# Patient Record
Sex: Female | Born: 1960 | Race: Black or African American | Hispanic: No | Marital: Single | Smoking: Current some day smoker
Health system: Southern US, Community
[De-identification: ages and names within clinical notes are randomized; demographics above are authoritative.]

## PROBLEM LIST (undated history)

## (undated) DIAGNOSIS — K5909 Other constipation: Secondary | ICD-10-CM

## (undated) DIAGNOSIS — Z9889 Other specified postprocedural states: Secondary | ICD-10-CM

## (undated) DIAGNOSIS — J449 Chronic obstructive pulmonary disease, unspecified: Secondary | ICD-10-CM

## (undated) DIAGNOSIS — E7800 Pure hypercholesterolemia, unspecified: Secondary | ICD-10-CM

## (undated) DIAGNOSIS — I10 Essential (primary) hypertension: Secondary | ICD-10-CM

## (undated) DIAGNOSIS — E781 Pure hyperglyceridemia: Secondary | ICD-10-CM

## (undated) DIAGNOSIS — K219 Gastro-esophageal reflux disease without esophagitis: Secondary | ICD-10-CM

## (undated) DIAGNOSIS — E559 Vitamin D deficiency, unspecified: Secondary | ICD-10-CM

## (undated) DIAGNOSIS — Z973 Presence of spectacles and contact lenses: Secondary | ICD-10-CM

## (undated) DIAGNOSIS — J45909 Unspecified asthma, uncomplicated: Secondary | ICD-10-CM

## (undated) HISTORY — PX: TONSILLECTOMY: SUR1361

## (undated) HISTORY — DX: Pure hyperglyceridemia: E78.1

## (undated) HISTORY — DX: Gastro-esophageal reflux disease without esophagitis: K21.9

## (undated) HISTORY — DX: Vitamin D deficiency, unspecified: E55.9

## (undated) HISTORY — PX: COLONOSCOPY: SHX174

## (undated) HISTORY — DX: Unspecified asthma, uncomplicated: J45.909

## (undated) HISTORY — DX: Chronic obstructive pulmonary disease, unspecified: J44.9

---

## 1997-09-27 ENCOUNTER — Emergency Department (HOSPITAL_COMMUNITY): Admission: EM | Admit: 1997-09-27 | Discharge: 1997-09-27 | Payer: Self-pay | Admitting: Emergency Medicine

## 1997-10-27 ENCOUNTER — Emergency Department (HOSPITAL_COMMUNITY): Admission: EM | Admit: 1997-10-27 | Discharge: 1997-10-27 | Payer: Self-pay | Admitting: Emergency Medicine

## 1999-02-15 HISTORY — PX: ORBITAL FRACTURE SURGERY: SHX725

## 1999-06-16 ENCOUNTER — Encounter: Payer: Self-pay | Admitting: Emergency Medicine

## 1999-06-16 ENCOUNTER — Emergency Department (HOSPITAL_COMMUNITY): Admission: EM | Admit: 1999-06-16 | Discharge: 1999-06-16 | Payer: Self-pay | Admitting: Emergency Medicine

## 2000-01-19 ENCOUNTER — Encounter: Payer: Self-pay | Admitting: Emergency Medicine

## 2000-01-19 ENCOUNTER — Emergency Department (HOSPITAL_COMMUNITY): Admission: EM | Admit: 2000-01-19 | Discharge: 2000-01-19 | Payer: Self-pay | Admitting: Emergency Medicine

## 2000-07-19 ENCOUNTER — Emergency Department (HOSPITAL_COMMUNITY): Admission: EM | Admit: 2000-07-19 | Discharge: 2000-07-19 | Payer: Self-pay | Admitting: *Deleted

## 2000-08-26 ENCOUNTER — Emergency Department (HOSPITAL_COMMUNITY): Admission: EM | Admit: 2000-08-26 | Discharge: 2000-08-26 | Payer: Self-pay

## 2001-05-26 ENCOUNTER — Encounter: Payer: Self-pay | Admitting: Emergency Medicine

## 2001-05-26 ENCOUNTER — Emergency Department (HOSPITAL_COMMUNITY): Admission: EM | Admit: 2001-05-26 | Discharge: 2001-05-27 | Payer: Self-pay | Admitting: Emergency Medicine

## 2001-05-27 ENCOUNTER — Encounter: Payer: Self-pay | Admitting: Emergency Medicine

## 2001-10-06 ENCOUNTER — Emergency Department (HOSPITAL_COMMUNITY): Admission: EM | Admit: 2001-10-06 | Discharge: 2001-10-06 | Payer: Self-pay | Admitting: Emergency Medicine

## 2001-10-06 ENCOUNTER — Encounter: Payer: Self-pay | Admitting: Emergency Medicine

## 2002-03-06 ENCOUNTER — Encounter: Payer: Self-pay | Admitting: Emergency Medicine

## 2002-03-06 ENCOUNTER — Emergency Department (HOSPITAL_COMMUNITY): Admission: EM | Admit: 2002-03-06 | Discharge: 2002-03-06 | Payer: Self-pay | Admitting: Emergency Medicine

## 2003-03-12 ENCOUNTER — Emergency Department (HOSPITAL_COMMUNITY): Admission: AD | Admit: 2003-03-12 | Discharge: 2003-03-12 | Payer: Self-pay | Admitting: Emergency Medicine

## 2005-01-29 ENCOUNTER — Emergency Department (HOSPITAL_COMMUNITY): Admission: EM | Admit: 2005-01-29 | Discharge: 2005-01-29 | Payer: Self-pay | Admitting: Emergency Medicine

## 2005-02-04 ENCOUNTER — Emergency Department (HOSPITAL_COMMUNITY): Admission: EM | Admit: 2005-02-04 | Discharge: 2005-02-04 | Payer: Self-pay | Admitting: *Deleted

## 2005-02-05 ENCOUNTER — Emergency Department (HOSPITAL_COMMUNITY): Admission: EM | Admit: 2005-02-05 | Discharge: 2005-02-05 | Payer: Self-pay | Admitting: *Deleted

## 2005-02-09 ENCOUNTER — Emergency Department (HOSPITAL_COMMUNITY): Admission: EM | Admit: 2005-02-09 | Discharge: 2005-02-10 | Payer: Self-pay | Admitting: Emergency Medicine

## 2005-02-18 ENCOUNTER — Emergency Department (HOSPITAL_COMMUNITY): Admission: EM | Admit: 2005-02-18 | Discharge: 2005-02-18 | Payer: Self-pay | Admitting: Emergency Medicine

## 2005-03-30 ENCOUNTER — Encounter: Admission: RE | Admit: 2005-03-30 | Discharge: 2005-03-30 | Payer: Self-pay | Admitting: Otolaryngology

## 2005-04-07 ENCOUNTER — Emergency Department (HOSPITAL_COMMUNITY): Admission: EM | Admit: 2005-04-07 | Discharge: 2005-04-07 | Payer: Self-pay | Admitting: Emergency Medicine

## 2005-12-28 ENCOUNTER — Emergency Department (HOSPITAL_COMMUNITY): Admission: EM | Admit: 2005-12-28 | Discharge: 2005-12-28 | Payer: Self-pay | Admitting: Emergency Medicine

## 2006-06-22 ENCOUNTER — Emergency Department (HOSPITAL_COMMUNITY): Admission: EM | Admit: 2006-06-22 | Discharge: 2006-06-22 | Payer: Self-pay | Admitting: Emergency Medicine

## 2006-09-18 ENCOUNTER — Emergency Department (HOSPITAL_COMMUNITY): Admission: EM | Admit: 2006-09-18 | Discharge: 2006-09-18 | Payer: Self-pay | Admitting: Emergency Medicine

## 2007-04-25 ENCOUNTER — Emergency Department (HOSPITAL_COMMUNITY): Admission: EM | Admit: 2007-04-25 | Discharge: 2007-04-25 | Payer: Self-pay | Admitting: Emergency Medicine

## 2008-04-28 ENCOUNTER — Encounter: Admission: RE | Admit: 2008-04-28 | Discharge: 2008-04-28 | Payer: Self-pay | Admitting: Internal Medicine

## 2008-05-05 ENCOUNTER — Encounter: Admission: RE | Admit: 2008-05-05 | Discharge: 2008-05-05 | Payer: Self-pay | Admitting: Internal Medicine

## 2008-10-22 ENCOUNTER — Encounter (INDEPENDENT_AMBULATORY_CARE_PROVIDER_SITE_OTHER): Payer: Self-pay | Admitting: Obstetrics

## 2008-10-22 ENCOUNTER — Ambulatory Visit (HOSPITAL_COMMUNITY): Admission: RE | Admit: 2008-10-22 | Discharge: 2008-10-22 | Payer: Self-pay | Admitting: Obstetrics

## 2009-11-20 ENCOUNTER — Encounter: Admission: RE | Admit: 2009-11-20 | Discharge: 2009-11-20 | Payer: Self-pay | Admitting: Family Medicine

## 2009-12-04 ENCOUNTER — Emergency Department (HOSPITAL_COMMUNITY): Admission: EM | Admit: 2009-12-04 | Discharge: 2009-12-04 | Payer: Self-pay | Admitting: Emergency Medicine

## 2010-01-27 ENCOUNTER — Emergency Department (HOSPITAL_COMMUNITY)
Admission: EM | Admit: 2010-01-27 | Discharge: 2010-01-27 | Payer: Self-pay | Source: Home / Self Care | Admitting: Emergency Medicine

## 2010-02-27 ENCOUNTER — Emergency Department (HOSPITAL_COMMUNITY)
Admission: EM | Admit: 2010-02-27 | Discharge: 2010-02-27 | Payer: Self-pay | Source: Home / Self Care | Admitting: Emergency Medicine

## 2010-03-15 ENCOUNTER — Emergency Department (HOSPITAL_COMMUNITY)
Admission: EM | Admit: 2010-03-15 | Discharge: 2010-03-16 | Payer: Self-pay | Source: Home / Self Care | Admitting: Emergency Medicine

## 2010-05-21 LAB — CBC
HCT: 31 % — ABNORMAL LOW (ref 36.0–46.0)
Hemoglobin: 10.6 g/dL — ABNORMAL LOW (ref 12.0–15.0)
RBC: 3.61 MIL/uL — ABNORMAL LOW (ref 3.87–5.11)

## 2010-06-23 ENCOUNTER — Other Ambulatory Visit: Payer: Self-pay | Admitting: Family Medicine

## 2010-06-23 ENCOUNTER — Ambulatory Visit
Admission: RE | Admit: 2010-06-23 | Discharge: 2010-06-23 | Disposition: A | Discharge status: Home / Self Care | Payer: Medicaid Other | Source: Ambulatory Visit | Attending: Family Medicine | Admitting: Family Medicine

## 2010-06-23 DIAGNOSIS — R1084 Generalized abdominal pain: Secondary | ICD-10-CM

## 2010-06-23 DIAGNOSIS — K5900 Constipation, unspecified: Secondary | ICD-10-CM

## 2010-06-23 DIAGNOSIS — R634 Abnormal weight loss: Secondary | ICD-10-CM

## 2010-06-23 DIAGNOSIS — K6289 Other specified diseases of anus and rectum: Secondary | ICD-10-CM

## 2010-10-23 ENCOUNTER — Emergency Department (HOSPITAL_COMMUNITY): Payer: Medicaid Other

## 2010-10-23 ENCOUNTER — Emergency Department (HOSPITAL_COMMUNITY)
Admission: EM | Admit: 2010-10-23 | Discharge: 2010-10-23 | Disposition: A | Discharge status: Home / Self Care | Payer: Medicaid Other | Attending: Emergency Medicine | Admitting: Emergency Medicine

## 2010-10-23 DIAGNOSIS — R071 Chest pain on breathing: Secondary | ICD-10-CM | POA: Insufficient documentation

## 2010-10-23 DIAGNOSIS — M549 Dorsalgia, unspecified: Secondary | ICD-10-CM | POA: Insufficient documentation

## 2010-10-23 DIAGNOSIS — F411 Generalized anxiety disorder: Secondary | ICD-10-CM | POA: Insufficient documentation

## 2010-10-23 DIAGNOSIS — G8929 Other chronic pain: Secondary | ICD-10-CM | POA: Insufficient documentation

## 2010-10-23 LAB — POCT I-STAT TROPONIN I: Troponin i, poc: 0 ng/mL (ref 0.00–0.08)

## 2010-10-23 LAB — DIFFERENTIAL
Basophils Absolute: 0 10*3/uL (ref 0.0–0.1)
Basophils Relative: 0 % (ref 0–1)
Eosinophils Absolute: 0.1 10*3/uL (ref 0.0–0.7)
Eosinophils Relative: 1 % (ref 0–5)
Neutrophils Relative %: 60 % (ref 43–77)

## 2010-10-23 LAB — D-DIMER, QUANTITATIVE: D-Dimer, Quant: 0.28 ug/mL-FEU (ref 0.00–0.48)

## 2010-10-23 LAB — CBC
Platelets: 260 10*3/uL (ref 150–400)
RDW: 15 % (ref 11.5–15.5)
WBC: 6 10*3/uL (ref 4.0–10.5)

## 2010-10-23 LAB — POCT I-STAT, CHEM 8
Calcium, Ion: 1.16 mmol/L (ref 1.12–1.32)
Glucose, Bld: 95 mg/dL (ref 70–99)
HCT: 36 % (ref 36.0–46.0)
Hemoglobin: 12.2 g/dL (ref 12.0–15.0)

## 2010-11-08 LAB — POCT CARDIAC MARKERS
CKMB, poc: <1 — ABNORMAL LOW
Myoglobin, poc: 26.8
Troponin i, poc: <0.05

## 2010-11-08 LAB — I-STAT 8, (EC8 V) (CONVERTED LAB)
Acid-base deficit: 4 — ABNORMAL HIGH
Bicarbonate: 20.4
HCT: 38
Operator id: 261381
pCO2, Ven: 33.6 — ABNORMAL LOW
pH, Ven: 7.393 — ABNORMAL HIGH

## 2010-11-08 LAB — D-DIMER, QUANTITATIVE: D-Dimer, Quant: <0.22

## 2010-11-08 LAB — POCT I-STAT CREATININE: Creatinine, Ser: 0.6

## 2010-11-08 LAB — ETHANOL: Alcohol, Ethyl (B): 256 — ABNORMAL HIGH

## 2010-11-15 ENCOUNTER — Other Ambulatory Visit: Payer: Self-pay | Admitting: Gastroenterology

## 2010-11-17 ENCOUNTER — Ambulatory Visit
Admission: RE | Admit: 2010-11-17 | Discharge: 2010-11-17 | Disposition: A | Discharge status: Home / Self Care | Payer: Medicaid Other | Source: Ambulatory Visit | Attending: Gastroenterology | Admitting: Gastroenterology

## 2010-11-17 MED ORDER — IOHEXOL 300 MG/ML  SOLN
75.0000 mL | Freq: Once | INTRAMUSCULAR | Status: AC | PRN
  Administered 2010-11-17: 75 mL via INTRAVENOUS

## 2010-11-20 ENCOUNTER — Emergency Department (HOSPITAL_COMMUNITY)
Admission: EM | Admit: 2010-11-20 | Discharge: 2010-11-20 | Discharge status: Against Medical Advice | Payer: Medicaid Other | Attending: Emergency Medicine | Admitting: Emergency Medicine

## 2010-11-20 DIAGNOSIS — R0789 Other chest pain: Secondary | ICD-10-CM | POA: Insufficient documentation

## 2010-11-22 ENCOUNTER — Other Ambulatory Visit: Payer: Self-pay | Admitting: Gastroenterology

## 2011-02-15 DIAGNOSIS — Z9889 Other specified postprocedural states: Secondary | ICD-10-CM

## 2011-02-15 HISTORY — DX: Other specified postprocedural states: Z98.890

## 2011-06-13 ENCOUNTER — Emergency Department (HOSPITAL_COMMUNITY)
Admission: EM | Admit: 2011-06-13 | Discharge: 2011-06-13 | Disposition: A | Discharge status: Home / Self Care | Payer: Medicaid Other | Attending: Emergency Medicine | Admitting: Emergency Medicine

## 2011-06-13 ENCOUNTER — Encounter (HOSPITAL_COMMUNITY): Payer: Self-pay | Admitting: Emergency Medicine

## 2011-06-13 ENCOUNTER — Emergency Department (HOSPITAL_COMMUNITY): Payer: Medicaid Other

## 2011-06-13 DIAGNOSIS — R0789 Other chest pain: Secondary | ICD-10-CM | POA: Insufficient documentation

## 2011-06-13 DIAGNOSIS — F3289 Other specified depressive episodes: Secondary | ICD-10-CM | POA: Insufficient documentation

## 2011-06-13 DIAGNOSIS — F329 Major depressive disorder, single episode, unspecified: Secondary | ICD-10-CM | POA: Insufficient documentation

## 2011-06-13 LAB — COMPREHENSIVE METABOLIC PANEL
ALT: 23 U/L (ref 0–35)
AST: 47 U/L — ABNORMAL HIGH (ref 0–37)
Albumin: 3.9 g/dL (ref 3.5–5.2)
Alkaline Phosphatase: 82 U/L (ref 39–117)
Calcium: 9.6 mg/dL (ref 8.4–10.5)
GFR calc Af Amer: >90 mL/min (ref >90)
Glucose, Bld: 82 mg/dL (ref 70–99)
Potassium: 3.5 mEq/L (ref 3.5–5.1)
Sodium: 135 mEq/L (ref 135–145)
Total Protein: 8.4 g/dL — ABNORMAL HIGH (ref 6.0–8.3)

## 2011-06-13 LAB — DIFFERENTIAL
Basophils Relative: 1 % (ref 0–1)
Eosinophils Absolute: 0.1 10*3/uL (ref 0.0–0.7)
Monocytes Relative: 11 % (ref 3–12)
Neutro Abs: 2.6 10*3/uL (ref 1.7–7.7)
Neutrophils Relative %: 47 % (ref 43–77)

## 2011-06-13 LAB — CBC
Hemoglobin: 11 g/dL — ABNORMAL LOW (ref 12.0–15.0)
MCH: 26.9 pg (ref 26.0–34.0)
MCHC: 32.1 g/dL (ref 30.0–36.0)
Platelets: 289 10*3/uL (ref 150–400)
RBC: 4.09 MIL/uL (ref 3.87–5.11)

## 2011-06-13 LAB — TROPONIN I: Troponin I: <0.3 ng/mL (ref <0.30)

## 2011-06-13 MED ORDER — METHOCARBAMOL 500 MG PO TABS
500.0000 mg | ORAL_TABLET | Freq: Once | ORAL | Status: AC
  Administered 2011-06-13: 500 mg via ORAL
  Filled 2011-06-13: qty 1

## 2011-06-13 MED ORDER — DICLOFENAC SODIUM 75 MG PO TBEC: 75.0000 mg | DELAYED_RELEASE_TABLET | Freq: Two times a day (BID) | ORAL | Status: DC

## 2011-06-13 MED ORDER — KETOROLAC TROMETHAMINE 60 MG/2ML IM SOLN
60.0000 mg | Freq: Once | INTRAMUSCULAR | Status: DC
  Filled 2011-06-13: qty 2

## 2011-06-13 MED ORDER — DEXAMETHASONE SODIUM PHOSPHATE 10 MG/ML IJ SOLN
10.0000 mg | Freq: Once | INTRAMUSCULAR | Status: DC
  Filled 2011-06-13: qty 1

## 2011-06-13 MED ORDER — CYCLOBENZAPRINE HCL 10 MG PO TABS: 10.0000 mg | ORAL_TABLET | Freq: Two times a day (BID) | ORAL | Status: AC | PRN

## 2011-06-13 NOTE — ED Provider Notes (Addendum)
History     CSN: [id number]  Arrival date & time 06/13/11  1403   First MD Initiated Contact with Patient 06/13/11 1553     4:15 PM HPI Patient reports chest pain that began 3 days ago while watching TV. States pain has been constant for 3 days. Describes pain as left-sided, sharp, stabbing pains. Reports symptoms associated with shortness of breath. States chest pain is worse with palpation. Reports pain radiates to her left arm and left lower extremity. Patient has a history smoking. Denies history of hypertension, hyperlipidemia, family history of early MI, diabetes, recent travel, recent surgery, history of PE or DVT.  Patient is a 51 y.o. female presenting with chest pain. The history is provided by the patient.  Chest Pain Episode onset: 3 days ago. Chest pain occurs constantly. The chest pain is worsening. The severity of the pain is severe. The quality of the pain is described as sharp and stabbing. Radiates to: Left arm and left leg. Exacerbated by: palpation. Primary symptoms include shortness of breath. Pertinent negatives for primary symptoms include no fever, no fatigue, no cough, no wheezing, no palpitations, no abdominal pain, no nausea, no vomiting, no dizziness and no altered mental status.  Pertinent negatives for associated symptoms include no claudication, no diaphoresis, no lower extremity edema, no near-syncope, no numbness, no orthopnea and no weakness. She tried nothing for the symptoms. Risk factors include smoking/tobacco exposure.  Pertinent negatives for past medical history include no CHF, no diabetes, no DVT, no hyperlipidemia, no hypertension, no MI, no PE, no strokes and no TIA.  Pertinent negatives for family medical history include: no early MI in family.     Past Medical History  Diagnosis Date  . Depression     History reviewed. No pertinent past surgical history.  History reviewed. No pertinent family history.  History  Substance Use Topics  .  Smoking status: Current Everyday Smoker  . Smokeless tobacco: Not on file  . Alcohol Use: Yes    OB History    Grav Para Term Preterm Abortions TAB SAB Ect Mult Living                  Review of Systems  Constitutional: Negative for fever, diaphoresis and fatigue.  HENT: Negative for neck pain.   Respiratory: Positive for shortness of breath. Negative for cough and wheezing.   Cardiovascular: Positive for chest pain. Negative for palpitations, orthopnea, claudication, leg swelling and near-syncope.  Gastrointestinal: Negative for nausea, vomiting and abdominal pain.  Neurological: Negative for dizziness, weakness, numbness and headaches.  Psychiatric/Behavioral: Negative for altered mental status.  All other systems reviewed and are negative.    Allergies  Review of patient's allergies indicates no known allergies.  Home Medications   Current Outpatient Rx  Name Route Sig Dispense Refill  . ASPIRIN-ACETAMINOPHEN-CAFFEINE 250-250-65 MG PO TABS Oral Take 1 tablet by mouth every 6 (six) hours as needed. For migraine      BP 131/72  Pulse 80  Temp 98.2 F (36.8 C)  Resp 22  SpO2 100%  Physical Exam  Vitals reviewed. Constitutional: She is oriented to person, place, and time. Vital signs are normal. She appears well-developed and well-nourished.  HENT:  Head: Normocephalic and atraumatic.  Eyes: Conjunctivae are normal. Pupils are equal, round, and reactive to light.  Neck: Normal range of motion. Neck supple.  Cardiovascular: Normal rate, regular rhythm and normal heart sounds.  Exam reveals no friction rub.   No murmur heard. Pulmonary/Chest: Effort normal  and breath sounds normal. She has no wheezes. She has no rhonchi. She has no rales. She exhibits tenderness.       Reproducible chest pain with palpation of entire left chest and shoulder. Otherwise normal exam.  Musculoskeletal: Normal range of motion.  Neurological: She is alert and oriented to person, place, and  time. Coordination normal.  Skin: Skin is warm and dry. No rash noted. No erythema. No pallor.    ED Course  Procedures   Results for orders placed during the hospital encounter of 06/13/11  CBC      Component Value Range   WBC 5.5  4.0 - 10.5 (K/uL)   RBC 4.09  3.87 - 5.11 (MIL/uL)   Hemoglobin 11.0 (*) 12.0 - 15.0 (g/dL)   HCT 40.9 (*) 81.1 - 46.0 (%)   MCV 83.9  78.0 - 100.0 (fL)   MCH 26.9  26.0 - 34.0 (pg)   MCHC 32.1  30.0 - 36.0 (g/dL)   RDW 91.4 (*) 78.2 - 15.5 (%)   Platelets 289  150 - 400 (K/uL)  DIFFERENTIAL      Component Value Range   Neutrophils Relative 47  43 - 77 (%)   Neutro Abs 2.6  1.7 - 7.7 (K/uL)   Lymphocytes Relative 41  12 - 46 (%)   Lymphs Abs 2.3  0.7 - 4.0 (K/uL)   Monocytes Relative 11  3 - 12 (%)   Monocytes Absolute 0.6  0.1 - 1.0 (K/uL)   Eosinophils Relative 1  0 - 5 (%)   Eosinophils Absolute 0.1  0.0 - 0.7 (K/uL)   Basophils Relative 1  0 - 1 (%)   Basophils Absolute 0.0  0.0 - 0.1 (K/uL)  TROPONIN I      Component Value Range   Troponin I <0.30  <0.30 (ng/mL)  COMPREHENSIVE METABOLIC PANEL      Component Value Range   Sodium 135  135 - 145 (mEq/L)   Potassium 3.5  3.5 - 5.1 (mEq/L)   Chloride 99  96 - 112 (mEq/L)   CO2 22  19 - 32 (mEq/L)   Glucose, Bld 82  70 - 99 (mg/dL)   BUN 5 (*) 6 - 23 (mg/dL)   Creatinine, Ser 9.56 (*) 0.50 - 1.10 (mg/dL)   Calcium 9.6  8.4 - 21.3 (mg/dL)   Total Protein 8.4 (*) 6.0 - 8.3 (g/dL)   Albumin 3.9  3.5 - 5.2 (g/dL)   AST 47 (*) 0 - 37 (U/L)   ALT 23  0 - 35 (U/L)   Alkaline Phosphatase 82  39 - 117 (U/L)   Total Bilirubin 0.3  0.3 - 1.2 (mg/dL)   GFR calc non Af Amer >90  >90 (mL/min)   GFR calc Af Amer >90  >90 (mL/min)   Dg Chest 2 View  06/13/2011  *RADIOLOGY REPORT*  Clinical Data: Left-sided chest pain and shortness of breath for the past 3 days.  Left arm numbness as well.  CHEST - 2 VIEW  Comparison: Chest x-ray 10/23/2010.  Findings: Lungs are mildly hyperexpanded with flattening of  the hemidiaphragms and increased retrosternal air space.  No focal airspace consolidation.  No definite pleural effusions.  Pulmonary vasculature and the cardiomediastinal silhouette are within normal limits.  IMPRESSION: 1.  Mild hyperexpansion without other acute findings.  This is nonspecific, but can be seen in the setting of reactive airway disease.  Original Report Authenticated By: Florencia Reasons, M.D.     Date: 06/13/2011  Rate: 78  Rhythm: normal sinus rhythm  QRS Axis: normal  Intervals: normal  ST/T Wave abnormalities: normal  Conduction Disutrbances: none   Old EKG Reviewed: No significant changes noted     MDM   Patient reports constant pain for the last 3 days. Vital signs are stable. The patient does not have risk factors for ACS patient is also Perc negative suspect pain is musculoskeletal since patient's pain is severe he easily reproducible with palpation. I attempted to discuss this with patient but patient does not want to accept that her labs, EKG, and x-ray were normal advised patient that we will treat her with Toradol, Decadron and Robaxin. Will give her similar medications to go home on, diclofenac, and Robaxin. Reviewed prior chart for patient, which states she is allergic to ibuprofen but states she has taken Aleve in the past. He has had prescription for naproxen. Patient is ready for discharge. Discussed plan with Dr. Lowella Bandy, PA-C 06/13/11 1714  Thomasene Lot, PA-C 06/13/11 [phone]

## 2011-06-13 NOTE — ED Provider Notes (Signed)
Medical screening examination/treatment/procedure(s) were performed by non-physician practitioner and as supervising physician I was immediately available for consultation/collaboration.  Ethelda Chick, MD 06/13/11 1725

## 2011-06-13 NOTE — ED Provider Notes (Signed)
Medical screening examination/treatment/procedure(s) were performed by non-physician practitioner and as supervising physician I was immediately available for consultation/collaboration.  Ethelda Chick, MD 06/13/11 1911

## 2011-06-13 NOTE — ED Notes (Signed)
Chest pain left sided that comes and goes hurts down to leg and ankle

## 2011-06-13 NOTE — ED Notes (Signed)
Pt reports pain on the (L) side x 3 days.  States that it is worsening today.  Pt reports swelling on the (L) side also, no swelling noted.  Pt reports tenderness on the (L) side-chest, neck arm and leg.  Denies pain in ankle and foot.  CVA screen negative, no drift, droop noted, PERRLA.

## 2011-06-13 NOTE — Discharge Instructions (Signed)
Chest Pain (Nonspecific) It is often hard to give a specific diagnosis for the cause of chest pain. There is always a chance that your pain could be related to something serious, such as a heart attack or a blood clot in the lungs. You need to follow up with your caregiver for further evaluation. CAUSES   Heartburn.   Pneumonia or bronchitis.   Anxiety or stress.   Inflammation around your heart (pericarditis) or lung (pleuritis or pleurisy).   A blood clot in the lung.   A collapsed lung (pneumothorax). It can develop suddenly on its own (spontaneous pneumothorax) or from injury (trauma) to the chest.   Shingles infection (herpes zoster virus).  The chest wall is composed of bones, muscles, and cartilage. Any of these can be the source of the pain.  The bones can be bruised by injury.   The muscles or cartilage can be strained by coughing or overwork.   The cartilage can be affected by inflammation and become sore (costochondritis).  DIAGNOSIS  Lab tests or other studies, such as X-rays, electrocardiography, stress testing, or cardiac imaging, may be needed to find the cause of your pain.  TREATMENT   Treatment depends on what may be causing your chest pain. Treatment may include:   Acid blockers for heartburn.   Anti-inflammatory medicine.   Pain medicine for inflammatory conditions.   Antibiotics if an infection is present.   You may be advised to change lifestyle habits. This includes stopping smoking and avoiding alcohol, caffeine, and chocolate.   You may be advised to keep your head raised (elevated) when sleeping. This reduces the chance of acid going backward from your stomach into your esophagus.   Most of the time, nonspecific chest pain will improve within 2 to 3 days with rest and mild pain medicine.  HOME CARE INSTRUCTIONS   If antibiotics were prescribed, take your antibiotics as directed. Finish them even if you start to feel better.   For the next few  days, avoid physical activities that bring on chest pain. Continue physical activities as directed.   Do not smoke.   Avoid drinking alcohol.   Only take over-the-counter or prescription medicine for pain, discomfort, or fever as directed by your caregiver.   Follow your caregiver's suggestions for further testing if your chest pain does not go away.   Keep any follow-up appointments you made. If you do not go to an appointment, you could develop lasting (chronic) problems with pain. If there is any problem keeping an appointment, you must call to reschedule.  SEEK MEDICAL CARE IF:   You think you are having problems from the medicine you are taking. Read your medicine instructions carefully.   Your chest pain does not go away, even after treatment.   You develop a rash with blisters on your chest.  SEEK IMMEDIATE MEDICAL CARE IF:   You have increased chest pain or pain that spreads to your arm, neck, jaw, back, or abdomen.   You develop shortness of breath, an increasing cough, or you are coughing up blood.   You have severe back or abdominal pain, feel nauseous, or vomit.   You develop severe weakness, fainting, or chills.   You have a fever.  THIS IS AN EMERGENCY. Do not wait to see if the pain will go away. Get medical help at once. Call your local emergency services (911 in [place] Do not drive yourself to the hospital. MAKE SURE YOU:   Understand these instructions.     Will watch your condition.   Will get help right away if you are not doing well or get worse.  Document Released: 11/10/2004 Document Revised: 01/20/2011 Document Reviewed: 09/06/2007 [employer] Patient Information 2012 [employer]. 

## 2011-11-02 ENCOUNTER — Ambulatory Visit (HOSPITAL_COMMUNITY)
Admission: RE | Admit: 2011-11-02 | Discharge: 2011-11-02 | Disposition: A | Discharge status: Home / Self Care | Payer: Medicaid Other | Source: Ambulatory Visit | Attending: Internal Medicine | Admitting: Internal Medicine

## 2011-11-02 ENCOUNTER — Other Ambulatory Visit (HOSPITAL_COMMUNITY): Payer: Self-pay | Admitting: Internal Medicine

## 2011-11-02 DIAGNOSIS — M545 Low back pain, unspecified: Secondary | ICD-10-CM | POA: Insufficient documentation

## 2011-11-02 DIAGNOSIS — M25559 Pain in unspecified hip: Secondary | ICD-10-CM | POA: Insufficient documentation

## 2011-11-02 DIAGNOSIS — R52 Pain, unspecified: Secondary | ICD-10-CM

## 2011-12-21 ENCOUNTER — Other Ambulatory Visit: Payer: Self-pay | Admitting: Internal Medicine

## 2011-12-21 DIAGNOSIS — N63 Unspecified lump in unspecified breast: Secondary | ICD-10-CM

## 2011-12-23 ENCOUNTER — Emergency Department (HOSPITAL_COMMUNITY)
Admission: EM | Admit: 2011-12-23 | Discharge: 2011-12-23 | Disposition: A | Discharge status: Home / Self Care | Payer: Medicaid Other | Attending: Emergency Medicine | Admitting: Emergency Medicine

## 2011-12-23 ENCOUNTER — Encounter (HOSPITAL_COMMUNITY): Payer: Self-pay | Admitting: Emergency Medicine

## 2011-12-23 ENCOUNTER — Emergency Department (HOSPITAL_COMMUNITY): Payer: Medicaid Other

## 2011-12-23 DIAGNOSIS — Z8659 Personal history of other mental and behavioral disorders: Secondary | ICD-10-CM | POA: Insufficient documentation

## 2011-12-23 DIAGNOSIS — R52 Pain, unspecified: Secondary | ICD-10-CM

## 2011-12-23 DIAGNOSIS — F17200 Nicotine dependence, unspecified, uncomplicated: Secondary | ICD-10-CM | POA: Insufficient documentation

## 2011-12-23 DIAGNOSIS — Z79899 Other long term (current) drug therapy: Secondary | ICD-10-CM | POA: Insufficient documentation

## 2011-12-23 DIAGNOSIS — R079 Chest pain, unspecified: Secondary | ICD-10-CM | POA: Insufficient documentation

## 2011-12-23 LAB — CBC
HCT: 34.6 % — ABNORMAL LOW (ref 36.0–46.0)
Hemoglobin: 11.3 g/dL — ABNORMAL LOW (ref 12.0–15.0)
MCHC: 32.7 g/dL (ref 30.0–36.0)
MCV: 85.2 fL (ref 78.0–100.0)
RDW: 14.3 % (ref 11.5–15.5)

## 2011-12-23 LAB — BASIC METABOLIC PANEL
BUN: 7 mg/dL (ref 6–23)
Chloride: 100 mEq/L (ref 96–112)
Creatinine, Ser: 0.85 mg/dL (ref 0.50–1.10)
GFR calc Af Amer: >90 mL/min (ref >90)
Glucose, Bld: 97 mg/dL (ref 70–99)

## 2011-12-23 MED ORDER — OXYCODONE-ACETAMINOPHEN 5-325 MG PO TABS: 1.0000 | ORAL_TABLET | Freq: Four times a day (QID) | ORAL | Status: DC | PRN

## 2011-12-23 MED ORDER — OXYCODONE-ACETAMINOPHEN 5-325 MG PO TABS
1.0000 | ORAL_TABLET | Freq: Once | ORAL | Status: AC
  Administered 2011-12-23: 1 via ORAL
  Filled 2011-12-23: qty 1

## 2011-12-23 NOTE — ED Notes (Signed)
Meal tray given to patient.  Awaiting discharge.

## 2011-12-23 NOTE — ED Notes (Signed)
Pt resting quietly with family in waiting room. No active distress at this time. Pt informed of wait times and verbalizes understanding.

## 2011-12-23 NOTE — ED Notes (Signed)
Pt c/o left sided CP into arm and shoulder that is worse with inspiration and palpation x 3 days; pt denies SOB

## 2011-12-23 NOTE — ED Notes (Signed)
Patient states "no chest pain at present".  Hurts more in epigastric area and after eating.

## 2011-12-23 NOTE — ED Provider Notes (Signed)
History     CSN: [id number]  Arrival date & time 12/23/11  1126   First MD Initiated Contact with Patient 12/23/11 1330      Chief Complaint  Patient presents with  . Chest Pain    (Consider location/radiation/quality/duration/timing/severity/associated sxs/prior treatment) Patient is a 51 y.o. female presenting with chest pain. The history is provided by the patient.  Chest Pain Pertinent negatives for primary symptoms include no shortness of breath, no abdominal pain, no nausea and no vomiting.  Pertinent negatives for associated symptoms include no numbness and no weakness.    patient presents with left-sided pain. She's had a for the last 3 days. Worse with inspiration and movement. It is in his left shoulder left abdomen left lower extremity left arm. She's been seen for the same previously.  Past Medical History  Diagnosis Date  . Depression     History reviewed. No pertinent past surgical history.  History reviewed. No pertinent family history.  History  Substance Use Topics  . Smoking status: Current Every Day Smoker  . Smokeless tobacco: Not on file  . Alcohol Use: Yes    OB History    Grav Para Term Preterm Abortions TAB SAB Ect Mult Living                  Review of Systems  Constitutional: Negative for activity change and appetite change.  HENT: Negative for neck stiffness.   Eyes: Negative for pain.  Respiratory: Negative for chest tightness and shortness of breath.   Cardiovascular: Positive for chest pain. Negative for leg swelling.  Gastrointestinal: Negative for nausea, vomiting, abdominal pain and diarrhea.  Genitourinary: Negative for flank pain.  Musculoskeletal: Negative for back pain.  Skin: Negative for rash.  Neurological: Negative for weakness, numbness and headaches.  Psychiatric/Behavioral: Negative for behavioral problems.    Allergies  Review of patient's allergies indicates no known allergies.  Home Medications   Current  Outpatient Rx  Name  Route  Sig  Dispense  Refill  . DICLOFENAC SODIUM 75 MG PO TBEC   Oral   Take 1 tablet (75 mg total) by mouth 2 (two) times daily.   30 tablet   0   . DIPHENOXYLATE-ATROPINE 2.5-0.025 MG PO TABS   Oral   Take 1 tablet by mouth at bedtime as needed. For stomach issues         . HYDROCODONE-ACETAMINOPHEN 10-325 MG PO TABS   Oral   Take 1 tablet by mouth every 8 (eight) hours as needed. For pain         . SIMVASTATIN 40 MG PO TABS   Oral   Take 40 mg by mouth every evening.         . OXYCODONE-ACETAMINOPHEN 5-325 MG PO TABS   Oral   Take 1-2 tablets by mouth every 6 (six) hours as needed for pain.   10 tablet   0     BP 127/90  Pulse 86  Temp 98.1 F (36.7 C) (Oral)  Resp 19  SpO2 100%  LMP 10/23/2011  Physical Exam  Nursing note and vitals reviewed. Constitutional: She is oriented to person, place, and time. She appears well-developed and well-nourished.  HENT:  Head: Normocephalic and atraumatic.  Eyes: EOM are normal. Pupils are equal, round, and reactive to light.  Neck: Normal range of motion. Neck supple.  Cardiovascular: Normal rate, regular rhythm and normal heart sounds.   No murmur heard. Pulmonary/Chest: Effort normal and breath sounds normal. No respiratory distress.  She has no wheezes. She has no rales. She exhibits tenderness.  Abdominal: Soft. Bowel sounds are normal. She exhibits no distension. There is tenderness. There is no rebound and no guarding.  Musculoskeletal: Normal range of motion.       Is tenderness to left upper chest left lower chest and left abdomen. Good cap refil.  Neurological: She is alert and oriented to person, place, and time. No cranial nerve deficit.  Skin: Skin is warm and dry.  Psychiatric: She has a normal mood and affect. Her speech is normal.    ED Course  Procedures (including critical care time)  Labs Reviewed  CBC - Abnormal; Notable for the following:    Hemoglobin 11.3 (*)     HCT  34.6 (*)     All other components within normal limits  BASIC METABOLIC PANEL - Abnormal; Notable for the following:    GFR calc non Af Amer 79 (*)     All other components within normal limits  POCT I-STAT TROPONIN I   Dg Chest 2 View  12/23/2011  *RADIOLOGY REPORT*  Clinical Data: Left-sided chest pain.  Short of breath.  CHEST - 2 VIEW  Comparison: 06/13/2011  Findings: Heart size is normal.  Mediastinal shadows are normal. There may be mild central bronchial thickening but there is no infiltrate, collapse or effusion.  There is mild chronic spinal curvature.  IMPRESSION: Question mild central bronchial thickening.  No edema, consolidation or collapse.   Original Report Authenticated By: Paulina Fusi, M.D.      1. Pain     Date: 12/23/2011  Rate: 103  Rhythm: normal sinus rhythm  QRS Axis: normal  Intervals: normal  ST/T Wave abnormalities: normal  Conduction Disutrbances:none  Narrative Interpretation:   Old EKG Reviewed: unchanged     MDM  Patient with left-sided chest pain. His been on for 3 days but has had episodes for a year. She states her primary care Dr. that it was from fat. EKG and lab works reassuring. She'll be discharged home to followup with her primary care doctor as planned. Doubt pulmonary embolisms or cardiac cause.        Juliet Rude. Rubin Payor, MD 12/23/11 1550

## 2012-01-02 ENCOUNTER — Ambulatory Visit
Admission: RE | Admit: 2012-01-02 | Discharge: 2012-01-02 | Disposition: A | Discharge status: Home / Self Care | Payer: Medicaid Other | Source: Ambulatory Visit | Attending: Internal Medicine | Admitting: Internal Medicine

## 2012-01-02 DIAGNOSIS — N63 Unspecified lump in unspecified breast: Secondary | ICD-10-CM

## 2012-02-18 ENCOUNTER — Emergency Department (HOSPITAL_COMMUNITY): Payer: Medicaid Other

## 2012-02-18 ENCOUNTER — Encounter (HOSPITAL_COMMUNITY): Payer: Self-pay | Admitting: Neurology

## 2012-02-18 ENCOUNTER — Emergency Department (HOSPITAL_COMMUNITY)
Admission: EM | Admit: 2012-02-18 | Discharge: 2012-02-18 | Disposition: A | Discharge status: Home / Self Care | Payer: Medicaid Other | Attending: Emergency Medicine | Admitting: Emergency Medicine

## 2012-02-18 DIAGNOSIS — F329 Major depressive disorder, single episode, unspecified: Secondary | ICD-10-CM | POA: Insufficient documentation

## 2012-02-18 DIAGNOSIS — F17200 Nicotine dependence, unspecified, uncomplicated: Secondary | ICD-10-CM | POA: Insufficient documentation

## 2012-02-18 DIAGNOSIS — F3289 Other specified depressive episodes: Secondary | ICD-10-CM | POA: Insufficient documentation

## 2012-02-18 DIAGNOSIS — Z7982 Long term (current) use of aspirin: Secondary | ICD-10-CM | POA: Insufficient documentation

## 2012-02-18 DIAGNOSIS — Z79899 Other long term (current) drug therapy: Secondary | ICD-10-CM | POA: Insufficient documentation

## 2012-02-18 DIAGNOSIS — R51 Headache: Secondary | ICD-10-CM | POA: Insufficient documentation

## 2012-02-18 DIAGNOSIS — J019 Acute sinusitis, unspecified: Secondary | ICD-10-CM

## 2012-02-18 DIAGNOSIS — J329 Chronic sinusitis, unspecified: Secondary | ICD-10-CM | POA: Insufficient documentation

## 2012-02-18 DIAGNOSIS — R0789 Other chest pain: Secondary | ICD-10-CM | POA: Insufficient documentation

## 2012-02-18 HISTORY — DX: Other specified postprocedural states: Z98.890

## 2012-02-18 LAB — POCT I-STAT, CHEM 8
Chloride: 105 mEq/L (ref 96–112)
Creatinine, Ser: 0.6 mg/dL (ref 0.50–1.10)
Glucose, Bld: 79 mg/dL (ref 70–99)
HCT: 31 % — ABNORMAL LOW (ref 36.0–46.0)
Potassium: 3.5 mEq/L (ref 3.5–5.1)
Sodium: 142 mEq/L (ref 135–145)

## 2012-02-18 LAB — CBC WITH DIFFERENTIAL/PLATELET
Eosinophils Absolute: 0 10*3/uL (ref 0.0–0.7)
Eosinophils Relative: 1 % (ref 0–5)
HCT: 28.4 % — ABNORMAL LOW (ref 36.0–46.0)
Lymphocytes Relative: 40 % (ref 12–46)
Lymphs Abs: 2.5 10*3/uL (ref 0.7–4.0)
MCH: 26.3 pg (ref 26.0–34.0)
MCV: 84 fL (ref 78.0–100.0)
Monocytes Absolute: 1 10*3/uL (ref 0.1–1.0)
Platelets: 428 10*3/uL — ABNORMAL HIGH (ref 150–400)
RBC: 3.38 MIL/uL — ABNORMAL LOW (ref 3.87–5.11)
RDW: 13.1 % (ref 11.5–15.5)
WBC: 6.4 10*3/uL (ref 4.0–10.5)

## 2012-02-18 LAB — URINE MICROSCOPIC-ADD ON

## 2012-02-18 LAB — RAPID URINE DRUG SCREEN, HOSP PERFORMED
Amphetamines: NOT DETECTED
Opiates: POSITIVE — AB

## 2012-02-18 LAB — URINALYSIS, ROUTINE W REFLEX MICROSCOPIC
Nitrite: NEGATIVE
Protein, ur: NEGATIVE mg/dL
Specific Gravity, Urine: 1.024 (ref 1.005–1.030)
Urobilinogen, UA: 0.2 mg/dL (ref 0.0–1.0)

## 2012-02-18 LAB — COMPREHENSIVE METABOLIC PANEL
CO2: 25 mEq/L (ref 19–32)
Calcium: 9.3 mg/dL (ref 8.4–10.5)
Creatinine, Ser: 0.56 mg/dL (ref 0.50–1.10)
GFR calc Af Amer: >90 mL/min (ref >90)
GFR calc non Af Amer: >90 mL/min (ref >90)
Glucose, Bld: 79 mg/dL (ref 70–99)
Sodium: 141 mEq/L (ref 135–145)
Total Protein: 8.4 g/dL — ABNORMAL HIGH (ref 6.0–8.3)

## 2012-02-18 LAB — POCT I-STAT TROPONIN I

## 2012-02-18 LAB — TROPONIN I: Troponin I: <0.3 ng/mL (ref <0.30)

## 2012-02-18 LAB — ETHANOL: Alcohol, Ethyl (B): <11 mg/dL (ref 0–11)

## 2012-02-18 LAB — PROTIME-INR
INR: 1.09 (ref 0.00–1.49)
Prothrombin Time: 14 seconds (ref 11.6–15.2)

## 2012-02-18 LAB — POCT PREGNANCY, URINE: Preg Test, Ur: NEGATIVE

## 2012-02-18 MED ORDER — HYDROMORPHONE HCL PF 2 MG/ML IJ SOLN
2.0000 mg | Freq: Once | INTRAMUSCULAR | Status: DC
  Filled 2012-02-18: qty 1

## 2012-02-18 MED ORDER — AZITHROMYCIN 250 MG PO TABS: ORAL_TABLET | ORAL | Status: DC

## 2012-02-18 MED ORDER — HYDROCODONE-ACETAMINOPHEN 5-325 MG PO TABS
2.0000 | ORAL_TABLET | ORAL | Status: DC | PRN
Start: 2012-02-18 — End: 2012-11-16

## 2012-02-18 MED ORDER — HYDROMORPHONE HCL PF 1 MG/ML IJ SOLN
1.0000 mg | Freq: Once | INTRAMUSCULAR | Status: AC
  Administered 2012-02-18: 1 mg via INTRAVENOUS
  Filled 2012-02-18: qty 1

## 2012-02-18 MED ORDER — HYDROMORPHONE HCL 4 MG PO TABS: 4.0000 mg | ORAL_TABLET | ORAL | Status: DC | PRN

## 2012-02-18 MED ORDER — DEXTROSE 5 % IV SOLN
1.0000 g | Freq: Once | INTRAVENOUS | Status: AC
  Administered 2012-02-18: 1 g via INTRAVENOUS
  Filled 2012-02-18: qty 10

## 2012-02-18 MED ORDER — ONDANSETRON HCL 4 MG/2ML IJ SOLN
4.0000 mg | Freq: Once | INTRAMUSCULAR | Status: AC
  Administered 2012-02-18: 4 mg via INTRAVENOUS
  Filled 2012-02-18: qty 2

## 2012-02-18 MED ORDER — MORPHINE SULFATE 4 MG/ML IJ SOLN
4.0000 mg | Freq: Once | INTRAMUSCULAR | Status: AC
  Administered 2012-02-18: 4 mg via INTRAVENOUS
  Filled 2012-02-18: qty 1

## 2012-02-18 MED ORDER — SODIUM CHLORIDE 0.9 % IV SOLN
Freq: Once | INTRAVENOUS | Status: AC
  Administered 2012-02-18: 15:00:00 via INTRAVENOUS

## 2012-02-18 MED ORDER — GADOBENATE DIMEGLUMINE 529 MG/ML IV SOLN
15.0000 mL | Freq: Once | INTRAVENOUS | Status: AC | PRN
  Administered 2012-02-18: 15 mL via INTRAVENOUS

## 2012-02-18 NOTE — ED Provider Notes (Signed)
History     CSN: [id number]  Arrival date & time 02/18/12  4098   First MD Initiated Contact with Patient 02/18/12 [phone]      Chief Complaint  Patient presents with  . Numbness  . Chest Pain    (Consider location/radiation/quality/duration/timing/severity/associated sxs/prior treatment) HPI Patient is a poor historian. History is limited by the mental capacity of the patient, patient's ability to communicate effectively, and overall poor insight.  [name] presents the emergency department with chief complaint of left-sided headache, facial numbness, paresthesias of the left hand, left sided chest pain, weakness of the left side.  Patient states that she has had chronic left-sided headache and left-sided chest pain for the past few months.  She was previously evaluated here and by her primary care physician Dr. Roseanne Reno.  Patient states that she has sharp shooting, left-sided chest pain along the lateral chest wall.  She has occasional shooting pain down the left arm and numbness of the fingertips.. she has also developed a left-sided headache and feels pressure behind the left eye.  She has intermittent scotomata in both eyes. Patient denies smoking cigarettes, however past medical history history indicates she is a current smoker.  Patient states that she drinks some beer every day.  When questioned further some beer weights 2-40 ounces of malt liquor daily.   Past Medical History  Diagnosis Date  . Depression     History reviewed. No pertinent past surgical history.  No family history on file.  History  Substance Use Topics  . Smoking status: Current Every Day Smoker  . Smokeless tobacco: Not on file  . Alcohol Use: Yes    OB History    Grav Para Term Preterm Abortions TAB SAB Ect Mult Living                  Review of Systems Ten systems reviewed and are negative for acute change, except as noted in the HPI.   Allergies  Review of patient's allergies indicates no  known allergies.  Home Medications   Current Outpatient Rx  Name  Route  Sig  Dispense  Refill  . ASPIRIN EFFERVESCENT 325 MG PO TBEF   Oral   Take 325 mg by mouth every 6 (six) hours as needed. For pain         . DIPHENOXYLATE-ATROPINE 2.5-0.025 MG PO TABS   Oral   Take 1 tablet by mouth at bedtime as needed. For stomach issues         . HYDROCODONE-ACETAMINOPHEN 10-325 MG PO TABS   Oral   Take 1 tablet by mouth every 8 (eight) hours as needed. For pain         . PRESCRIPTION MEDICATION   Oral   Take 1 tablet by mouth 2 (two) times daily. Antibiotic         . PSEUDOEPH-DOXYLAMINE-DM-APAP 60-7.07-13-998 MG/30ML PO LIQD   Oral   Take 30 mLs by mouth every 6 (six) hours as needed. For cold/cough symptoms           BP 124/78  Pulse 79  Temp 98.1 F (36.7 C) (Oral)  Resp 16  SpO2 100%  LMP 02/12/2012  Physical Exam  Constitutional: She is oriented to person, place, and time. She appears well-developed and well-nourished. No distress.  HENT:  Head: Normocephalic and atraumatic.  Eyes: Conjunctivae normal are normal. No scleral icterus.  Neck: Normal range of motion.  Cardiovascular: Normal rate, regular rhythm and normal heart sounds.  Exam reveals  no gallop and no friction rub.   No murmur heard. Pulmonary/Chest: Effort normal and breath sounds normal. No respiratory distress.  Abdominal: Soft. Bowel sounds are normal. She exhibits no distension and no mass. There is no tenderness. There is no guarding.  Neurological: She is alert and oriented to person, place, and time.       Patient with normal speech.  Facial muscles are tremulous during cranial nerve exam.  She also has bilateral tremors of the arms.  Cranial nerves II through X are grossly intact.  Patient has some deficit with left-sided finger to nose, there is weakness of the left upper and lower extremity when compared to the right side.  DTRs normal.  Normal heel to shin and rapid alternating movements.   Negative Romberg.  Normal gait. Sensation intact.  Skin: Skin is warm and dry. She is not diaphoretic.    ED Course  Procedures (including critical care time)  Labs Reviewed  CBC WITH DIFFERENTIAL - Abnormal; Notable for the following:    RBC 3.38 (*)     Hemoglobin 8.9 (*)     HCT 28.4 (*)     Platelets 428 (*)     Monocytes Relative 15 (*)     All other components within normal limits  POCT I-STAT, CHEM 8 - Abnormal; Notable for the following:    BUN 4 (*)     Hemoglobin 10.5 (*)     HCT 31.0 (*)     All other components within normal limits  PROTIME-INR  APTT  POCT I-STAT TROPONIN I  COMPREHENSIVE METABOLIC PANEL  TROPONIN I  URINALYSIS, ROUTINE W REFLEX MICROSCOPIC  ETHANOL  URINE RAPID DRUG SCREEN (HOSP PERFORMED)   Ct Head Wo Contrast  02/18/2012  *RADIOLOGY REPORT*  Clinical Data: Chronic left facial and left upper extremity numbness.  Dizziness.  CT HEAD WITHOUT CONTRAST  Technique:  Contiguous axial images were obtained from the base of the skull through the vertex without contrast.  Comparison: CT head 03/12/2003, 01/27/2010.  Findings: Ventricular system normal in size and appearance for age. No mass lesion.  No midline shift.  No acute hemorrhage or hematoma.  No extra-axial fluid collections.  No evidence of acute infarction.  No significant interval change.  Opacification of multiple bilateral ethmoid air cells. Opacification of the inferior left frontal sinus.  Mucosal thickening involving the sphenoid sinuses with an air-fluid level in the right sphenoid sinus.  Bilateral mastoid air cells and bilateral middle ear cavities well-aerated.  IMPRESSION:  1.  Normal and stable intracranially. 2.  Acute superimposed upon chronic right sphenoid sinusitis. Chronic bilateral ethmoid, left sphenoid, and left frontal sinusitis.   Original Report Authenticated By: Hulan Saas, M.D.      No diagnosis found.    MDM  11:35 AM BP 124/78  Pulse 79  Temp 98.1 F (36.7 C)  (Oral)  Resp 16  SpO2 100%  LMP 02/12/2012 CT negative for intracranial abnormality. Does show acute on chronic sinusitis  Facial pain may be due to a sinus infection.  However in going to proceed with MRI to rule out intracranial pathology as patient does have deficits on her neurologic exam.    1:42 PM Patient requests more pain medicine. She aslo appears to have a UTI. Will treat with IV rocephin  3:47 PM Patient did not inform providers of frontal metal plate.  There was significant artifact on the MRI, however no acute abnormality was seen ont he MRI or CT. Patient has acute sinusitis.  Patient complaining of symptoms of sinusitis.    Severe symptoms have been present for greater than 10 days with purulent nasal discharge and maxillary sinus pain.  Concern for acute bacterial rhinosinusitis.  Patient discharged with  zpack.  Instructions given for warm saline nasal wash and recommendations for follow-up with primary care physician.  Pain control given and f/u with pcp.  Arthor Captain, PA-C 02/19/12 1744

## 2012-02-18 NOTE — ED Notes (Signed)
Pt reporting CP. EKG obtained.

## 2012-02-18 NOTE — ED Provider Notes (Signed)
10:20 AM  Date: 02/18/2012  Rate:69  Rhythm: normal sinus rhythm  QRS Axis: normal  Intervals: normal  ST/T Wave abnormalities: normal  Conduction Disutrbances:none  Narrative Interpretation: Normal EKG  Old EKG Reviewed: unchanged    Carleene Cooper III, MD 02/18/12 1021

## 2012-02-18 NOTE — ED Notes (Signed)
Patient transported to CT 

## 2012-02-18 NOTE — ED Notes (Addendum)
Pt reporting aching/numbness on left side of face, arm, abdomen. No acute neuro deficits. This feeling has been occurring for several months. Pt is ambulatory. Alert and oriented. Also, c/o dizziness.

## 2012-02-19 NOTE — ED Provider Notes (Signed)
Medical screening examination/treatment/procedure(s) were performed by non-physician practitioner and as supervising physician I was immediately available for consultation/collaboration.   Carleene Cooper III, MD 02/19/12 2133

## 2012-03-06 ENCOUNTER — Emergency Department (HOSPITAL_COMMUNITY)
Admission: EM | Admit: 2012-03-06 | Discharge: 2012-03-06 | Disposition: A | Discharge status: Home / Self Care | Payer: Medicaid Other | Attending: Emergency Medicine | Admitting: Emergency Medicine

## 2012-03-06 ENCOUNTER — Encounter (HOSPITAL_COMMUNITY): Payer: Self-pay | Admitting: Adult Health

## 2012-03-06 ENCOUNTER — Emergency Department (HOSPITAL_COMMUNITY): Payer: Medicaid Other

## 2012-03-06 DIAGNOSIS — R5381 Other malaise: Secondary | ICD-10-CM | POA: Insufficient documentation

## 2012-03-06 DIAGNOSIS — Z8659 Personal history of other mental and behavioral disorders: Secondary | ICD-10-CM | POA: Insufficient documentation

## 2012-03-06 DIAGNOSIS — Z79899 Other long term (current) drug therapy: Secondary | ICD-10-CM | POA: Insufficient documentation

## 2012-03-06 DIAGNOSIS — F17200 Nicotine dependence, unspecified, uncomplicated: Secondary | ICD-10-CM | POA: Insufficient documentation

## 2012-03-06 DIAGNOSIS — R209 Unspecified disturbances of skin sensation: Secondary | ICD-10-CM | POA: Insufficient documentation

## 2012-03-06 DIAGNOSIS — R0789 Other chest pain: Secondary | ICD-10-CM | POA: Insufficient documentation

## 2012-03-06 DIAGNOSIS — Z7982 Long term (current) use of aspirin: Secondary | ICD-10-CM | POA: Insufficient documentation

## 2012-03-06 DIAGNOSIS — R200 Anesthesia of skin: Secondary | ICD-10-CM

## 2012-03-06 LAB — BASIC METABOLIC PANEL
BUN: 8 mg/dL (ref 6–23)
Calcium: 9 mg/dL (ref 8.4–10.5)
Creatinine, Ser: 0.61 mg/dL (ref 0.50–1.10)
GFR calc Af Amer: >90 mL/min (ref >90)
GFR calc non Af Amer: >90 mL/min (ref >90)
Glucose, Bld: 111 mg/dL — ABNORMAL HIGH (ref 70–99)
Potassium: 3.5 mEq/L (ref 3.5–5.1)

## 2012-03-06 LAB — CBC
HCT: 31.6 % — ABNORMAL LOW (ref 36.0–46.0)
MCH: 27.2 pg (ref 26.0–34.0)
MCHC: 33.9 g/dL (ref 30.0–36.0)
RDW: 13.4 % (ref 11.5–15.5)

## 2012-03-06 MED ORDER — ASPIRIN EC 325 MG PO TBEC
325.0000 mg | DELAYED_RELEASE_TABLET | Freq: Once | ORAL | Status: AC
  Administered 2012-03-06: 325 mg via ORAL
  Filled 2012-03-06: qty 1

## 2012-03-06 NOTE — ED Notes (Signed)
Pt asking for an aspirin.  Given  She is unhappy mumbling under her breath about not getting anything else for pain.  She snatched the aspirin from my hand then swallowed it

## 2012-03-06 NOTE — ED Provider Notes (Signed)
History     CSN: [id number]  Arrival date & time 03/06/12  4098   First MD Initiated Contact with Patient 03/06/12 2048      Chief Complaint  Patient presents with  . Chest Pain    (Consider location/radiation/quality/duration/timing/severity/associated sxs/prior treatment) HPI Comments: 52 y/o F p/w chest pain, diffuse left sided pain/numbness/tingling. Onset 2 days ago. Has been here for same previously. This episode no different than prior.   Patient is a 52 y.o. female presenting with general illness. The history is provided by the patient.  Illness  The current episode started 2 days ago. The onset was gradual. The problem occurs continuously. The problem has been unchanged. The problem is moderate. Nothing relieves the symptoms. Nothing aggravates the symptoms. Pertinent negatives include no fever, no abdominal pain, no diarrhea, no nausea, no vomiting, no congestion, no headaches, no rhinorrhea, no sore throat, no cough, no rash and no eye pain. She has been behaving normally. She has been eating and drinking normally. Urine output has been normal. There were no sick contacts. Recently, medical care has been given at this facility and by the PCP.    Past Medical History  Diagnosis Date  . Depression   . S/P colonoscopic polypectomy 2013    History reviewed. No pertinent past surgical history.  History reviewed. No pertinent family history.  History  Substance Use Topics  . Smoking status: Current Every Day Smoker  . Smokeless tobacco: Not on file  . Alcohol Use: Yes    OB History    Grav Para Term Preterm Abortions TAB SAB Ect Mult Living                  Review of Systems  Constitutional: Negative for fever and chills.  HENT: Negative for congestion, sore throat and rhinorrhea.   Eyes: Negative for pain and visual disturbance.  Respiratory: Negative for cough and shortness of breath.   Cardiovascular: Negative for chest pain and leg swelling.    Gastrointestinal: Negative for nausea, vomiting, abdominal pain and diarrhea.  Genitourinary: Negative for dysuria, hematuria, flank pain and difficulty urinating.  Musculoskeletal: Negative for back pain.  Skin: Negative for color change and rash.  Neurological: Positive for weakness (diffuse left sided) and numbness (diffuse left sided). Negative for dizziness, tremors, syncope, speech difficulty and headaches.  All other systems reviewed and are negative.    Allergies  Review of patient's allergies indicates no known allergies.  Home Medications   Current Outpatient Rx  Name  Route  Sig  Dispense  Refill  . ACETAMINOPHEN 500 MG PO TABS   Oral   Take 500 mg by mouth every 6 (six) hours as needed. For pain         . ASPIRIN EFFERVESCENT 325 MG PO TBEF   Oral   Take 325 mg by mouth every 6 (six) hours as needed. For pain         . AZITHROMYCIN 250 MG PO TABS      2 po day one, then 1 daily x 4 days   5 tablet   0   . DIPHENOXYLATE-ATROPINE 2.5-0.025 MG PO TABS   Oral   Take 1 tablet by mouth at bedtime as needed. For stomach issues         . HYDROCODONE-ACETAMINOPHEN 5-325 MG PO TABS   Oral   Take 2 tablets by mouth every 4 (four) hours as needed for pain.   10 tablet   0   . PSEUDOEPH-DOXYLAMINE-DM-APAP 60-7.07-13-998  MG/30ML PO LIQD   Oral   Take 30 mLs by mouth every 6 (six) hours as needed. For cold/cough symptoms         . PSYLLIUM 58.6 % PO PACK   Oral   Take 1 packet by mouth daily.           BP 119/77  Pulse 90  Temp 98.1 F (36.7 C) (Oral)  Resp 16  Ht 5' (1.524 m)  Wt 110 lb (49.896 kg)  BMI 21.48 kg/m2  SpO2 100%  LMP 02/12/2012  Physical Exam  Nursing note and vitals reviewed. Constitutional: She is oriented to person, place, and time. She appears well-developed and well-nourished. No distress.  HENT:  Head: Normocephalic and atraumatic.  Eyes: Conjunctivae normal are normal. Right eye exhibits no discharge. Left eye exhibits  no discharge.  Neck: No tracheal deviation present.  Cardiovascular: Normal rate, regular rhythm, normal heart sounds and intact distal pulses.   Pulmonary/Chest: Effort normal and breath sounds normal. No stridor. No respiratory distress. She has no wheezes. She has no rales.  Abdominal: Soft. She exhibits no distension. There is no tenderness. There is no guarding.  Musculoskeletal: She exhibits no edema and no tenderness.  Neurological: She is alert and oriented to person, place, and time. She has normal strength and normal reflexes. She displays no tremor. No cranial nerve deficit. Sensory deficit: reports decreased sensation entire left face, LUE, left torso, LLE. She exhibits normal muscle tone. She displays a negative Romberg sign. Coordination and gait normal. GCS eye subscore is 4. GCS verbal subscore is 5. GCS motor subscore is 6.  Skin: Skin is warm and dry.  Psychiatric: She has a normal mood and affect. Her behavior is normal.    ED Course  Procedures (including critical care time)  Labs Reviewed  CBC - Abnormal; Notable for the following:    Hemoglobin 10.7 (*)     HCT 31.6 (*)     All other components within normal limits  BASIC METABOLIC PANEL - Abnormal; Notable for the following:    Glucose, Bld 111 (*)     All other components within normal limits  POCT I-STAT TROPONIN I   Dg Chest 2 View  03/06/2012  *RADIOLOGY REPORT*  Clinical Data: Left chest and arm pain.  CHEST - 2 VIEW  Comparison: 12/23/2011  Findings: Two views of the chest were obtained.  The lungs are clear without airspace disease or edema.  Stable appearance of the heart and mediastinum.  The trachea is midline.  The bony thorax is intact.  IMPRESSION: No acute chest findings.   Original Report Authenticated By: Richarda Overlie, M.D.      1. Atypical chest pain   2. Numbness      Date: 03/06/2012  Rate: 85  Rhythm: normal sinus rhythm  QRS Axis: normal  Intervals: normal  ST/T Wave abnormalities:  normal  Conduction Disutrbances:none  Narrative Interpretation:   Old EKG Reviewed: unchanged    MDM    52 y/o F p/w left chest pain, left body weakness and numbness. Onset 2 days ago. Has had the same numerous times in the past. Recent negative CT head and MRI. These symptoms no different than prior. HDS, af. NAD. Neuro exam largely unremarkable as above. Do not believe emergent imaging necessary at this time. History and physical not c/w stroke or brain mass. No recent injury/trauma. No back pain.  Labs and imaging as above. Unremarkable. Patient discharged home. Return precautions given. To follow up with  pcp. patient in agreement with plan.  Unlikely PNA as CXR negative, no leukocytosis, no cough, no fever Unlikely ACS as troponin wnl, EKG wnl, low risk Unlikely PE as atypical presentation, low risk per [facility], Doubt Aortic Dissection, Pancreatitis, Arrhythmia, Pneumothorax, Endo/Myo/Pericarditis, Shingles, Emergent complications of an Ulcer, Esophageal pathology, or other emergent pathology.    Labs and imaging reviewed by myself and considered in medical decision making if ordered. Imaging interpreted by radiology.   Discussed case with Dr. Bernette Mayers who is in agreement with assessment and plan.          Stevie Kern, MD 03/06/12 2330

## 2012-03-06 NOTE — ED Notes (Signed)
Presents with chronic left sided headache, left sided weakness, left sided chest pain, left sided numbness that goes from the left side of her chest to her left arm up to the back of her neck and down to her toes. Pt is wanting an MRI of her entire left side of her body. She was seen here last for this pain on the 4th of this month.

## 2012-03-06 NOTE — ED Notes (Signed)
MD at bedside. 

## 2012-03-06 NOTE — ED Notes (Signed)
The patient is AOx4 and comfortable with her discharge instructions. 

## 2012-03-06 NOTE — ED Notes (Signed)
The pt has pain in her entire lt side of her body for months and she has been here  Several times for the same.  She has had tests but she does not have a definite answer for the pain.  Alert no distress skin warm and dry

## 2012-03-06 NOTE — ED Provider Notes (Signed)
I saw and evaluated the patient, reviewed the resident's note and I agree with the findings and plan.  Agree with EKG interpretation if present.   Pt with multiple chronic complaints. Previous workup for same including MRI. Advised PCP followup.  Charles B. Bernette Mayers, MD 03/06/12 [phone]

## 2012-03-06 NOTE — ED Notes (Signed)
Pt remains alert no distress 

## 2012-05-02 ENCOUNTER — Other Ambulatory Visit (HOSPITAL_COMMUNITY): Payer: Self-pay | Admitting: Internal Medicine

## 2012-05-02 ENCOUNTER — Ambulatory Visit (HOSPITAL_COMMUNITY)
Admission: RE | Admit: 2012-05-02 | Discharge: 2012-05-02 | Disposition: A | Discharge status: Home / Self Care | Payer: Medicaid Other | Source: Ambulatory Visit | Attending: Internal Medicine | Admitting: Internal Medicine

## 2012-05-02 DIAGNOSIS — R52 Pain, unspecified: Secondary | ICD-10-CM

## 2012-05-02 DIAGNOSIS — M25419 Effusion, unspecified shoulder: Secondary | ICD-10-CM | POA: Insufficient documentation

## 2012-05-02 DIAGNOSIS — M25519 Pain in unspecified shoulder: Secondary | ICD-10-CM | POA: Insufficient documentation

## 2012-06-26 ENCOUNTER — Other Ambulatory Visit (HOSPITAL_COMMUNITY): Payer: Self-pay | Admitting: Internal Medicine

## 2012-06-26 ENCOUNTER — Ambulatory Visit (HOSPITAL_COMMUNITY)
Admission: RE | Admit: 2012-06-26 | Discharge: 2012-06-26 | Disposition: A | Discharge status: Home / Self Care | Payer: Medicaid Other | Source: Ambulatory Visit | Attending: Internal Medicine | Admitting: Internal Medicine

## 2012-06-26 DIAGNOSIS — R52 Pain, unspecified: Secondary | ICD-10-CM

## 2012-06-26 DIAGNOSIS — M25559 Pain in unspecified hip: Secondary | ICD-10-CM | POA: Insufficient documentation

## 2012-07-22 IMAGING — CT CT HEAD W/O CM
1 of 2 series · 13 of 30 positions shown, 17 images · non-contrast
Comparison: 03/12/2003

CLINICAL DATA: Right sided headache.  History of colon cancer.

CT HEAD WITHOUT CONTRAST
TECHNIQUE: Contiguous axial images were obtained from the base of
the skull through the vertex without contrast.

[Series 2: brain · axial · 0.47mm/px · z∈[+135,+266]mm · 13 of 32 slices shown, 17 images]
[im 3/32  brain]
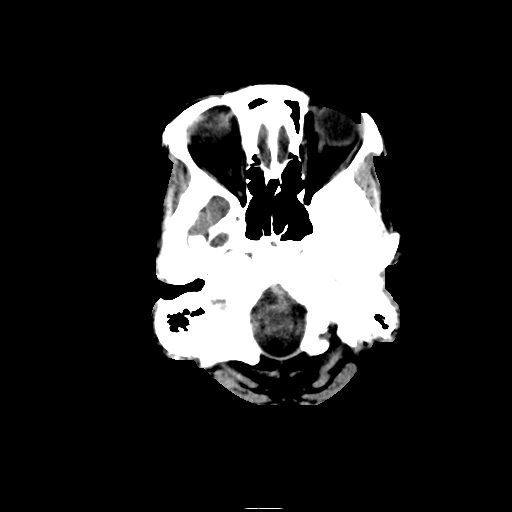
[im 3/32  bone]
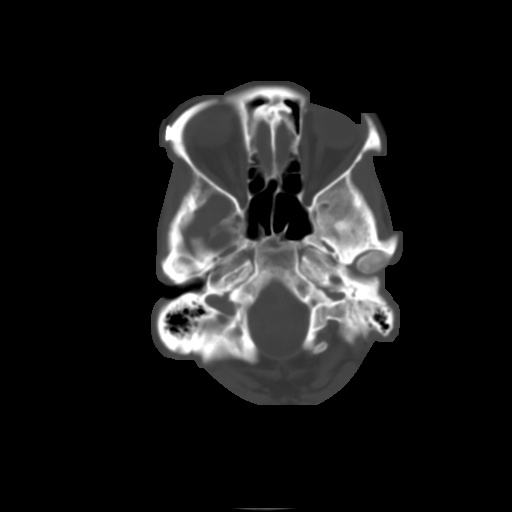
[im 5/32  brain]
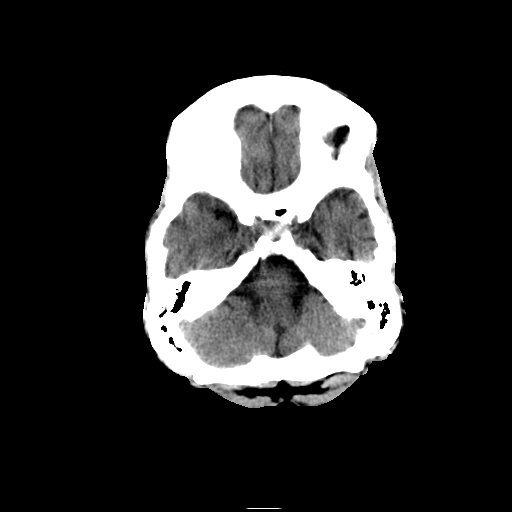
[im 7/32  brain]
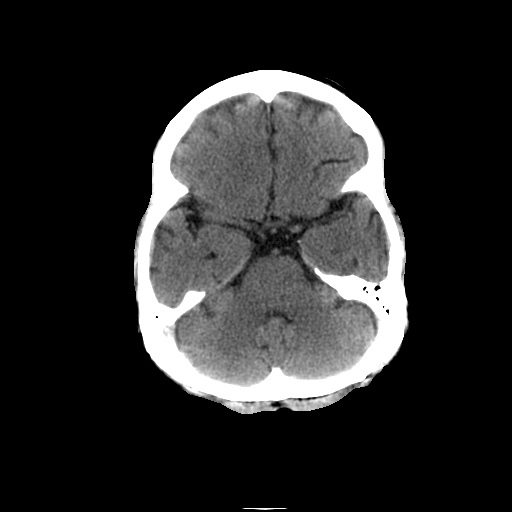
[im 9/32  brain]
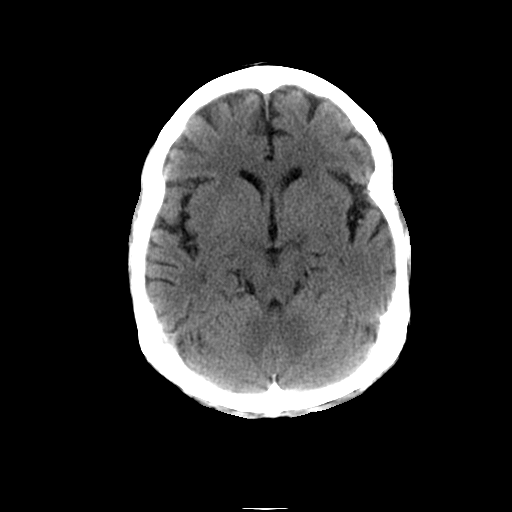
[im 12/32  brain]
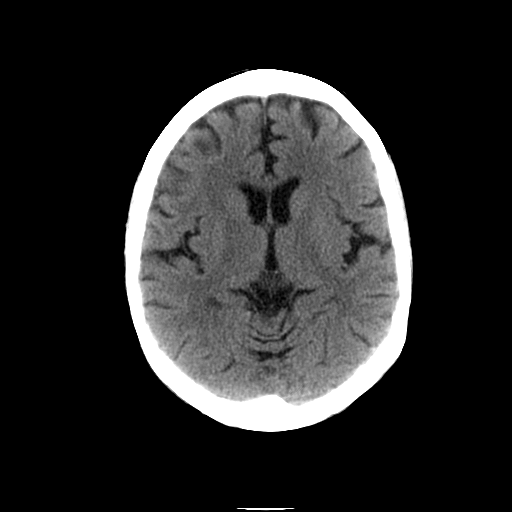
[im 12/32  bone]
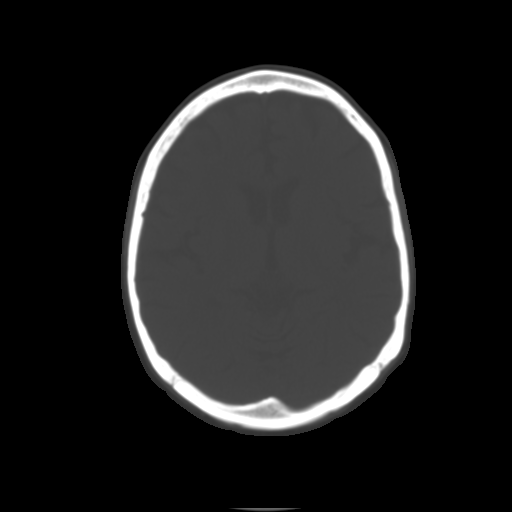
[im 14/32  brain]
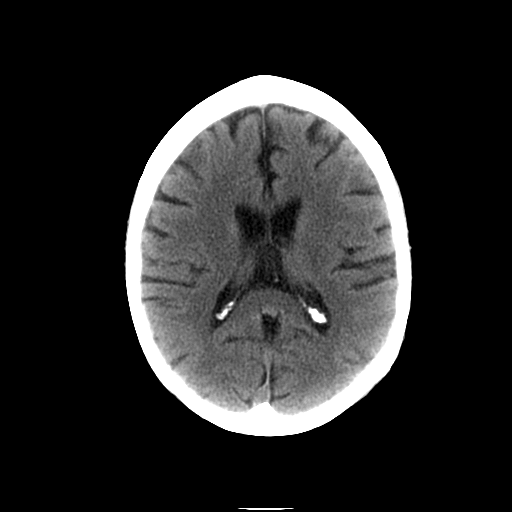
[im 16/32  brain]
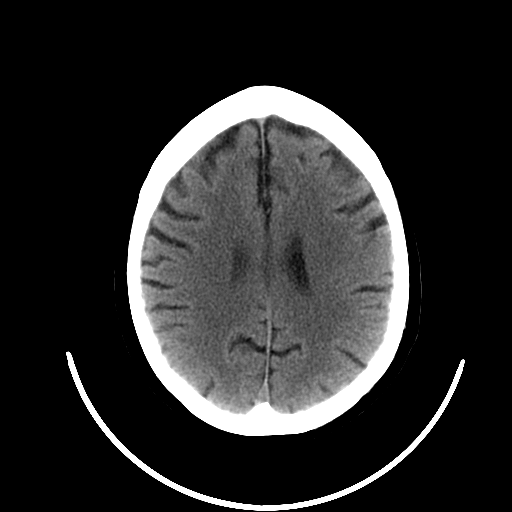
[im 18/32  brain]
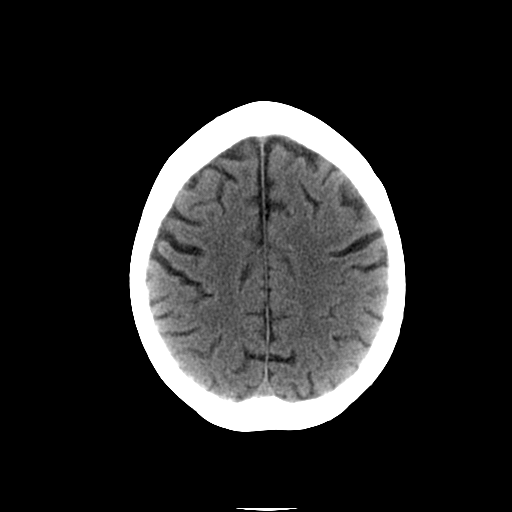
[im 20/32  brain]
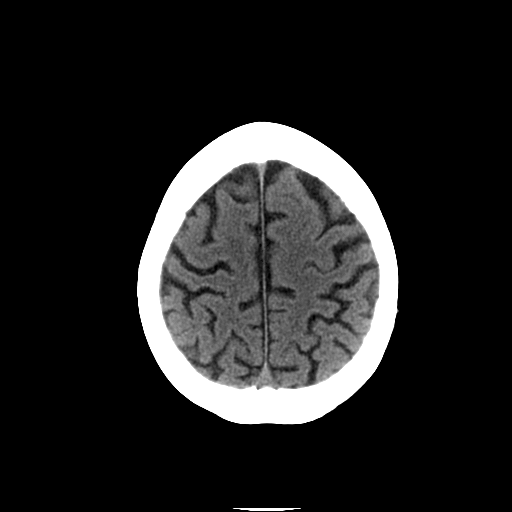
[im 20/32  bone]
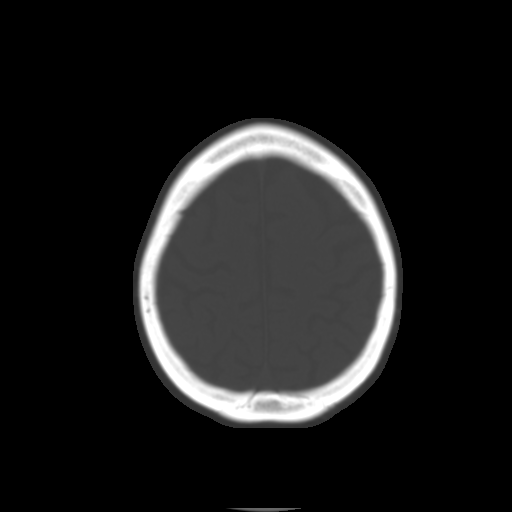
[im 23/32  brain]
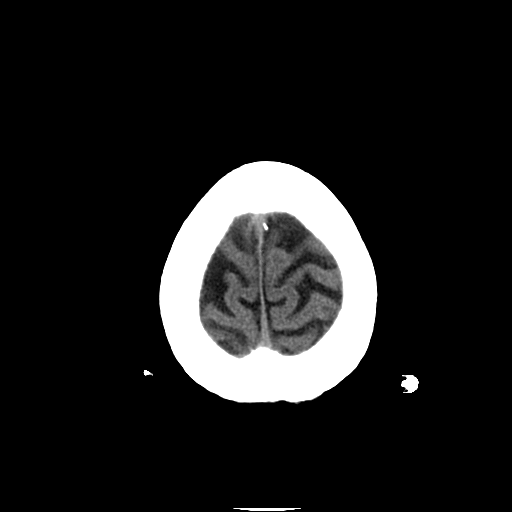
[im 25/32  brain]
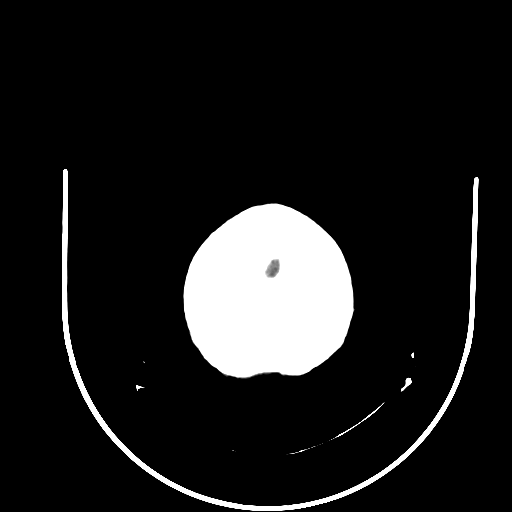
[im 27/32  brain]
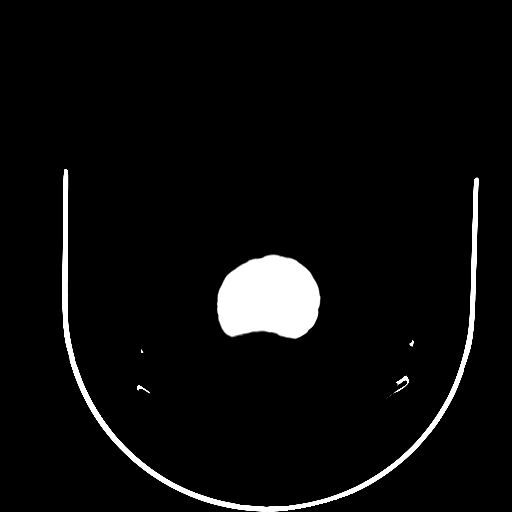
[im 29/32  brain]
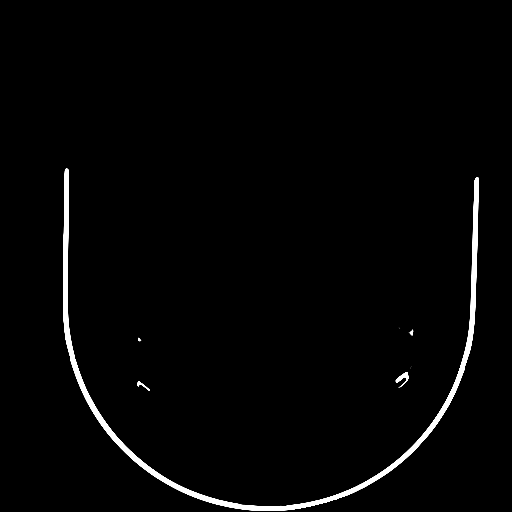
[im 29/32  bone]
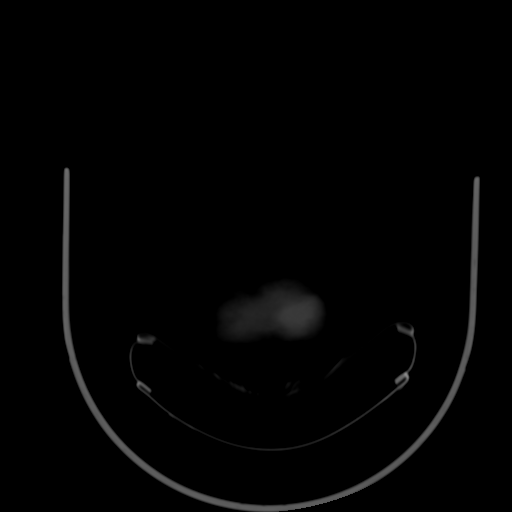

[13 of 30 positions shown; findings below may reference images not displayed]

FINDINGS: No acute intracranial abnormality.  Specifically, no
hemorrhage, hydrocephalus, mass lesion, acute infarction, or
significant intracranial injury.  No acute calvarial abnormality.
Visualized paranasal sinuses and mastoids clear.  Orbital soft
tissues unremarkable.  Old left medial orbital wall blowout
fracture noted.  Evidence of prior internal fixation along the
right lateral orbital rim.
IMPRESSION: No intracranial abnormality.

## 2012-11-16 ENCOUNTER — Encounter (HOSPITAL_COMMUNITY): Payer: Self-pay | Admitting: Emergency Medicine

## 2012-11-16 ENCOUNTER — Emergency Department (HOSPITAL_COMMUNITY)
Admission: EM | Admit: 2012-11-16 | Discharge: 2012-11-16 | Disposition: A | Discharge status: Home / Self Care | Payer: Medicaid Other | Attending: Emergency Medicine | Admitting: Emergency Medicine

## 2012-11-16 ENCOUNTER — Emergency Department (HOSPITAL_COMMUNITY): Payer: Medicaid Other

## 2012-11-16 DIAGNOSIS — Y929 Unspecified place or not applicable: Secondary | ICD-10-CM | POA: Insufficient documentation

## 2012-11-16 DIAGNOSIS — IMO0002 Reserved for concepts with insufficient information to code with codable children: Secondary | ICD-10-CM | POA: Insufficient documentation

## 2012-11-16 DIAGNOSIS — Z791 Long term (current) use of non-steroidal anti-inflammatories (NSAID): Secondary | ICD-10-CM | POA: Insufficient documentation

## 2012-11-16 DIAGNOSIS — F17200 Nicotine dependence, unspecified, uncomplicated: Secondary | ICD-10-CM | POA: Insufficient documentation

## 2012-11-16 DIAGNOSIS — S9030XA Contusion of unspecified foot, initial encounter: Secondary | ICD-10-CM | POA: Insufficient documentation

## 2012-11-16 DIAGNOSIS — S9031XA Contusion of right foot, initial encounter: Secondary | ICD-10-CM

## 2012-11-16 DIAGNOSIS — Z79899 Other long term (current) drug therapy: Secondary | ICD-10-CM | POA: Insufficient documentation

## 2012-11-16 DIAGNOSIS — Y939 Activity, unspecified: Secondary | ICD-10-CM | POA: Insufficient documentation

## 2012-11-16 DIAGNOSIS — Z8659 Personal history of other mental and behavioral disorders: Secondary | ICD-10-CM | POA: Insufficient documentation

## 2012-11-16 MED ORDER — HYDROCODONE-ACETAMINOPHEN 5-325 MG PO TABS
1.0000 | ORAL_TABLET | Freq: Once | ORAL | Status: AC
  Administered 2012-11-16: 1 via ORAL
  Filled 2012-11-16: qty 1

## 2012-11-16 MED ORDER — IBUPROFEN 800 MG PO TABS: 800.0000 mg | ORAL_TABLET | Freq: Three times a day (TID) | ORAL | Status: DC

## 2012-11-16 MED ORDER — HYDROCODONE-ACETAMINOPHEN 5-325 MG PO TABS: 1.0000 | ORAL_TABLET | Freq: Four times a day (QID) | ORAL | Status: DC | PRN
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
## 2012-11-19 NOTE — ED Provider Notes (Signed)
Medical screening examination/treatment/procedure(s) were performed by non-physician practitioner and as supervising physician I was immediately available for consultation/collaboration.    Raymund Manrique D Monserat Prestigiacomo, MD 11/19/12 1457 

## 2012-11-27 ENCOUNTER — Emergency Department (HOSPITAL_COMMUNITY): Payer: Medicaid Other

## 2012-11-27 ENCOUNTER — Emergency Department (HOSPITAL_COMMUNITY)
Admission: EM | Admit: 2012-11-27 | Discharge: 2012-11-27 | Disposition: A | Discharge status: Home / Self Care | Payer: Medicaid Other | Attending: Emergency Medicine | Admitting: Emergency Medicine

## 2012-11-27 ENCOUNTER — Encounter (HOSPITAL_COMMUNITY): Payer: Self-pay | Admitting: Emergency Medicine

## 2012-11-27 DIAGNOSIS — Y939 Activity, unspecified: Secondary | ICD-10-CM | POA: Insufficient documentation

## 2012-11-27 DIAGNOSIS — F17200 Nicotine dependence, unspecified, uncomplicated: Secondary | ICD-10-CM | POA: Insufficient documentation

## 2012-11-27 DIAGNOSIS — Y9289 Other specified places as the place of occurrence of the external cause: Secondary | ICD-10-CM | POA: Insufficient documentation

## 2012-11-27 DIAGNOSIS — W108XXA Fall (on) (from) other stairs and steps, initial encounter: Secondary | ICD-10-CM | POA: Insufficient documentation

## 2012-11-27 DIAGNOSIS — IMO0002 Reserved for concepts with insufficient information to code with codable children: Secondary | ICD-10-CM | POA: Insufficient documentation

## 2012-11-27 DIAGNOSIS — S82899A Other fracture of unspecified lower leg, initial encounter for closed fracture: Secondary | ICD-10-CM | POA: Insufficient documentation

## 2012-11-27 DIAGNOSIS — Z79899 Other long term (current) drug therapy: Secondary | ICD-10-CM | POA: Insufficient documentation

## 2012-11-27 DIAGNOSIS — S82891A Other fracture of right lower leg, initial encounter for closed fracture: Secondary | ICD-10-CM

## 2012-11-27 DIAGNOSIS — Z791 Long term (current) use of non-steroidal anti-inflammatories (NSAID): Secondary | ICD-10-CM | POA: Insufficient documentation

## 2012-11-27 DIAGNOSIS — Z8659 Personal history of other mental and behavioral disorders: Secondary | ICD-10-CM | POA: Insufficient documentation

## 2012-11-27 DIAGNOSIS — Z9889 Other specified postprocedural states: Secondary | ICD-10-CM | POA: Insufficient documentation

## 2012-11-27 MED ORDER — OXYCODONE-ACETAMINOPHEN 5-325 MG PO TABS: 1.0000 | ORAL_TABLET | Freq: Four times a day (QID) | ORAL | Status: DC | PRN

## 2012-11-27 MED ORDER — HYDROMORPHONE HCL PF 1 MG/ML IJ SOLN
1.0000 mg | Freq: Once | INTRAMUSCULAR | Status: AC
  Administered 2012-11-27: 1 mg via INTRAMUSCULAR
  Filled 2012-11-27: qty 1

## 2012-11-27 NOTE — ED Notes (Signed)
Pt states that she fell down stairs today. Pt states that her back hurts. Pt states that she is also having right ankle pain. Pt has swollen right ankle.

## 2012-11-27 NOTE — ED Notes (Signed)
Pt discharged.Vital signs stable and GCS 15.Discharge instruction given. 

## 2012-11-27 NOTE — Progress Notes (Signed)
Orthopedic Tech Progress Note Patient Details:  Betty Avery [birth year] [id number]  Ortho Devices Type of Ortho Device: Ace wrap;Post (short leg) splint Ortho Device/Splint Location: RLE Ortho Device/Splint Interventions: Ordered;Application   Jennye Moccasin 11/27/2012, 9:34 PM

## 2012-11-27 NOTE — ED Provider Notes (Signed)
CSN: [id number]     Arrival date & time 11/27/12  1931 History  This chart was scribed for Betty Morn, NP working with Enid Skeens, MD by Carl Best, ED Scribe. This patient was seen in room TR05C/TR05C and the patient's care was started at 8:46 PM.      Chief Complaint  Patient presents with  . Back Pain  . Ankle Pain    Patient is a 52 y.o. female presenting with back pain. The history is provided by the patient. No language interpreter was used.  Back Pain  HPI Comments: Betty Avery is a 52 y.o. female who presents to the Emergency Department complaining of constant hip, back, and ankle pain that started today after she fell down 12 stairs.  She denies neck pain as an associated symptom.  The patient denies taking any medication to alleviate her symptoms.  The patient denies having any pertinent medical problems.  She states that she is an occasional drinker and a tobacco user.     Past Medical History  Diagnosis Date  . Depression   . S/P colonoscopic polypectomy 2013   History reviewed. No pertinent past surgical history. History reviewed. No pertinent family history. History  Substance Use Topics  . Smoking status: Current Some Day Smoker    Types: Cigarettes  . Smokeless tobacco: Not on file  . Alcohol Use: Yes   OB History   Grav Para Term Preterm Abortions TAB SAB Ect Mult Living                 Review of Systems  Musculoskeletal: Positive for back pain.  All other systems reviewed and are negative.    Allergies  Review of patient's allergies indicates no known allergies.  Home Medications   Current Outpatient Rx  Name  Route  Sig  Dispense  Refill  . acetaminophen (TYLENOL) 500 MG tablet   Oral   Take 1,000 mg by mouth every 6 (six) hours as needed for pain.         Marland Kitchen diclofenac (VOLTAREN) 25 MG EC tablet   Oral   Take 25 mg by mouth 2 (two) times daily.         . diphenoxylate-atropine (LOMOTIL) 2.5-0.025 MG per tablet   Oral   Take  1 tablet by mouth at bedtime as needed. For stomach issues         . HYDROcodone-acetaminophen (NORCO/VICODIN) 5-325 MG per tablet   Oral   Take 1 tablet by mouth every 6 (six) hours as needed for pain.   7 tablet   0   . ibuprofen (ADVIL,MOTRIN) 800 MG tablet   Oral   Take 1 tablet (800 mg total) by mouth 3 (three) times daily.   21 tablet   0   . psyllium (METAMUCIL) 58.6 % packet   Oral   Take 1 packet by mouth daily.          Triage Vitals: BP 174/86  Pulse 92  Temp(Src) 97.5 F (36.4 C) (Oral)  Resp 16  SpO2 100%  LMP 09/21/2012  Physical Exam  Nursing note and vitals reviewed. Constitutional: She is oriented to person, place, and time. She appears well-developed and well-nourished. No distress.  HENT:  Head: Normocephalic and atraumatic.  Eyes: EOM are normal.  Neck: Neck supple. No tracheal deviation present.  Cardiovascular: Normal rate.    distal pulse and sensation are intact  Pulmonary/Chest: Effort normal. No respiratory distress.  Musculoskeletal: Normal range of motion.  Bilateral pelvic wing tenderness, swelling and pain in the right ankle  Neurological: She is alert and oriented to person, place, and time.  Skin: Skin is warm and dry.  bilateral extremities are cool to the touch.  Psychiatric: She has a normal mood and affect. Her behavior is normal.    ED Course  Procedures (including critical care time)  DIAGNOSTIC STUDIES: Oxygen Saturation is 100% on room air, normal by my interpretation.    COORDINATION OF CARE: 8:48 PM- Discussed obtaining an x-ray of the pelvis and discussed the x-ray results of the left ankle that revealed a fracture.  Discussed placing a splint on the left ankle and administering pain medication in the ED.  The patient agreed to the treatment plan.     Labs Review Labs Reviewed - No data to display Imaging Review Dg Pelvis 1-2 Views  11/27/2012   CLINICAL DATA:  Fall with hip pain  EXAM: PELVIS - 1-2 VIEW   COMPARISON:  06/26/2012  FINDINGS: There is no evidence of pelvic fracture or diastasis. No other pelvic bone lesions are seen.  IMPRESSION: Negative.   Electronically Signed   By: Tiburcio Pea M.D.   On: 11/27/2012 22:18   Dg Ankle Complete Right  11/27/2012   CLINICAL DATA:  Fall and ankle pain.  EXAM: RIGHT ANKLE - COMPLETE 3+ VIEW  COMPARISON:  Right foot 11/16/2012  FINDINGS: There is diffuse soft tissue swelling. Mildly displaced fracture of the distal fibula. Displaced fracture of the medial malleolus. The ankle remains located.  IMPRESSION: Displaced fractures of the medial and lateral malleolus. Diffuse soft tissue swelling.   Electronically Signed   By: Richarda Overlie M.D.   On: 11/27/2012 20:42    EKG Interpretation   None     Radiology results reviewed and shared with patient.  Ankle splint applied by ortho tech.  Crutches provided.  Patient to follow-up with orthopedics.  MDM   1. Closed right ankle fracture, initial encounter    I personally performed the services described in this documentation, which was scribed in my presence. The recorded information has been reviewed and is accurate.    Jimmye Norman, NP 11/27/12 [phone]

## 2012-11-28 NOTE — ED Provider Notes (Signed)
Medical screening examination/treatment/procedure(s) were performed by non-physician practitioner and as supervising physician I was immediately available for consultation/collaboration.   Aleida Crandell M Laporcha Marchesi, MD 11/28/12 0110 

## 2012-11-29 ENCOUNTER — Encounter (HOSPITAL_BASED_OUTPATIENT_CLINIC_OR_DEPARTMENT_OTHER): Payer: Self-pay | Admitting: *Deleted
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
## 2012-11-30 ENCOUNTER — Encounter (HOSPITAL_BASED_OUTPATIENT_CLINIC_OR_DEPARTMENT_OTHER): Payer: Self-pay | Admitting: Anesthesiology

## 2012-11-30 ENCOUNTER — Ambulatory Visit (HOSPITAL_BASED_OUTPATIENT_CLINIC_OR_DEPARTMENT_OTHER): Admission: RE | Admit: 2012-11-30 | Payer: Medicaid Other | Source: Ambulatory Visit | Admitting: Orthopedic Surgery

## 2012-11-30 HISTORY — DX: Other constipation: K59.09

## 2012-11-30 HISTORY — DX: Presence of spectacles and contact lenses: Z97.3

## 2012-11-30 SURGERY — OPEN REDUCTION INTERNAL FIXATION (ORIF) ANKLE FRACTURE
Anesthesia: General | Site: Ankle | Laterality: Right

## 2012-11-30 MED ORDER — BUPIVACAINE HCL (PF) 0.25 % IJ SOLN
INTRAMUSCULAR | Status: AC
  Filled 2012-11-30: qty 30

## 2013-01-17 ENCOUNTER — Ambulatory Visit: Payer: Medicaid Other | Attending: Orthopedic Surgery | Admitting: Physical Therapy

## 2013-01-17 DIAGNOSIS — R609 Edema, unspecified: Secondary | ICD-10-CM | POA: Insufficient documentation

## 2013-01-17 DIAGNOSIS — IMO0001 Reserved for inherently not codable concepts without codable children: Secondary | ICD-10-CM | POA: Insufficient documentation

## 2013-01-17 DIAGNOSIS — M25673 Stiffness of unspecified ankle, not elsewhere classified: Secondary | ICD-10-CM | POA: Insufficient documentation

## 2013-01-17 DIAGNOSIS — M25579 Pain in unspecified ankle and joints of unspecified foot: Secondary | ICD-10-CM | POA: Insufficient documentation

## 2013-01-17 DIAGNOSIS — R5381 Other malaise: Secondary | ICD-10-CM | POA: Insufficient documentation

## 2013-01-17 DIAGNOSIS — M25676 Stiffness of unspecified foot, not elsewhere classified: Secondary | ICD-10-CM | POA: Insufficient documentation

## 2013-01-17 DIAGNOSIS — R262 Difficulty in walking, not elsewhere classified: Secondary | ICD-10-CM | POA: Insufficient documentation

## 2013-01-29 ENCOUNTER — Ambulatory Visit: Payer: Medicaid Other | Admitting: Physical Therapy

## 2013-02-04 ENCOUNTER — Encounter: Payer: Medicaid Other | Admitting: Physical Therapy

## 2013-02-11 ENCOUNTER — Ambulatory Visit: Payer: Medicaid Other | Admitting: Physical Therapy

## 2013-02-12 ENCOUNTER — Emergency Department (HOSPITAL_COMMUNITY)
Admission: EM | Admit: 2013-02-12 | Discharge: 2013-02-13 | Disposition: A | Discharge status: Home / Self Care | Payer: Medicaid Other | Attending: Emergency Medicine | Admitting: Emergency Medicine

## 2013-02-12 ENCOUNTER — Encounter (HOSPITAL_COMMUNITY): Payer: Self-pay | Admitting: Emergency Medicine

## 2013-02-12 ENCOUNTER — Emergency Department (HOSPITAL_COMMUNITY): Payer: Medicaid Other

## 2013-02-12 DIAGNOSIS — IMO0001 Reserved for inherently not codable concepts without codable children: Secondary | ICD-10-CM | POA: Insufficient documentation

## 2013-02-12 DIAGNOSIS — F17200 Nicotine dependence, unspecified, uncomplicated: Secondary | ICD-10-CM | POA: Insufficient documentation

## 2013-02-12 DIAGNOSIS — R51 Headache: Secondary | ICD-10-CM | POA: Insufficient documentation

## 2013-02-12 DIAGNOSIS — Z9889 Other specified postprocedural states: Secondary | ICD-10-CM | POA: Insufficient documentation

## 2013-02-12 DIAGNOSIS — Z91040 Latex allergy status: Secondary | ICD-10-CM | POA: Insufficient documentation

## 2013-02-12 DIAGNOSIS — M25519 Pain in unspecified shoulder: Secondary | ICD-10-CM | POA: Insufficient documentation

## 2013-02-12 DIAGNOSIS — M25559 Pain in unspecified hip: Secondary | ICD-10-CM | POA: Insufficient documentation

## 2013-02-12 DIAGNOSIS — Z791 Long term (current) use of non-steroidal anti-inflammatories (NSAID): Secondary | ICD-10-CM | POA: Insufficient documentation

## 2013-02-12 DIAGNOSIS — Z8659 Personal history of other mental and behavioral disorders: Secondary | ICD-10-CM | POA: Insufficient documentation

## 2013-02-12 DIAGNOSIS — H53149 Visual discomfort, unspecified: Secondary | ICD-10-CM | POA: Insufficient documentation

## 2013-02-12 DIAGNOSIS — Z8719 Personal history of other diseases of the digestive system: Secondary | ICD-10-CM | POA: Insufficient documentation

## 2013-02-12 DIAGNOSIS — H5710 Ocular pain, unspecified eye: Secondary | ICD-10-CM | POA: Insufficient documentation

## 2013-02-12 LAB — POCT I-STAT TROPONIN I: Troponin i, poc: 0 ng/mL (ref 0.00–0.08)

## 2013-02-12 LAB — BASIC METABOLIC PANEL
BUN: 11 mg/dL (ref 6–23)
CO2: 23 mEq/L (ref 19–32)
Calcium: 9.3 mg/dL (ref 8.4–10.5)
Creatinine, Ser: 0.76 mg/dL (ref 0.50–1.10)

## 2013-02-12 LAB — CBC
HCT: 35.5 % — ABNORMAL LOW (ref 36.0–46.0)
MCH: 26.3 pg (ref 26.0–34.0)
MCV: 79.2 fL (ref 78.0–100.0)
Platelets: 428 10*3/uL — ABNORMAL HIGH (ref 150–400)
RBC: 4.48 MIL/uL (ref 3.87–5.11)
RDW: 16.6 % — ABNORMAL HIGH (ref 11.5–15.5)

## 2013-02-12 MED ORDER — OXYCODONE-ACETAMINOPHEN 5-325 MG PO TABS
1.0000 | ORAL_TABLET | Freq: Once | ORAL | Status: AC
  Administered 2013-02-12: 1 via ORAL
  Filled 2013-02-12: qty 1

## 2013-02-12 MED ORDER — DIAZEPAM 5 MG PO TABS
5.0000 mg | ORAL_TABLET | Freq: Once | ORAL | Status: AC
  Administered 2013-02-12: 5 mg via ORAL
  Filled 2013-02-12: qty 1

## 2013-02-12 MED ORDER — TETRACAINE HCL 0.5 % OP SOLN
2.0000 [drp] | Freq: Once | OPHTHALMIC | Status: AC
  Administered 2013-02-12: 2 [drp] via OPHTHALMIC
  Filled 2013-02-12: qty 2

## 2013-02-12 NOTE — ED Notes (Signed)
Pt sts woke up at 12pm today with left sided HA and pressure in left eye with pain radiating into neck and left shoulder

## 2013-02-12 NOTE — ED Provider Notes (Signed)
CSN: [id number]     Arrival date & time 02/12/13  1843 History   First MD Initiated Contact with Patient 02/12/13 2144     Chief Complaint  Patient presents with  . Headache  . Shoulder Pain   (Consider location/radiation/quality/duration/timing/severity/associated sxs/prior Treatment) HPI Betty Avery is a 52 y.o. female who presents emergency department complaining of headache and left thigh pain. States pain began approximately 12 PM today while watching TV. She states onset of pain is sudden. She states it feels sharp in a first started the left side and then radiate into the left eye in threw today radiated down left neck into the left shoulder and left ribs. Patient states he took a Vicodin for this pain at home with no relief. She then was given a Percocet in the waiting room which did not help her symptoms either. She denies any visual changes. She states she is having nasal congestion and sinus pressure. She states her left eye is watering. She also reports left ear pain. She denies any fever or chills. She denies any chest pain or shortness of breath other than pain in the left ribs. She states that she has history of left shoulder pain and she has seen her Dr. Ala Dach and was given a prescription for Mobic which she has not picked up yet.  Past Medical History  Diagnosis Date  . Depression   . S/P colonoscopic polypectomy 2013  . Chronic constipation   . Wears glasses    Past Surgical History  Procedure Laterality Date  . Orbital fracture surgery  2001    rt eye-fx-plate   . Tonsillectomy    . Colonoscopy      x2   History reviewed. No pertinent family history. History  Substance Use Topics  . Smoking status: Current Some Day Smoker    Types: Cigarettes  . Smokeless tobacco: Not on file  . Alcohol Use: Yes     Comment: beer-   OB History   Grav Para Term Preterm Abortions TAB SAB Ect Mult Living                 Review of Systems  Constitutional: Negative for  fever and chills.  Eyes: Positive for photophobia and pain. Negative for redness and visual disturbance.  Respiratory: Negative for cough, chest tightness and shortness of breath.   Cardiovascular: Negative for chest pain, palpitations and leg swelling.  Gastrointestinal: Negative for nausea, vomiting, abdominal pain and diarrhea.  Genitourinary: Negative for dysuria, flank pain, vaginal bleeding, vaginal discharge, vaginal pain and pelvic pain.  Musculoskeletal: Positive for arthralgias and myalgias. Negative for neck pain and neck stiffness.  Skin: Negative for rash.  Neurological: Positive for headaches. Negative for dizziness, weakness and light-headedness.  All other systems reviewed and are negative.    Allergies  Latex  Home Medications   Current Outpatient Rx  Name  Route  Sig  Dispense  Refill  . acetaminophen (TYLENOL) 500 MG tablet   Oral   Take 1,000 mg by mouth daily as needed (pain).          Marland Kitchen diphenoxylate-atropine (LOMOTIL) 2.5-0.025 MG per tablet   Oral   Take 1 tablet by mouth daily as needed for diarrhea or loose stools.          Marland Kitchen HYDROcodone-acetaminophen (NORCO/VICODIN) 5-325 MG per tablet   Oral   Take 1 tablet by mouth every 6 (six) hours as needed (pain).         . Meloxicam (  MOBIC PO)   Oral   Take 1 tablet by mouth daily.          BP 91/70  Pulse 84  Temp(Src) 97.4 F (36.3 C) (Oral)  Resp 18  Ht 5\' 1"  (1.549 m)  Wt 110 lb (49.896 kg)  BMI 20.80 kg/m2  SpO2 100% Physical Exam  Nursing note and vitals reviewed. Constitutional: She is oriented to person, place, and time. She appears well-developed and well-nourished. No distress.  HENT:  Head: Normocephalic.  Eyes: Conjunctivae and EOM are normal. Pupils are equal, round, and reactive to light.  Neck: Neck supple.  Cardiovascular: Normal rate, regular rhythm and normal heart sounds.   Pulmonary/Chest: Effort normal and breath sounds normal. No respiratory distress. She has no  wheezes. She has no rales.  Abdominal: Soft. Bowel sounds are normal. She exhibits no distension. There is no tenderness. There is no rebound.  Musculoskeletal: She exhibits no edema.  Neurological: She is alert and oriented to person, place, and time.  5/5 and equal upper and lower extremity strength bilaterally. Equal grip strength bilaterally. Normal finger to nose and heel to shin. No pronator drift.   Skin: Skin is warm and dry.  Psychiatric: She has a normal mood and affect. Her behavior is normal.    ED Course  Procedures (including critical care time) Labs Review Labs Reviewed  CBC - Abnormal; Notable for the following:    Hemoglobin 11.8 (*)    HCT 35.5 (*)    RDW 16.6 (*)    Platelets 428 (*)    All other components within normal limits  BASIC METABOLIC PANEL - Abnormal; Notable for the following:    Sodium 133 (*)    Glucose, Bld 136 (*)    All other components within normal limits  POCT I-STAT TROPONIN I   Imaging Review Ct Head Wo Contrast  02/12/2013   CLINICAL DATA:  Left-sided headache and pressure in the left eye. Pain radiating to the neck and left shoulder.  EXAM: CT HEAD WITHOUT CONTRAST  TECHNIQUE: Contiguous axial images were obtained from the base of the skull through the vertex without intravenous contrast.  COMPARISON:  CT head 02/18/2012.  MRI brain 02/18/2012.  FINDINGS: Mild diffuse cerebral atrophy. Ventricles are not dilated. No mass effect or midline shift. No abnormal extra-axial fluid collections. Gray-white matter junctions are distinct. Basal cisterns are not effaced. No evidence of acute intracranial hemorrhage. No depressed skull fractures. Postoperative changes in the right lateral orbital rim. Old fracture deformity of the left zygomatic arch. Old fracture deformity of the left medial orbital wall. Visualized paranasal sinuses and mastoid air cells are not opacified.  IMPRESSION: No acute intracranial abnormalities. Stable appearance since previous  study.   Electronically Signed   By: Burman Nieves M.D.   On: 02/12/2013 23:01    EKG Interpretation   None       MDM   1. Headache     Patient in emergency department complaining of sudden onset of left-sided headache. Patient denies any neurological deficits. She denies any changes in vision. I obtained a pressure in the left eye using tonometer and it was average to 15. CT and labs unremarkable. Pain initially treated with Percocet in the waiting room which did not improve her pain. I also try some Valium for muscle spasms which helped some. I ordered her a migraine cocktail including Toradol, Reglan, Benadryl which she refused. She asked for "pain pill." I gave her another Norco which she states helps anymore.  Patient was fed in emergency department which improved her pain. She ambulated with normal gait except for a limp due to right ankle injury. Given her sudden onset of one-sided pain, discussed with her importance of lumbar puncture to rule out intracranial hemorrhage. Both myself and Dr. Preston Fleeting have discussed with her extensively the importance of this test to rule out any emergent process at this time. Patient refused it multiple times. She states she was to go home at this time and she will return if your symptoms are worsening. She will also follow closely with her primary care Dr.  Ceasar Mons Vitals:   02/12/13 2130 02/12/13 2215 02/12/13 2245 02/12/13 2330  BP: 105/75 116/80 123/80 119/79  Pulse: 73 73 71 78  Temp:      TempSrc:      Resp:      Height:      Weight:      SpO2: 100% 100% 100% 100%       Myriam Jacobson Benetta Maclaren, PA-C 02/13/13 0150

## 2013-02-13 MED ORDER — IBUPROFEN 800 MG PO TABS: 800.0000 mg | ORAL_TABLET | Freq: Three times a day (TID) | ORAL | Status: DC

## 2013-02-13 MED ORDER — DIPHENHYDRAMINE HCL 50 MG/ML IJ SOLN
12.5000 mg | Freq: Once | INTRAMUSCULAR | Status: DC
  Filled 2013-02-13: qty 1

## 2013-02-13 MED ORDER — DIAZEPAM 5 MG PO TABS: 5.0000 mg | ORAL_TABLET | Freq: Three times a day (TID) | ORAL | Status: DC | PRN

## 2013-02-13 MED ORDER — HYDROCODONE-ACETAMINOPHEN 5-325 MG PO TABS
1.0000 | ORAL_TABLET | Freq: Once | ORAL | Status: AC
  Administered 2013-02-13: 1 via ORAL
  Filled 2013-02-13: qty 1

## 2013-02-13 MED ORDER — METOCLOPRAMIDE HCL 5 MG/ML IJ SOLN
10.0000 mg | Freq: Once | INTRAMUSCULAR | Status: DC
  Filled 2013-02-13: qty 2

## 2013-02-13 MED ORDER — KETOROLAC TROMETHAMINE 30 MG/ML IJ SOLN
30.0000 mg | Freq: Once | INTRAMUSCULAR | Status: DC
  Filled 2013-02-13: qty 1

## 2013-02-13 NOTE — ED Notes (Signed)
Reglan 10mg , Toradol 30mg , and Benadryl 50mg  handed to [employer].

## 2013-02-13 NOTE — ED Provider Notes (Signed)
A 52 year old female presenting with sudden onset of headache at about noon today. He did not respond well to medications in the ED. CT was negative. Because of sudden onset in fact that she has not had similar headaches in the past, lumbar puncture was felt to be indicated. On my exam, she is awake, alert, oriented and without any focal neurologic findings. She was refusing lumbar puncture. I have explained the reason for lumbar puncture to find out occult bleeding which could indicate presence of an aneurysm and that failure to diagnose an aneurysm could be taken of extra. Patient expressed understanding but states that she still did not want to have a spinal tap done. She is allowed to leave him but is advised that should she change her mind about lumbar puncture, she should return immediately.  Medical screening examination/treatment/procedure(s) were conducted as a shared visit with non-physician practitioner(s) and myself.  I personally evaluated the patient during the encounter.   Dione Booze, MD 02/13/13 [phone]

## 2013-02-18 ENCOUNTER — Encounter: Payer: Medicaid Other | Admitting: Physical Therapy

## 2013-02-26 ENCOUNTER — Ambulatory Visit: Payer: Medicaid Other | Attending: Orthopedic Surgery | Admitting: Physical Therapy

## 2013-02-26 DIAGNOSIS — R609 Edema, unspecified: Secondary | ICD-10-CM | POA: Insufficient documentation

## 2013-02-26 DIAGNOSIS — IMO0001 Reserved for inherently not codable concepts without codable children: Secondary | ICD-10-CM | POA: Insufficient documentation

## 2013-02-26 DIAGNOSIS — R5381 Other malaise: Secondary | ICD-10-CM | POA: Insufficient documentation

## 2013-02-26 DIAGNOSIS — M25673 Stiffness of unspecified ankle, not elsewhere classified: Secondary | ICD-10-CM | POA: Insufficient documentation

## 2013-02-26 DIAGNOSIS — M25676 Stiffness of unspecified foot, not elsewhere classified: Secondary | ICD-10-CM | POA: Insufficient documentation

## 2013-02-26 DIAGNOSIS — M25579 Pain in unspecified ankle and joints of unspecified foot: Secondary | ICD-10-CM | POA: Insufficient documentation

## 2013-02-26 DIAGNOSIS — R262 Difficulty in walking, not elsewhere classified: Secondary | ICD-10-CM | POA: Insufficient documentation

## 2013-06-07 ENCOUNTER — Other Ambulatory Visit: Payer: Self-pay

## 2013-06-07 DIAGNOSIS — Z1231 Encounter for screening mammogram for malignant neoplasm of breast: Secondary | ICD-10-CM

## 2013-06-28 ENCOUNTER — Ambulatory Visit
Admission: RE | Admit: 2013-06-28 | Discharge: 2013-06-28 | Disposition: A | Discharge status: Home / Self Care | Payer: Medicaid Other | Source: Ambulatory Visit

## 2013-06-28 ENCOUNTER — Encounter (INDEPENDENT_AMBULATORY_CARE_PROVIDER_SITE_OTHER): Payer: Self-pay

## 2013-06-28 DIAGNOSIS — Z1231 Encounter for screening mammogram for malignant neoplasm of breast: Secondary | ICD-10-CM

## 2014-01-23 ENCOUNTER — Other Ambulatory Visit (HOSPITAL_COMMUNITY): Payer: Self-pay | Admitting: Cardiology

## 2014-01-23 DIAGNOSIS — R079 Chest pain, unspecified: Secondary | ICD-10-CM

## 2014-02-10 ENCOUNTER — Encounter (HOSPITAL_COMMUNITY)
Admission: RE | Admit: 2014-02-10 | Discharge: 2014-02-10 | Disposition: A | Discharge status: Home / Self Care | Payer: Medicaid Other | Source: Ambulatory Visit | Attending: Cardiology | Admitting: Cardiology

## 2014-02-10 DIAGNOSIS — R079 Chest pain, unspecified: Secondary | ICD-10-CM | POA: Insufficient documentation

## 2014-02-10 MED ORDER — TECHNETIUM TC 99M SESTAMIBI GENERIC - CARDIOLITE
30.0000 | Freq: Once | INTRAVENOUS | Status: AC | PRN
  Administered 2014-02-10: 30 via INTRAVENOUS

## 2014-02-10 MED ORDER — REGADENOSON 0.4 MG/5ML IV SOLN
0.4000 mg | Freq: Once | INTRAVENOUS | Status: AC
  Administered 2014-02-10: 0.4 mg via INTRAVENOUS

## 2014-02-10 MED ORDER — TECHNETIUM TC 99M SESTAMIBI GENERIC - CARDIOLITE
10.0000 | Freq: Once | INTRAVENOUS | Status: AC | PRN
  Administered 2014-02-10: 10 via INTRAVENOUS

## 2014-02-10 MED ORDER — REGADENOSON 0.4 MG/5ML IV SOLN
INTRAVENOUS | Status: AC
  Filled 2014-02-10: qty 5

## 2014-02-18 ENCOUNTER — Ambulatory Visit (HOSPITAL_COMMUNITY)
Admission: RE | Admit: 2014-02-18 | Discharge: 2014-02-18 | Disposition: A | Discharge status: Home / Self Care | Payer: Medicaid Other | Source: Ambulatory Visit | Attending: Cardiology | Admitting: Cardiology

## 2014-02-18 ENCOUNTER — Encounter (HOSPITAL_COMMUNITY)
Admission: RE | Disposition: A | Discharge status: Home / Self Care | Payer: Self-pay | Source: Ambulatory Visit | Attending: Cardiology

## 2014-02-18 ENCOUNTER — Encounter (HOSPITAL_COMMUNITY): Payer: Self-pay | Admitting: *Deleted

## 2014-02-18 DIAGNOSIS — M1990 Unspecified osteoarthritis, unspecified site: Secondary | ICD-10-CM | POA: Diagnosis not present

## 2014-02-18 DIAGNOSIS — K219 Gastro-esophageal reflux disease without esophagitis: Secondary | ICD-10-CM | POA: Insufficient documentation

## 2014-02-18 DIAGNOSIS — R079 Chest pain, unspecified: Secondary | ICD-10-CM | POA: Diagnosis not present

## 2014-02-18 DIAGNOSIS — I10 Essential (primary) hypertension: Secondary | ICD-10-CM | POA: Diagnosis not present

## 2014-02-18 DIAGNOSIS — E780 Pure hypercholesterolemia: Secondary | ICD-10-CM | POA: Insufficient documentation

## 2014-02-18 HISTORY — PX: LEFT HEART CATHETERIZATION WITH CORONARY ANGIOGRAM: SHX5451

## 2014-02-18 SURGERY — LEFT HEART CATHETERIZATION WITH CORONARY ANGIOGRAM
Anesthesia: LOCAL

## 2014-02-18 MED ORDER — LIDOCAINE HCL (PF) 1 % IJ SOLN
INTRAMUSCULAR | Status: AC
  Filled 2014-02-18: qty 30

## 2014-02-18 MED ORDER — SODIUM CHLORIDE 0.9 % IV SOLN
INTRAVENOUS | Status: DC
  Administered 2014-02-18: 100 mL/h via INTRAVENOUS

## 2014-02-18 MED ORDER — MIDAZOLAM HCL 5 MG/5ML IJ SOLN
INTRAMUSCULAR | Status: AC
  Filled 2014-02-18: qty 5

## 2014-02-18 MED ORDER — SODIUM CHLORIDE 0.9 % IV SOLN: 250.0000 mL | INTRAVENOUS | Status: DC | PRN

## 2014-02-18 MED ORDER — ONDANSETRON HCL 4 MG/2ML IJ SOLN: 4.0000 mg | Freq: Four times a day (QID) | INTRAMUSCULAR | Status: DC | PRN

## 2014-02-18 MED ORDER — NITROGLYCERIN 1 MG/10 ML FOR IR/CATH LAB
INTRA_ARTERIAL | Status: AC
  Filled 2014-02-18: qty 10

## 2014-02-18 MED ORDER — ACETAMINOPHEN 325 MG PO TABS: 650.0000 mg | ORAL_TABLET | ORAL | Status: DC | PRN

## 2014-02-18 MED ORDER — ASPIRIN 81 MG PO CHEW
CHEWABLE_TABLET | ORAL | Status: DC
Start: 2014-02-18 — End: 2014-02-18
  Filled 2014-02-18: qty 1

## 2014-02-18 MED ORDER — SODIUM CHLORIDE 0.9 % IV SOLN: INTRAVENOUS | Status: DC

## 2014-02-18 MED ORDER — FENTANYL CITRATE 0.05 MG/ML IJ SOLN
INTRAMUSCULAR | Status: AC
  Filled 2014-02-18: qty 2

## 2014-02-18 MED ORDER — SODIUM CHLORIDE 0.9 % IJ SOLN: 3.0000 mL | Freq: Two times a day (BID) | INTRAMUSCULAR | Status: DC

## 2014-02-18 MED ORDER — GUAIFENESIN-CODEINE 100-10 MG/5ML PO SOLN
ORAL | Status: AC
  Filled 2014-02-18: qty 5

## 2014-02-18 MED ORDER — HEPARIN (PORCINE) IN NACL 2-0.9 UNIT/ML-% IJ SOLN
INTRAMUSCULAR | Status: AC
  Filled 2014-02-18: qty 1000

## 2014-02-18 MED ORDER — ASPIRIN 81 MG PO CHEW
81.0000 mg | CHEWABLE_TABLET | ORAL | Status: AC
  Administered 2014-02-18: 81 mg via ORAL

## 2014-02-18 MED ORDER — SODIUM CHLORIDE 0.9 % IJ SOLN: 3.0000 mL | INTRAMUSCULAR | Status: DC | PRN

## 2014-02-18 NOTE — Cardiovascular Report (Signed)
NAMCarmelina Avery:  Lacerte, Darriona               ACCOUNT NO.:  [id number]  MEDICAL RECORD NO.:  [id number]  LOCATION:  [facility]                         FACILITY:  [facility]  PHYSICIAN:  Eduardo OsierMohan N. Sharyn LullHarwani, M.D. DATE OF BIRTH:  [birth year]  DATE OF PROCEDURE:  02/18/2014 DATE OF DISCHARGE:  02/18/2014                           CARDIAC CATHETERIZATION   PROCEDURE:  Left cardiac cath with selective left and right coronary angiography, left ventriculography via right groin using Judkins technique.  INDICATION FOR THE PROCEDURE:  [name] is a 54 year old female with past medical history significant for hypertension, hypercholesteremia, arthritis, and GERD.  She came to the office complaining of precordial chest pain off and on radiating to the left arm.  States occasionally the pain increases with deep breathing and local pressure.  Also gives history of exertional dyspnea and chest pain.  The patient has been to the ER multiple times and has been discharged.  Denies any cardiac workup in the past.  The patient subsequently underwent nuclear stress test which showed moderate size reversible ischemia in anterior and anterolateral wall with normal EF.  Due to recurrent chest pain, moderately positive stress test discussed with the patient regarding left cardiac cath, possible PTCA and stenting, its risks and benefits, i.e., death, MI, stroke, need for emergency CABG, local vascular complications, etc. and consented for PCI.  DESCRIPTION OF PROCEDURE:  After obtaining the informed consent, the patient was brought to the cath lab and was placed on fluoroscopy table. Right groin was prepped and draped in usual fashion.  A 1% Xylocaine was used for local anesthesia in the right groin.  With the help of thin wall needle, a 5-French arterial sheath was placed.  The sheath was aspirated and flushed.  Next, 5-French left Judkins catheter was advanced over the wire under fluoroscopic guidance up to the  ascending aorta.  Wire was pulled out.  The catheter was aspirated and connected to the Manifold.  Catheter was further advanced and engaged into left coronary ostium.  Multiple views of the left system were taken.  Next, the catheter was disengaged and was pulled out over the wire and was replaced with 5-French right Judkins catheter, which was advanced over the wire under fluoroscopic guidance up to the ascending aorta.  Wire was pulled out.  The catheter was aspirated and connected to the Manifold.  Catheter was further advanced and engaged into right coronary ostium.  Multiple views of the right system were taken.  Next, catheter was disengaged and was pulled out over the wire and was replaced with 5- French pigtail catheter, which was advanced over the wire under fluoroscopic guidance up to the ascending aorta.  Catheter was further advanced across the aortic valve into the LV.  LV pressures were recorded.  Next, LV graphy was done in 30-degree RAO position.  Post- angiographic pressures were recorded from LV and then pullback pressures were recorded from aorta.  There was no gradient across the aortic valve.  Next, the pigtail catheter was pulled out over the wire. Sheaths were aspirated and flushed.  FINDINGS:  LV showed good LV systolic function.  EF of 60-65%.  Left main was patent.  LAD was patent.  Diagonal  1 was moderate size, which was patent.  Diagonal 2 was very, very small which was patent.  Left circumflex was patent.  OM 1 was very, very small.  OM 2 was moderate sized, which was patent.  RCA was patent.  PLV and PDA branches were small, which were patent.  The patient tolerated the procedure well. There were no complications.  The patient was transferred to recovery room in stable condition.     Eduardo Osier. Sharyn Lull, M.D.     MNH/MEDQ  D:  02/18/2014  T:  02/18/2014  Job:  [id number]

## 2014-02-18 NOTE — CV Procedure (Signed)
Left cardiac cath report dictated on 02/18/2014 dictation number is [id number]

## 2014-02-18 NOTE — Interval H&P Note (Signed)
Cath Lab Visit (complete for each Cath Lab visit)  Clinical Evaluation Leading to the Procedure:    ACS: No.  Non-ACS:    Anginal Classification: CCS III  Anti-ischemic medical therapy: Minimal Therapy (1 class of medications)  Non-Invasive Test Results: Intermediate-risk stress test findings: cardiac mortality 1-3%/year  Prior CABG: No previous CABG      History and Physical Interval Note:  02/18/2014 10:22 AM  Tor NettersLinda N Oatley  has presented today for surgery, with the diagnosis of positive stress test/cp  The various methods of treatment have been discussed with the patient and family. After consideration of risks, benefits and other options for treatment, the patient has consented to  Procedure(s): LEFT HEART CATHETERIZATION WITH CORONARY ANGIOGRAM (N/A) as a surgical intervention .  The patient's history has been reviewed, patient examined, no change in status, stable for surgery.  I have reviewed the patient's chart and labs.  Questions were answered to the patient's satisfaction.     Robynn PaneHARWANI,Hiyab Nhem N

## 2014-02-18 NOTE — H&P (Signed)
Typed H&P in the chart needs to be scanned 

## 2014-02-18 NOTE — Discharge Instructions (Signed)
Angiogram, Care After °Refer to this sheet in the next few weeks. These instructions provide you with information on caring for yourself after your procedure. Your health care provider may also give you more specific instructions. Your treatment has been planned according to current medical practices, but problems sometimes occur. Call your health care provider if you have any problems or questions after your procedure.  °WHAT TO EXPECT AFTER THE PROCEDURE °After your procedure, it is typical to have the following sensations: °· Minor discomfort or tenderness and a small bump at the catheter insertion site. The bump should usually decrease in size and tenderness within 1 to 2 weeks. °· Any bruising will usually fade within 2 to 4 weeks. °HOME CARE INSTRUCTIONS  °· You may need to keep taking blood thinners if they were prescribed for you. Take medicines only as directed by your health care provider. °· Do not apply powder or lotion to the site. °· Do not take baths, swim, or use a hot tub until your health care provider approves. °· You may shower 24 hours after the procedure. Remove the bandage (dressing) and gently wash the site with plain soap and water. Gently pat the site dry. °· Inspect the site at least twice daily. °· Limit your activity for the first 48 hours. Do not bend, squat, or lift anything over 20 lb (9 kg) or as directed by your health care provider. °· Plan to have someone take you home after the procedure. Follow instructions about when you can drive or return to work. °SEEK MEDICAL CARE IF: °· You get light-headed when standing up. °· You have drainage (other than a small amount of blood on the dressing). °· You have chills. °· You have a fever. °· You have redness, warmth, swelling, or pain at the insertion site. °SEEK IMMEDIATE MEDICAL CARE IF:  °· You develop chest pain or shortness of breath, feel faint, or pass out. °· You have bleeding, swelling larger than a walnut, or drainage from the  catheter insertion site. °· You develop pain, discoloration, coldness, or severe bruising in the leg or arm that held the catheter. °· You have heavy bleeding from the site. If this happens, hold pressure on the site and call 911. °MAKE SURE YOU: °· Understand these instructions. °· Will watch your condition. °· Will get help right away if you are not doing well or get worse. °Document Released: 08/19/2004 Document Revised: 06/17/2013 Document Reviewed: 06/25/2012 °[employer]® Patient Information ©2015 [employer]. This information is not intended to replace advice given to you by your health care provider. Make sure you discuss any questions you have with your health care provider. ° °

## 2014-06-10 ENCOUNTER — Other Ambulatory Visit: Payer: Self-pay | Admitting: Physician Assistant

## 2014-06-10 DIAGNOSIS — R748 Abnormal levels of other serum enzymes: Secondary | ICD-10-CM

## 2014-06-11 ENCOUNTER — Ambulatory Visit
Admission: RE | Admit: 2014-06-11 | Discharge: 2014-06-11 | Disposition: A | Discharge status: Home / Self Care | Payer: Medicaid Other | Source: Ambulatory Visit | Attending: Physician Assistant | Admitting: Physician Assistant

## 2014-06-11 DIAGNOSIS — R748 Abnormal levels of other serum enzymes: Secondary | ICD-10-CM

## 2014-08-12 ENCOUNTER — Emergency Department (HOSPITAL_COMMUNITY)
Admission: EM | Admit: 2014-08-12 | Discharge: 2014-08-12 | Disposition: A | Discharge status: Home / Self Care | Payer: Medicaid Other | Attending: Emergency Medicine | Admitting: Emergency Medicine

## 2014-08-12 ENCOUNTER — Encounter (HOSPITAL_COMMUNITY): Payer: Self-pay

## 2014-08-12 ENCOUNTER — Emergency Department (HOSPITAL_COMMUNITY): Payer: Medicaid Other

## 2014-08-12 DIAGNOSIS — Z720 Tobacco use: Secondary | ICD-10-CM | POA: Diagnosis not present

## 2014-08-12 DIAGNOSIS — Z7982 Long term (current) use of aspirin: Secondary | ICD-10-CM | POA: Insufficient documentation

## 2014-08-12 DIAGNOSIS — Z8659 Personal history of other mental and behavioral disorders: Secondary | ICD-10-CM | POA: Insufficient documentation

## 2014-08-12 DIAGNOSIS — E780 Pure hypercholesterolemia: Secondary | ICD-10-CM | POA: Diagnosis not present

## 2014-08-12 DIAGNOSIS — R0789 Other chest pain: Secondary | ICD-10-CM

## 2014-08-12 DIAGNOSIS — Z9889 Other specified postprocedural states: Secondary | ICD-10-CM | POA: Diagnosis not present

## 2014-08-12 DIAGNOSIS — Z8719 Personal history of other diseases of the digestive system: Secondary | ICD-10-CM | POA: Diagnosis not present

## 2014-08-12 DIAGNOSIS — Z91040 Latex allergy status: Secondary | ICD-10-CM | POA: Insufficient documentation

## 2014-08-12 DIAGNOSIS — I10 Essential (primary) hypertension: Secondary | ICD-10-CM | POA: Diagnosis not present

## 2014-08-12 DIAGNOSIS — Z79899 Other long term (current) drug therapy: Secondary | ICD-10-CM | POA: Insufficient documentation

## 2014-08-12 DIAGNOSIS — R079 Chest pain, unspecified: Secondary | ICD-10-CM | POA: Diagnosis present

## 2014-08-12 HISTORY — DX: Pure hypercholesterolemia, unspecified: E78.00

## 2014-08-12 HISTORY — DX: Essential (primary) hypertension: I10

## 2014-08-12 LAB — I-STAT TROPONIN, ED: Troponin i, poc: 0 ng/mL (ref 0.00–0.08)

## 2014-08-12 LAB — BASIC METABOLIC PANEL
Anion gap: 8 (ref 5–15)
CO2: 24 mmol/L (ref 22–32)
Calcium: 8.7 mg/dL — ABNORMAL LOW (ref 8.9–10.3)
Chloride: 107 mmol/L (ref 101–111)
Creatinine, Ser: 0.57 mg/dL (ref 0.44–1.00)
Glucose, Bld: 106 mg/dL — ABNORMAL HIGH (ref 65–99)
Potassium: 3.7 mmol/L (ref 3.5–5.1)
SODIUM: 139 mmol/L (ref 135–145)

## 2014-08-12 LAB — D-DIMER, QUANTITATIVE: D-Dimer, Quant: 0.39 ug/mL-FEU (ref 0.00–0.48)

## 2014-08-12 LAB — CBC
HCT: 38.9 % (ref 36.0–46.0)
Hemoglobin: 13 g/dL (ref 12.0–15.0)
MCH: 27.8 pg (ref 26.0–34.0)
MCHC: 33.4 g/dL (ref 30.0–36.0)
MCV: 83.3 fL (ref 78.0–100.0)
PLATELETS: 305 10*3/uL (ref 150–400)
RBC: 4.67 MIL/uL (ref 3.87–5.11)
RDW: 15.4 % (ref 11.5–15.5)
WBC: 4.7 10*3/uL (ref 4.0–10.5)

## 2014-08-12 MED ORDER — IBUPROFEN 600 MG PO TABS: 600.0000 mg | ORAL_TABLET | Freq: Four times a day (QID) | ORAL | Status: DC | PRN

## 2014-08-12 MED ORDER — SODIUM CHLORIDE 0.9 % IV BOLUS (SEPSIS)
1000.0000 mL | Freq: Once | INTRAVENOUS | Status: AC
  Administered 2014-08-12: 1000 mL via INTRAVENOUS

## 2014-08-12 MED ORDER — MORPHINE SULFATE 4 MG/ML IJ SOLN
4.0000 mg | Freq: Once | INTRAMUSCULAR | Status: AC
  Administered 2014-08-12: 4 mg via INTRAVENOUS
  Filled 2014-08-12: qty 1

## 2014-08-12 MED ORDER — HYDROCODONE-ACETAMINOPHEN 5-325 MG PO TABS: 1.0000 | ORAL_TABLET | Freq: Four times a day (QID) | ORAL | Status: DC | PRN

## 2014-08-12 MED ORDER — KETOROLAC TROMETHAMINE 30 MG/ML IJ SOLN
30.0000 mg | Freq: Once | INTRAMUSCULAR | Status: AC
  Administered 2014-08-12: 30 mg via INTRAVENOUS
  Filled 2014-08-12: qty 1

## 2014-08-12 NOTE — ED Notes (Signed)
Pt requesting to be XXX, registration notified.

## 2014-08-12 NOTE — ED Provider Notes (Signed)
CSN: [id number]     Arrival date & time 08/12/14  1507 History   First MD Initiated Contact with Patient 08/12/14 1617     Chief Complaint  Patient presents with  . Chest Pain     (Consider location/radiation/quality/duration/timing/severity/associated sxs/prior Treatment) HPI  54 year old female presents with chest pain for the past 3 days. The pain is sharp over her left chest/breast and has been constant. She tried nitroglycerin without any relief. In January 2016 she had a negative left heart cath. The patient states that the pain runs from her chest all the way down to her left foot. Runs down the anterior aspect of her body, no back pain. No abdominal pain, vomiting, or shortness of breath. Denies cough or fever. Patient rates her pain as severe. Patient keeps asking me if she is dying, to go ahead and just tell her that she is dying. When I try to reassure her she takes little comfort in this. She denies being suicidal.  Past Medical History  Diagnosis Date  . Depression   . S/P colonoscopic polypectomy 2013  . Chronic constipation   . Wears glasses   . Hypertension   . Hypercholesteremia    Past Surgical History  Procedure Laterality Date  . Orbital fracture surgery  2001    rt eye-fx-plate   . Tonsillectomy    . Colonoscopy      x2  . Left heart catheterization with coronary angiogram N/A 02/18/2014    Procedure: LEFT HEART CATHETERIZATION WITH CORONARY ANGIOGRAM;  Surgeon: Robynn PaneMohan N Harwani, MD;  [facility];  Service: Cardiovascular;  Laterality: N/A;   No family history on file. History  Substance Use Topics  . Smoking status: Current Some Day Smoker    Types: Cigarettes  . Smokeless tobacco: Not on file  . Alcohol Use: Yes     Comment: beer-   OB History    No data available     Review of Systems  Constitutional: Negative for fever.  Respiratory: Negative for cough and shortness of breath.   Cardiovascular: Positive for chest pain. Negative for leg  swelling.  Gastrointestinal: Negative for vomiting.  All other systems reviewed and are negative.     Allergies  Latex  Home Medications   Prior to Admission medications   Medication Sig Start Date End Date Taking? Authorizing Provider  acetaminophen (TYLENOL) 500 MG tablet Take 1,000 mg by mouth daily as needed for moderate pain.    Yes Historical Provider, MD  aspirin EC 81 MG tablet Take 81 mg by mouth daily.   Yes Historical Provider, MD  diphenoxylate-atropine (LOMOTIL) 2.5-0.025 MG per tablet Take 1 tablet by mouth daily as needed for diarrhea or loose stools.    Yes Historical Provider, MD  gemfibrozil (LOPID) 600 MG tablet Take 600 mg by mouth 2 (two) times daily. 01/15/14  Yes Historical Provider, MD  meloxicam (MOBIC) 15 MG tablet Take 15 mg by mouth daily.   Yes Historical Provider, MD  metoprolol succinate (TOPROL-XL) 25 MG 24 hr tablet Take 25 mg by mouth daily.   Yes Historical Provider, MD  NITROSTAT 0.4 MG SL tablet Place 0.4 mg under the tongue every 5 (five) minutes x 3 doses as needed. 02/13/14  Yes Historical Provider, MD   BP 105/80 mmHg  Pulse 90  Temp(Src) 97.9 F (36.6 C) (Oral)  Resp 22  SpO2 100%  LMP 02/22/2012 Physical Exam  Constitutional: She is oriented to person, place, and time. She appears well-developed and well-nourished.  HENT:  Head: Normocephalic and atraumatic.  Right Ear: External ear normal.  Left Ear: External ear normal.  Nose: Nose normal.  Eyes: Right eye exhibits no discharge. Left eye exhibits no discharge.  Cardiovascular: Normal rate, regular rhythm and normal heart sounds.   Pulses:      Dorsalis pedis pulses are 2+ on the right side, and 2+ on the left side.  Pulmonary/Chest: Effort normal and breath sounds normal. She exhibits tenderness (over left anterior chest).  Abdominal: Soft. There is no tenderness.  Musculoskeletal:  Diffuse tenderness to left thigh and calf with minimal palpation. No edema or swelling compared to  right  Neurological: She is alert and oriented to person, place, and time.  Skin: Skin is warm and dry.  Psychiatric: She exhibits a depressed mood (averts eyes away from me during conversation).  Nursing note and vitals reviewed.   ED Course  Procedures (including critical care time) Labs Review Labs Reviewed  BASIC METABOLIC PANEL - Abnormal; Notable for the following:    Glucose, Bld 106 (*)    BUN <5 (*)    Calcium 8.7 (*)    All other components within normal limits  CBC  D-DIMER, QUANTITATIVE (NOT AT [facility])  Rosezena Sensor, ED    Imaging Review Dg Chest 2 View  08/12/2014   CLINICAL DATA:  Chest pain for 2 days.  EXAM: CHEST  2 VIEW  COMPARISON:  03/06/2012  FINDINGS: Cardiomediastinal silhouette is normal. Mediastinal contours appear intact.  There is no evidence of focal airspace consolidation, pleural effusion or pneumothorax.  Osseous structures are without acute abnormality. Soft tissues are grossly normal.  IMPRESSION: No radiographic evidence of acute cardiopulmonary abnormality.   Electronically Signed   By: Ted Mcalpine M.D.   On: 08/12/2014 16:03     EKG Interpretation   Date/Time:  Tuesday August 12 2014 15:11:51 EDT Ventricular Rate:  96 PR Interval:  128 QRS Duration: 76 QT Interval:  350 QTC Calculation: 442 R Axis:   78 Text Interpretation:  Normal sinus rhythm Normal ECG no significant change  since Jan 2016 Confirmed by Criss Alvine  MD, Caro Brundidge ([id number]) on 08/12/2014  4:17:47 PM      MDM   Final diagnoses:  Chest wall pain    Patient with 3 days of constant, atypical left-sided chest pain. Pain seems to radiate from her chest although it to her toes. EKG is unremarkable and she had a cath just a few months ago that was normal with no coronary disease. Given the atypical nature a d-dimer was sent as low risk pulmonary embolism workup and is negative. Do not feel further workup is needed. Patient feels somewhat better, will discharge with ibuprofen  and small amount of narcotics for severe pain. Likely chest wall in etiology. Recommend close follow-up with her cardiologist and PCP.    Pricilla Loveless, MD 08/13/14 [phone]

## 2014-08-12 NOTE — ED Notes (Signed)
Assisted patient to rest room via wheel chair. Collected urine sample, placed bedside.

## 2014-08-12 NOTE — ED Notes (Signed)
MD at bedside. 

## 2014-08-12 NOTE — ED Notes (Signed)
Pt states she started having left sided chest pain 3 days ago and it has been sharp running all the way down to her toe. Pt states she has a bad heart and states, " don't ask me no questions".

## 2014-08-12 NOTE — Discharge Instructions (Signed)

## 2014-08-26 ENCOUNTER — Other Ambulatory Visit: Payer: Self-pay

## 2014-08-26 DIAGNOSIS — Z1231 Encounter for screening mammogram for malignant neoplasm of breast: Secondary | ICD-10-CM

## 2014-09-03 ENCOUNTER — Ambulatory Visit: Payer: Medicaid Other

## 2014-09-16 ENCOUNTER — Ambulatory Visit
Admission: RE | Admit: 2014-09-16 | Discharge: 2014-09-16 | Disposition: A | Discharge status: Home / Self Care | Payer: Medicaid Other | Source: Ambulatory Visit

## 2014-09-16 DIAGNOSIS — Z1231 Encounter for screening mammogram for malignant neoplasm of breast: Secondary | ICD-10-CM

## 2014-10-16 ENCOUNTER — Encounter (HOSPITAL_COMMUNITY): Payer: Self-pay | Admitting: Emergency Medicine

## 2014-10-16 ENCOUNTER — Emergency Department (HOSPITAL_COMMUNITY)
Admission: EM | Admit: 2014-10-16 | Discharge: 2014-10-16 | Disposition: A | Discharge status: Home / Self Care | Payer: Medicaid Other | Attending: Emergency Medicine | Admitting: Emergency Medicine

## 2014-10-16 ENCOUNTER — Emergency Department (HOSPITAL_COMMUNITY): Payer: Medicaid Other

## 2014-10-16 DIAGNOSIS — M542 Cervicalgia: Secondary | ICD-10-CM | POA: Insufficient documentation

## 2014-10-16 DIAGNOSIS — R51 Headache: Secondary | ICD-10-CM | POA: Insufficient documentation

## 2014-10-16 DIAGNOSIS — R519 Headache, unspecified: Secondary | ICD-10-CM

## 2014-10-16 DIAGNOSIS — Z791 Long term (current) use of non-steroidal anti-inflammatories (NSAID): Secondary | ICD-10-CM | POA: Diagnosis not present

## 2014-10-16 DIAGNOSIS — Z720 Tobacco use: Secondary | ICD-10-CM | POA: Insufficient documentation

## 2014-10-16 DIAGNOSIS — Z8639 Personal history of other endocrine, nutritional and metabolic disease: Secondary | ICD-10-CM | POA: Insufficient documentation

## 2014-10-16 DIAGNOSIS — Z7982 Long term (current) use of aspirin: Secondary | ICD-10-CM | POA: Diagnosis not present

## 2014-10-16 DIAGNOSIS — I10 Essential (primary) hypertension: Secondary | ICD-10-CM | POA: Diagnosis not present

## 2014-10-16 DIAGNOSIS — Z79899 Other long term (current) drug therapy: Secondary | ICD-10-CM | POA: Insufficient documentation

## 2014-10-16 DIAGNOSIS — Z91040 Latex allergy status: Secondary | ICD-10-CM | POA: Insufficient documentation

## 2014-10-16 DIAGNOSIS — Z8719 Personal history of other diseases of the digestive system: Secondary | ICD-10-CM | POA: Insufficient documentation

## 2014-10-16 DIAGNOSIS — Z8659 Personal history of other mental and behavioral disorders: Secondary | ICD-10-CM | POA: Insufficient documentation

## 2014-10-16 DIAGNOSIS — Z973 Presence of spectacles and contact lenses: Secondary | ICD-10-CM | POA: Insufficient documentation

## 2014-10-16 LAB — SEDIMENTATION RATE: Sed Rate: 25 mm/hr — ABNORMAL HIGH (ref 0–22)

## 2014-10-16 MED ORDER — DEXAMETHASONE SODIUM PHOSPHATE 10 MG/ML IJ SOLN
10.0000 mg | Freq: Once | INTRAMUSCULAR | Status: AC
Start: 2014-10-16 — End: 2014-10-16
  Administered 2014-10-16: 10 mg via INTRAVENOUS
  Filled 2014-10-16: qty 1

## 2014-10-16 MED ORDER — SODIUM CHLORIDE 0.9 % IV BOLUS (SEPSIS)
1000.0000 mL | Freq: Once | INTRAVENOUS | Status: AC
  Administered 2014-10-16: 1000 mL via INTRAVENOUS

## 2014-10-16 MED ORDER — DIPHENHYDRAMINE HCL 50 MG/ML IJ SOLN
25.0000 mg | Freq: Once | INTRAMUSCULAR | Status: AC
  Administered 2014-10-16: 25 mg via INTRAVENOUS
  Filled 2014-10-16: qty 1

## 2014-10-16 MED ORDER — METOCLOPRAMIDE HCL 5 MG/ML IJ SOLN
10.0000 mg | Freq: Once | INTRAMUSCULAR | Status: AC
  Administered 2014-10-16: 10 mg via INTRAVENOUS
  Filled 2014-10-16: qty 2

## 2014-10-16 MED ORDER — OXYCODONE-ACETAMINOPHEN 5-325 MG PO TABS
1.0000 | ORAL_TABLET | Freq: Once | ORAL | Status: AC
  Administered 2014-10-16: 1 via ORAL
  Filled 2014-10-16: qty 1

## 2014-10-16 NOTE — ED Provider Notes (Signed)
CSN: [id number]     Arrival date & time 10/16/14  1851 History   First MD Initiated Contact with Patient 10/16/14 1942     Chief Complaint  Patient presents with  . Headache     (Consider location/radiation/quality/duration/timing/severity/associated sxs/prior Treatment) Patient is a 54 y.o. female presenting with headaches.  Headache Pain location:  R temporal Quality:  Dull Radiates to:  Does not radiate Severity currently:  10/10 Severity at highest:  10/10 Onset quality:  Gradual Duration:  3 days Timing:  Constant Progression:  Worsening Chronicity:  New Similar to prior headaches: no   Context: not exposure to bright light   Relieved by:  Nothing Worsened by:  Nothing Ineffective treatments:  NSAIDs Associated symptoms: neck pain   Associated symptoms: no abdominal pain, no congestion, no cough, no diarrhea, no fever, no myalgias, no nausea, no photophobia, no sore throat, no visual change and no vomiting   Risk factors: no family hx of SAH     Past Medical History  Diagnosis Date  . Depression   . S/P colonoscopic polypectomy 2013  . Chronic constipation   . Wears glasses   . Hypertension   . Hypercholesteremia    Past Surgical History  Procedure Laterality Date  . Orbital fracture surgery  2001    rt eye-fx-plate   . Tonsillectomy    . Colonoscopy      x2  . Left heart catheterization with coronary angiogram N/A 02/18/2014    Procedure: LEFT HEART CATHETERIZATION WITH CORONARY ANGIOGRAM;  Surgeon: Clent Demark, MD;  Location: [facility] CATH LAB;  Service: Cardiovascular;  Laterality: N/A;   No family history on file. Social History  Substance Use Topics  . Smoking status: Current Some Day Smoker    Types: Cigarettes  . Smokeless tobacco: None  . Alcohol Use: Yes     Comment: beer-   OB History    No data available     Review of Systems  Constitutional: Negative for fever and chills.  HENT: Negative for congestion and sore throat.   Eyes: Negative  for photophobia and visual disturbance.  Respiratory: Negative for cough, shortness of breath and wheezing.   Cardiovascular: Negative for chest pain.  Gastrointestinal: Negative for nausea, vomiting, abdominal pain, diarrhea and constipation.  Genitourinary: Negative for dysuria, difficulty urinating and vaginal pain.  Musculoskeletal: Positive for neck pain. Negative for myalgias and arthralgias.  Skin: Negative for rash.  Neurological: Positive for headaches. Negative for syncope.  Psychiatric/Behavioral: Negative for behavioral problems.  All other systems reviewed and are negative.     Allergies  Latex  Home Medications   Prior to Admission medications   Medication Sig Start Date End Date Taking? Authorizing Provider  acetaminophen (TYLENOL) 500 MG tablet Take 1,000 mg by mouth daily as needed for moderate pain.    Yes Historical Provider, MD  aspirin EC 81 MG tablet Take 81 mg by mouth daily.   Yes Historical Provider, MD  diphenoxylate-atropine (LOMOTIL) 2.5-0.025 MG per tablet Take 1 tablet by mouth daily as needed for diarrhea or loose stools.    Yes Historical Provider, MD  gemfibrozil (LOPID) 600 MG tablet Take 600 mg by mouth 2 (two) times daily. 01/15/14  Yes Historical Provider, MD  ibuprofen (ADVIL,MOTRIN) 200 MG tablet Take 200 mg by mouth every 6 (six) hours as needed for headache, mild pain or moderate pain.   Yes Historical Provider, MD  meloxicam (MOBIC) 15 MG tablet Take 15 mg by mouth daily as needed for pain.  Yes Historical Provider, MD  metoprolol succinate (TOPROL-XL) 25 MG 24 hr tablet Take 12.5 mg by mouth daily.    Yes Historical Provider, MD  NITROSTAT 0.4 MG SL tablet Place 0.4 mg under the tongue every 5 (five) minutes x 3 doses as needed for chest pain.  02/13/14  Yes Historical Provider, MD  omeprazole (PRILOSEC) 20 MG capsule Take 20 mg by mouth daily as needed (acid reflux, heartburn).  09/10/14  Yes Historical Provider, MD  PROAIR HFA 108 (90 BASE)  MCG/ACT inhaler Inhale 1-2 puffs into the lungs every 6 (six) hours as needed for wheezing or shortness of breath.  09/10/14  Yes Historical Provider, MD  RA VITAMIN D-3 1000 UNITS tablet Take 1,000 Units by mouth daily. 09/10/14  Yes Historical Provider, MD  HYDROcodone-acetaminophen (NORCO) 5-325 MG per tablet Take 1 tablet by mouth every 6 (six) hours as needed for severe pain. Patient not taking: Reported on 10/16/2014 08/12/14   Sherwood Gambler, MD  ibuprofen (ADVIL,MOTRIN) 600 MG tablet Take 1 tablet (600 mg total) by mouth every 6 (six) hours as needed. Patient not taking: Reported on 10/16/2014 08/12/14   Sherwood Gambler, MD   BP 97/61 mmHg  Pulse 74  Temp(Src) 98.5 F (36.9 C) (Oral)  Resp 16  SpO2 96%  LMP 02/22/2012 Physical Exam  Constitutional: She is oriented to person, place, and time. She appears well-developed and well-nourished. No distress.  HENT:  Head: Normocephalic and atraumatic.  Eyes: EOM are normal.  Neck: Normal range of motion.  Cardiovascular: Normal rate, regular rhythm and normal heart sounds.   No murmur heard. Pulmonary/Chest: Effort normal and breath sounds normal. No respiratory distress. She has no wheezes.  Abdominal: Soft. There is no tenderness.  Musculoskeletal: She exhibits no edema.  Neurological: She is alert and oriented to person, place, and time. She has normal strength and normal reflexes. No cranial nerve deficit or sensory deficit. She displays a negative Romberg sign. GCS eye subscore is 4. GCS verbal subscore is 5. GCS motor subscore is 6. She displays no Babinski's sign on the right side. She displays no Babinski's sign on the left side.  Skin: She is not diaphoretic.  Psychiatric: She has a normal mood and affect. Her behavior is normal.    ED Course  Procedures (including critical care time) Labs Review Labs Reviewed  SEDIMENTATION RATE - Abnormal; Notable for the following:    Sed Rate 25 (*)    All other components within normal limits     Imaging Review Ct Head Wo Contrast  10/16/2014   CLINICAL DATA:  54 yr old c/o headache on right side of head onset 3-4 days ago. St's pain started on left side of head now on right side. Patent alert and oriented x3. States she has taken OTC meds without relief female Hx: HTN  EXAM: CT HEAD WITHOUT CONTRAST  TECHNIQUE: Contiguous axial images were obtained from the base of the skull through the vertex without intravenous contrast.  COMPARISON:  02/12/2013  FINDINGS: Ventricles are normal in configuration. There is mild ventricular and sulcal enlargement somewhat greater than generally seen in this patient's age group, but stable from the prior study.  There are no parenchymal masses or mass effect. There is no evidence of an infarct. There are no extra-axial masses or abnormal fluid collections.  There is no intracranial hemorrhage.  There is an old medial left orbital wall fracture, stable. Visualized sinuses and mastoid air cells are clear.  IMPRESSION: 1. No acute intracranial  abnormalities. 2. Mild volume loss, advanced for age.   Electronically Signed   By: Lajean Manes M.D.   On: 10/16/2014 21:42   I have personally reviewed and evaluated these images and lab results as part of my medical decision-making.   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 Patient states it started very gradually and is steadily worsened. Patient denies photophobia or phonophobia. Patient states she's never had a headache like this before. Patient has no focal neuro deficits per her report. Patient denies fever chills or neck stiffness. Patient denies any head trauma and is not taking any anticoagulation. Patient denies family history of bleed. On route to the ED the patient is afebrile  stable vital signs. Patient does not appear to be any acute distress on exam. Patient's neurologic exam is completely normal. Patient is able to ambulate without assistance. Patient does have tenderness to palpation of the right temporal area however no cord is felt. We will get a ESR to evaluate for temporal arteritis. Given the patient's age and no history of headache we will get a CT scan to rule out other etiology. Patient will be given a headache cocktail and we will reassess.  CT scan with no acute abnormality. Patient's inflammatory markers are not significantly elevated. The patient has no headache at this time. Given the normal workup and no neurologic exam findings and complete resolution of pain is felt the patient is stable for discharge with PCP follow-up.  Renne Musca, MD 10/16/14 0076  Carmin Muskrat, MD 10/17/14 [phone]

## 2014-10-16 NOTE — Discharge Instructions (Signed)

## 2014-10-16 NOTE — ED Notes (Addendum)
Pt c/o headache on right side of head onset 3-4 days ago.  St's pain started on left side of head now on right side.  Pt alert and oriented x3.  St's she has taken OTC meds without relief

## 2014-12-27 ENCOUNTER — Emergency Department (HOSPITAL_COMMUNITY)
Admission: EM | Admit: 2014-12-27 | Discharge: 2014-12-27 | Disposition: A | Discharge status: Home / Self Care | Payer: Medicaid Other | Attending: Emergency Medicine | Admitting: Emergency Medicine

## 2014-12-27 ENCOUNTER — Encounter (HOSPITAL_COMMUNITY): Payer: Self-pay | Admitting: *Deleted

## 2014-12-27 ENCOUNTER — Emergency Department (HOSPITAL_COMMUNITY): Payer: Medicaid Other

## 2014-12-27 DIAGNOSIS — I10 Essential (primary) hypertension: Secondary | ICD-10-CM | POA: Insufficient documentation

## 2014-12-27 DIAGNOSIS — E7800 Pure hypercholesterolemia, unspecified: Secondary | ICD-10-CM | POA: Diagnosis not present

## 2014-12-27 DIAGNOSIS — R0789 Other chest pain: Secondary | ICD-10-CM | POA: Insufficient documentation

## 2014-12-27 DIAGNOSIS — Z79899 Other long term (current) drug therapy: Secondary | ICD-10-CM | POA: Diagnosis not present

## 2014-12-27 DIAGNOSIS — R079 Chest pain, unspecified: Secondary | ICD-10-CM | POA: Diagnosis present

## 2014-12-27 DIAGNOSIS — Z7982 Long term (current) use of aspirin: Secondary | ICD-10-CM | POA: Insufficient documentation

## 2014-12-27 DIAGNOSIS — Z9889 Other specified postprocedural states: Secondary | ICD-10-CM | POA: Insufficient documentation

## 2014-12-27 DIAGNOSIS — Z91040 Latex allergy status: Secondary | ICD-10-CM | POA: Insufficient documentation

## 2014-12-27 DIAGNOSIS — Z8659 Personal history of other mental and behavioral disorders: Secondary | ICD-10-CM | POA: Insufficient documentation

## 2014-12-27 DIAGNOSIS — M79605 Pain in left leg: Secondary | ICD-10-CM | POA: Insufficient documentation

## 2014-12-27 DIAGNOSIS — Z720 Tobacco use: Secondary | ICD-10-CM | POA: Insufficient documentation

## 2014-12-27 DIAGNOSIS — R197 Diarrhea, unspecified: Secondary | ICD-10-CM | POA: Insufficient documentation

## 2014-12-27 LAB — I-STAT TROPONIN, ED: Troponin i, poc: 0.01 ng/mL (ref 0.00–0.08)

## 2014-12-27 LAB — CBC
HEMATOCRIT: 36.8 % (ref 36.0–46.0)
HEMOGLOBIN: 11.9 g/dL — AB (ref 12.0–15.0)
MCH: 27.9 pg (ref 26.0–34.0)
MCHC: 32.3 g/dL (ref 30.0–36.0)
MCV: 86.4 fL (ref 78.0–100.0)
Platelets: 244 10*3/uL (ref 150–400)
RBC: 4.26 MIL/uL (ref 3.87–5.11)
RDW: 14.6 % (ref 11.5–15.5)
WBC: 6.1 10*3/uL (ref 4.0–10.5)

## 2014-12-27 LAB — D-DIMER, QUANTITATIVE: D-Dimer, Quant: <0.27 ug/mL-FEU (ref 0.00–0.48)

## 2014-12-27 LAB — BASIC METABOLIC PANEL
ANION GAP: 10 (ref 5–15)
BUN: 7 mg/dL (ref 6–20)
CHLORIDE: 104 mmol/L (ref 101–111)
CO2: 27 mmol/L (ref 22–32)
Calcium: 9 mg/dL (ref 8.9–10.3)
Creatinine, Ser: 0.58 mg/dL (ref 0.44–1.00)
GFR calc Af Amer: >60 mL/min (ref >60)
GFR calc non Af Amer: >60 mL/min (ref >60)
GLUCOSE: 98 mg/dL (ref 65–99)
POTASSIUM: 3.6 mmol/L (ref 3.5–5.1)
Sodium: 141 mmol/L (ref 135–145)

## 2014-12-27 MED ORDER — HYDROCODONE-ACETAMINOPHEN 5-325 MG PO TABS: 1.0000 | ORAL_TABLET | Freq: Four times a day (QID) | ORAL | Status: DC | PRN

## 2014-12-27 MED ORDER — HYDROCODONE-ACETAMINOPHEN 5-325 MG PO TABS
1.0000 | ORAL_TABLET | Freq: Once | ORAL | Status: AC
  Administered 2014-12-27: 1 via ORAL
  Filled 2014-12-27: qty 1

## 2014-12-27 NOTE — Discharge Instructions (Signed)
1. Medications: Norco for pain as needed - do not drink, drive or operate machinery on this medication - it can make you drowsy; continue usual home medications 2. Treatment: rest, drink plenty of fluids 3. Follow Up: Please follow up with your primary doctor in 3 days for discussion of your diagnoses and further evaluation after today's visit; Please return to the ER for new or worsening symptoms, any additional concerns.

## 2014-12-27 NOTE — ED Notes (Signed)
Pt reports having sharp stabbing pains to entire left side of body from her face and down into chest and leg. When asked questions, pt responds by screaming "that we are looking at her like she is stupid and that she just has pain to entire left side!" no acute distress noted and ekg done.

## 2014-12-27 NOTE — ED Provider Notes (Signed)
CSN: [id number]     Arrival date & time 12/27/14  1504 History   First MD Initiated Contact with Patient 12/27/14 1644     Chief Complaint  Patient presents with  . Pain     (Consider location/radiation/quality/duration/timing/severity/associated sxs/prior Treatment) The history is provided by the patient and medical records. No language interpreter was used.   Betty Avery is a 54 y.o. female  with a PMH of HTN, HLD, previous heart cath presents to the Emergency Department complaining of sharp intermittent pain left chest pain that radiates from chest all the way down to left calf x 3 days. Pt. States pain is worse with walking. Tried Goody's powder with no relief, no alleviating factors noted.    Past Medical History  Diagnosis Date  . Depression   . S/P colonoscopic polypectomy 2013  . Chronic constipation   . Wears glasses   . Hypertension   . Hypercholesteremia    Past Surgical History  Procedure Laterality Date  . Orbital fracture surgery  2001    rt eye-fx-plate   . Tonsillectomy    . Colonoscopy      x2  . Left heart catheterization with coronary angiogram N/A 02/18/2014    Procedure: LEFT HEART CATHETERIZATION WITH CORONARY ANGIOGRAM;  Surgeon: Robynn Pane, MD;  Location: [facility] CATH LAB;  Service: Cardiovascular;  Laterality: N/A;   History reviewed. No pertinent family history. Social History  Substance Use Topics  . Smoking status: Current Some Day Smoker    Types: Cigarettes  . Smokeless tobacco: None  . Alcohol Use: Yes     Comment: beer-   OB History    No data available     Review of Systems  Constitutional: Negative.   HENT: Negative for congestion, rhinorrhea and sore throat.   Eyes: Negative for visual disturbance.  Respiratory: Negative for cough, shortness of breath and wheezing.   Cardiovascular: Positive for chest pain. Negative for palpitations and leg swelling.  Gastrointestinal: Positive for diarrhea. Negative for nausea, vomiting,  abdominal pain and constipation.  Endocrine: Negative for polydipsia and polyuria.  Musculoskeletal: Positive for myalgias, back pain and arthralgias. Negative for neck pain.  Skin: Negative for rash.  Neurological: Negative for dizziness, weakness and headaches.      Allergies  Latex  Home Medications   Prior to Admission medications   Medication Sig Start Date End Date Taking? Authorizing Provider  acetaminophen (TYLENOL) 500 MG tablet Take 1,000 mg by mouth daily as needed for moderate pain.     Historical Provider, MD  aspirin EC 81 MG tablet Take 81 mg by mouth daily.    Historical Provider, MD  diphenoxylate-atropine (LOMOTIL) 2.5-0.025 MG per tablet Take 1 tablet by mouth daily as needed for diarrhea or loose stools.     Historical Provider, MD  gemfibrozil (LOPID) 600 MG tablet Take 600 mg by mouth 2 (two) times daily. 01/15/14   Historical Provider, MD  HYDROcodone-acetaminophen (NORCO/VICODIN) 5-325 MG tablet Take 1 tablet by mouth every 6 (six) hours as needed for severe pain. 12/27/14   Laranda Burkemper Pilcher Shahad Mazurek, PA-C  meloxicam (MOBIC) 15 MG tablet Take 15 mg by mouth daily as needed for pain.     Historical Provider, MD  metoprolol succinate (TOPROL-XL) 25 MG 24 hr tablet Take 12.5 mg by mouth daily.     Historical Provider, MD  NITROSTAT 0.4 MG SL tablet Place 0.4 mg under the tongue every 5 (five) minutes x 3 doses as needed for chest pain.  02/13/14  Historical Provider, MD  omeprazole (PRILOSEC) 20 MG capsule Take 20 mg by mouth daily as needed (acid reflux, heartburn).  09/10/14   Historical Provider, MD  PROAIR HFA 108 (90 BASE) MCG/ACT inhaler Inhale 1-2 puffs into the lungs every 6 (six) hours as needed for wheezing or shortness of breath.  09/10/14   Historical Provider, MD  RA VITAMIN D-3 1000 UNITS tablet Take 1,000 Units by mouth daily. 09/10/14   Historical Provider, MD   BP 112/78 mmHg  Pulse 77  Temp(Src) 98 F (36.7 C) (Oral)  Resp 16  SpO2 100%  LMP  02/22/2012 Physical Exam  Constitutional: She is oriented to person, place, and time. She appears well-developed and well-nourished.  Alert and in no acute distress  HENT:  Head: Normocephalic and atraumatic.  Cardiovascular: Normal rate, regular rhythm, normal heart sounds and intact distal pulses.  Exam reveals no gallop and no friction rub.   No murmur heard. Pulmonary/Chest: Effort normal and breath sounds normal. No respiratory distress. She has no wheezes. She has no rales. She exhibits tenderness.  Abdominal: She exhibits no mass. There is no rebound and no guarding.  Abdomen soft, non-tender, non-distended Bowel sounds positive in all four quadrants   Musculoskeletal:       Legs: Tenderness to palpation as depicted in image, worst posterior knee.  5/5 muscle strength bilateral LE  Neurological: She is alert and oriented to person, place, and time.  Alert, oriented, thought content appropriate. Speech is clear and goal oriented, able to follow commands. History of events leading to ER visit change throughout exam.   Cranial Nerves:  II:  Pupils equal, round, reactive to light III, IV, VI: EOM intact bilaterally, ptosis not present V,VII: smile symmetric, eyes kept closed tightly against resistance, facial light touch sensation equal VIII: hearing grossly normal IX, X: symmetric soft palate movement, uvula elevates symmetrically  XI: bilateral shoulder shrug symmetric and strong XII: midline tongue extension  5/5 muscle strength in upper and lower extremities bilaterally including strong and equal grip strength and dorsiflexion/plantar flexion Sensory to light touch normal in all four extremities.  Normal finger-to-nose and rapid alternating movements;    Skin: Skin is warm and dry. No rash noted.  Psychiatric:  Mood/affect depressed.  Pt. Is worried she is dying or will be paralyzed.   Nursing note and vitals reviewed.   ED Course  Procedures (including critical care  time) Labs Review Labs Reviewed  CBC - Abnormal; Notable for the following:    Hemoglobin 11.9 (*)    All other components within normal limits  BASIC METABOLIC PANEL  D-DIMER, QUANTIT[facility]IVE ([facility])  Rosezena SensorI-STAT TROPOININ, ED    Imaging Review Dg Chest 2 View  12/27/2014  CLINICAL DATA:  Stabbing pains diffuse about the left side of the body from the face into the chest and left leg for approximately 1 week, worsening. Initial encounter. EXAM: CHEST  2 VIEW COMPARISON:  PA and lateral chest 08/12/2014 and 03/06/2012. FINDINGS: The lungs are clear. Heart size is normal. There is no pneumothorax or pleural effusion. Thoracolumbar scoliosis is unchanged. IMPRESSION: No acute disease. Electronically Signed   By: Drusilla Kannerhomas  Dalessio M.D.   On: 12/27/2014 16:39   I have personally reviewed and evaluated these images and lab results as part of my medical decision-making.   EKG Interpretation None      MDM   Final diagnoses:  Chest wall pain  Pain of left lower extremity   Tor NettersLinda N Casseus presents with atypical  chest pain which radiates down entire leg and up to head.  Significant tenderness behind left knee & proximal calf; will d-dimer to rule out DVT D-dimer negative.   Labs: BMP wdl, CBC reassuring Unlikely cardiac etiology - Negative troponin, EKG reassuring  CXR shows no acute cardiopulm dz  Patient re-evaluated and states pain has improved.  Will discharge to home with short course of pain meds to get to PCP follow up Monday. Will stress importance of follow-up.   I explained the diagnosis and have given precautions as to when/if to return to the ER including for any other new or worsening symptoms. The patient understands and accepts the medical plan as it's been dictated and I have answered their questions. Discharge instructions concerning home care and prescriptions have been given. The patient is stable and is discharged to home in good condition.  Filed Vitals:   12/27/14  1745  BP: 112/78  Pulse: 77  Temp:   Resp: [address], PA-C 12/27/14 1925  Gerhard Munch, MD 12/27/14 2318

## 2015-03-02 ENCOUNTER — Emergency Department (HOSPITAL_COMMUNITY)
Admission: EM | Admit: 2015-03-02 | Discharge: 2015-03-02 | Discharge status: Against Medical Advice | Payer: Medicaid Other | Attending: Emergency Medicine | Admitting: Emergency Medicine

## 2015-03-02 ENCOUNTER — Emergency Department (HOSPITAL_COMMUNITY): Payer: Medicaid Other

## 2015-03-02 ENCOUNTER — Encounter (HOSPITAL_COMMUNITY): Payer: Self-pay | Admitting: Emergency Medicine

## 2015-03-02 DIAGNOSIS — I10 Essential (primary) hypertension: Secondary | ICD-10-CM | POA: Diagnosis not present

## 2015-03-02 DIAGNOSIS — F17210 Nicotine dependence, cigarettes, uncomplicated: Secondary | ICD-10-CM | POA: Diagnosis not present

## 2015-03-02 DIAGNOSIS — M79622 Pain in left upper arm: Secondary | ICD-10-CM | POA: Insufficient documentation

## 2015-03-02 DIAGNOSIS — R079 Chest pain, unspecified: Secondary | ICD-10-CM | POA: Diagnosis not present

## 2015-03-02 LAB — BASIC METABOLIC PANEL
Anion gap: 15 (ref 5–15)
BUN: 12 mg/dL (ref 6–20)
CHLORIDE: 102 mmol/L (ref 101–111)
CO2: 22 mmol/L (ref 22–32)
CREATININE: 0.81 mg/dL (ref 0.44–1.00)
Calcium: 9.9 mg/dL (ref 8.9–10.3)
GFR calc non Af Amer: >60 mL/min (ref >60)
Glucose, Bld: 103 mg/dL — ABNORMAL HIGH (ref 65–99)
POTASSIUM: 4.4 mmol/L (ref 3.5–5.1)
Sodium: 139 mmol/L (ref 135–145)

## 2015-03-02 LAB — CBC
HEMATOCRIT: 36.5 % (ref 36.0–46.0)
HEMOGLOBIN: 12.1 g/dL (ref 12.0–15.0)
MCH: 28.2 pg (ref 26.0–34.0)
MCHC: 33.2 g/dL (ref 30.0–36.0)
MCV: 85.1 fL (ref 78.0–100.0)
PLATELETS: 465 10*3/uL — AB (ref 150–400)
RBC: 4.29 MIL/uL (ref 3.87–5.11)
RDW: 13.5 % (ref 11.5–15.5)
WBC: 5 10*3/uL (ref 4.0–10.5)

## 2015-03-02 LAB — I-STAT TROPONIN, ED: Troponin i, poc: 0 ng/mL (ref 0.00–0.08)

## 2015-03-02 NOTE — ED Notes (Signed)
Pt c/o left arm, SOB, and chest pain x several weeks, seen in ED for similar recently, denies any changes.

## 2015-03-02 NOTE — ED Notes (Signed)
Called to take to room  No response noted from lobby 

## 2015-03-02 NOTE — ED Notes (Signed)
Called for a third time, no answer

## 2015-03-02 NOTE — ED Notes (Signed)
Called second time  No response from lobby

## 2015-06-03 ENCOUNTER — Ambulatory Visit (HOSPITAL_COMMUNITY)
Admission: EM | Admit: 2015-06-03 | Discharge: 2015-06-03 | Disposition: A | Discharge status: Home / Self Care | Payer: Medicaid Other | Attending: Family Medicine | Admitting: Family Medicine

## 2015-06-03 ENCOUNTER — Encounter (HOSPITAL_COMMUNITY): Payer: Self-pay | Admitting: Emergency Medicine

## 2015-06-03 DIAGNOSIS — B028 Zoster with other complications: Secondary | ICD-10-CM

## 2015-06-03 DIAGNOSIS — L308 Other specified dermatitis: Principal | ICD-10-CM

## 2015-06-03 MED ORDER — VALACYCLOVIR HCL 1 G PO TABS: 1000.0000 mg | ORAL_TABLET | Freq: Three times a day (TID) | ORAL | Status: DC

## 2015-06-03 MED ORDER — CAPSAICIN IN LIDOCAINE VEHICLE 0.25 % EX CREA: 1.0000 "application " | TOPICAL_CREAM | Freq: Three times a day (TID) | CUTANEOUS | Status: DC

## 2015-06-03 NOTE — ED Provider Notes (Signed)
CSN: [id number]     Arrival date & time 06/03/15  1255 History   First MD Initiated Contact with Patient 06/03/15 1320     Chief Complaint  Patient presents with  . Rash  . Pain   (Consider location/radiation/quality/duration/timing/severity/associated sxs/prior Treatment) Patient is a 55 y.o. female presenting with rash. The history is provided by the patient.  Rash Location:  Torso Torso rash location:  L chest Quality: blistering, itchiness, painful, redness and scaling   Pain details:    Quality:  Itching and burning   Severity:  Mild   Duration:  5 days Severity:  Moderate Progression:  Spreading Chronicity:  New Relieved by:  None tried Worsened by:  Nothing tried Ineffective treatments:  None tried   Past Medical History  Diagnosis Date  . Depression   . S/P colonoscopic polypectomy 2013  . Chronic constipation   . Wears glasses   . Hypertension   . Hypercholesteremia    Past Surgical History  Procedure Laterality Date  . Orbital fracture surgery  2001    rt eye-fx-plate   . Tonsillectomy    . Colonoscopy      x2  . Left heart catheterization with coronary angiogram N/A 02/18/2014    Procedure: LEFT HEART CATHETERIZATION WITH CORONARY ANGIOGRAM;  Surgeon: Robynn Pane, MD;  Location: [facility] CATH LAB;  Service: Cardiovascular;  Laterality: N/A;   No family history on file. Social History  Substance Use Topics  . Smoking status: Current Some Day Smoker    Types: Cigarettes  . Smokeless tobacco: Not on file  . Alcohol Use: Yes     Comment: beer-   OB History    No data available     Review of Systems  Constitutional: Negative.   Skin: Positive for rash.  All other systems reviewed and are negative.   Allergies  Latex  Home Medications   Prior to Admission medications   Medication Sig Start Date End Date Taking? Authorizing Provider  acetaminophen (TYLENOL) 500 MG tablet Take 1,000 mg by mouth daily as needed for moderate pain.     Historical  Provider, MD  aspirin EC 81 MG tablet Take 81 mg by mouth daily.    Historical Provider, MD  diphenoxylate-atropine (LOMOTIL) 2.5-0.025 MG per tablet Take 1 tablet by mouth daily as needed for diarrhea or loose stools.     Historical Provider, MD  gemfibrozil (LOPID) 600 MG tablet Take 600 mg by mouth 2 (two) times daily. 01/15/14   Historical Provider, MD  HYDROcodone-acetaminophen (NORCO/VICODIN) 5-325 MG tablet Take 1 tablet by mouth every 6 (six) hours as needed for severe pain. 12/27/14   Jaime Pilcher Ward, PA-C  meloxicam (MOBIC) 15 MG tablet Take 15 mg by mouth daily as needed for pain.     Historical Provider, MD  metoprolol succinate (TOPROL-XL) 25 MG 24 hr tablet Take 12.5 mg by mouth daily.     Historical Provider, MD  NITROSTAT 0.4 MG SL tablet Place 0.4 mg under the tongue every 5 (five) minutes x 3 doses as needed for chest pain.  02/13/14   Historical Provider, MD  omeprazole (PRILOSEC) 20 MG capsule Take 20 mg by mouth daily as needed (acid reflux, heartburn).  09/10/14   Historical Provider, MD  PROAIR HFA 108 (90 BASE) MCG/ACT inhaler Inhale 1-2 puffs into the lungs every 6 (six) hours as needed for wheezing or shortness of breath.  09/10/14   Historical Provider, MD  RA VITAMIN D-3 1000 UNITS tablet Take 1,000 Units  by mouth daily. 09/10/14   Historical Provider, MD   Meds Ordered and Administered this Visit  Medications - No data to display  BP 130/84 mmHg  Pulse 75  Temp(Src) 98.1 F (36.7 C) (Oral)  Resp 18  SpO2 98%  LMP 02/22/2012 No data found.   Physical Exam  Constitutional: She appears well-developed and well-nourished.  Skin: Skin is warm and dry. Rash noted. There is erythema.  Grouped patchy vesicular erythematous rash to left t10 dermatome.  Nursing note and vitals reviewed.   ED Course  Procedures (including critical care time)  Labs Review Labs Reviewed - No data to display  Imaging Review No results found.   Visual Acuity Review  Right Eye  Distance:   Left Eye Distance:   Bilateral Distance:    Right Eye Near:   Left Eye Near:    Bilateral Near:         MDM  No diagnosis found. Meds ordered this encounter  Medications  . valACYclovir (VALTREX) 1000 MG tablet    Sig: Take 1 tablet (1,000 mg total) by mouth 3 (three) times daily.    Dispense:  21 tablet    Refill:  0  . Capsaicin in Lidocaine Vehicle 0.25 % CREA    Sig: Apply 1 application topically 3 (three) times daily.    Dispense:  60 g    Refill:  0       Linna HoffJames D [place], MD 06/03/15 [phone]

## 2015-06-03 NOTE — ED Notes (Signed)
At bedside for physician examination of patient

## 2015-06-03 NOTE — ED Notes (Signed)
Patient reports 5 day history of rash and pain to left torso/flank.

## 2015-06-05 ENCOUNTER — Telehealth (HOSPITAL_COMMUNITY): Payer: Self-pay | Admitting: Emergency Medicine

## 2015-06-18 ENCOUNTER — Telehealth (HOSPITAL_COMMUNITY): Payer: Self-pay | Admitting: Emergency Medicine

## 2015-06-18 NOTE — ED Notes (Signed)
Reports she would like a refill on her Valtrex... Reports lesions have subsided but pain is still peristent... Per Dr. Artis FlockKindl... Pain will persist and there is no need for further refills... Pt has cream (lidocaine) given at visit... Continue to use... Pt verb understanding.

## 2015-09-24 ENCOUNTER — Other Ambulatory Visit: Payer: Self-pay | Admitting: Internal Medicine

## 2015-09-24 DIAGNOSIS — Z1231 Encounter for screening mammogram for malignant neoplasm of breast: Secondary | ICD-10-CM

## 2015-10-02 ENCOUNTER — Ambulatory Visit: Payer: Medicaid Other

## 2015-11-24 ENCOUNTER — Ambulatory Visit (INDEPENDENT_AMBULATORY_CARE_PROVIDER_SITE_OTHER): Payer: Medicaid Other | Admitting: Orthopaedic Surgery

## 2015-11-24 DIAGNOSIS — M25511 Pain in right shoulder: Secondary | ICD-10-CM

## 2015-11-24 DIAGNOSIS — M25551 Pain in right hip: Secondary | ICD-10-CM

## 2015-11-24 DIAGNOSIS — M25571 Pain in right ankle and joints of right foot: Secondary | ICD-10-CM | POA: Diagnosis not present

## 2015-11-24 DIAGNOSIS — M545 Low back pain: Secondary | ICD-10-CM | POA: Diagnosis not present

## 2016-01-18 ENCOUNTER — Ambulatory Visit: Payer: Medicaid Other

## 2016-01-19 ENCOUNTER — Encounter (HOSPITAL_COMMUNITY): Payer: Self-pay | Admitting: Emergency Medicine

## 2016-01-19 ENCOUNTER — Other Ambulatory Visit: Payer: Self-pay

## 2016-01-19 ENCOUNTER — Ambulatory Visit
Admission: RE | Admit: 2016-01-19 | Discharge: 2016-01-19 | Disposition: A | Discharge status: Home / Self Care | Payer: Medicaid Other | Source: Ambulatory Visit | Attending: Internal Medicine | Admitting: Internal Medicine

## 2016-01-19 ENCOUNTER — Emergency Department (HOSPITAL_COMMUNITY)
Admission: EM | Admit: 2016-01-19 | Discharge: 2016-01-19 | Disposition: A | Discharge status: Home / Self Care | Payer: Medicaid Other | Attending: Emergency Medicine | Admitting: Emergency Medicine

## 2016-01-19 DIAGNOSIS — I10 Essential (primary) hypertension: Secondary | ICD-10-CM | POA: Insufficient documentation

## 2016-01-19 DIAGNOSIS — Z7982 Long term (current) use of aspirin: Secondary | ICD-10-CM | POA: Diagnosis not present

## 2016-01-19 DIAGNOSIS — R079 Chest pain, unspecified: Secondary | ICD-10-CM | POA: Diagnosis present

## 2016-01-19 DIAGNOSIS — Z5321 Procedure and treatment not carried out due to patient leaving prior to being seen by health care provider: Secondary | ICD-10-CM | POA: Diagnosis not present

## 2016-01-19 DIAGNOSIS — F17210 Nicotine dependence, cigarettes, uncomplicated: Secondary | ICD-10-CM | POA: Diagnosis not present

## 2016-01-19 DIAGNOSIS — Z1231 Encounter for screening mammogram for malignant neoplasm of breast: Secondary | ICD-10-CM

## 2016-01-19 LAB — BASIC METABOLIC PANEL
Anion gap: 6 (ref 5–15)
BUN: 12 mg/dL (ref 6–20)
CHLORIDE: 104 mmol/L (ref 101–111)
CO2: 27 mmol/L (ref 22–32)
CREATININE: 0.62 mg/dL (ref 0.44–1.00)
Calcium: 9.4 mg/dL (ref 8.9–10.3)
GFR calc non Af Amer: >60 mL/min (ref >60)
GLUCOSE: 90 mg/dL (ref 65–99)
Potassium: 4.7 mmol/L (ref 3.5–5.1)
Sodium: 137 mmol/L (ref 135–145)

## 2016-01-19 LAB — CBC
HCT: 37.5 % (ref 36.0–46.0)
HEMOGLOBIN: 12.5 g/dL (ref 12.0–15.0)
MCH: 27.7 pg (ref 26.0–34.0)
MCHC: 33.3 g/dL (ref 30.0–36.0)
MCV: 83 fL (ref 78.0–100.0)
PLATELETS: 272 10*3/uL (ref 150–400)
RBC: 4.52 MIL/uL (ref 3.87–5.11)
RDW: 13.4 % (ref 11.5–15.5)
WBC: 4.9 10*3/uL (ref 4.0–10.5)

## 2016-01-19 LAB — I-STAT TROPONIN, ED: Troponin i, poc: 0 ng/mL (ref 0.00–0.08)

## 2016-01-19 NOTE — ED Notes (Signed)
Pt came up to desk and states she will be leaving. This RN encouraged pt to stay. Pt states multiple dr visits today already and not wanting to stay any more.

## 2016-01-19 NOTE — ED Triage Notes (Signed)
Pt was at MD office and started having CP that goes into left arm. Denies SOB. Denies N/V.

## 2016-01-24 ENCOUNTER — Emergency Department (HOSPITAL_COMMUNITY): Payer: Medicaid Other

## 2016-01-24 ENCOUNTER — Emergency Department (HOSPITAL_COMMUNITY)
Admission: EM | Admit: 2016-01-24 | Discharge: 2016-01-24 | Disposition: A | Discharge status: Home / Self Care | Payer: Medicaid Other | Attending: Emergency Medicine | Admitting: Emergency Medicine

## 2016-01-24 ENCOUNTER — Encounter (HOSPITAL_COMMUNITY): Payer: Self-pay | Admitting: *Deleted

## 2016-01-24 DIAGNOSIS — Z91040 Latex allergy status: Secondary | ICD-10-CM | POA: Diagnosis not present

## 2016-01-24 DIAGNOSIS — Z7982 Long term (current) use of aspirin: Secondary | ICD-10-CM | POA: Diagnosis not present

## 2016-01-24 DIAGNOSIS — Y999 Unspecified external cause status: Secondary | ICD-10-CM | POA: Diagnosis not present

## 2016-01-24 DIAGNOSIS — S0993XA Unspecified injury of face, initial encounter: Secondary | ICD-10-CM | POA: Diagnosis present

## 2016-01-24 DIAGNOSIS — S0083XA Contusion of other part of head, initial encounter: Secondary | ICD-10-CM | POA: Diagnosis not present

## 2016-01-24 DIAGNOSIS — W19XXXA Unspecified fall, initial encounter: Secondary | ICD-10-CM

## 2016-01-24 DIAGNOSIS — M25512 Pain in left shoulder: Secondary | ICD-10-CM | POA: Insufficient documentation

## 2016-01-24 DIAGNOSIS — W009XXA Unspecified fall due to ice and snow, initial encounter: Secondary | ICD-10-CM | POA: Insufficient documentation

## 2016-01-24 DIAGNOSIS — Y9389 Activity, other specified: Secondary | ICD-10-CM | POA: Insufficient documentation

## 2016-01-24 DIAGNOSIS — F17210 Nicotine dependence, cigarettes, uncomplicated: Secondary | ICD-10-CM | POA: Insufficient documentation

## 2016-01-24 DIAGNOSIS — Y9289 Other specified places as the place of occurrence of the external cause: Secondary | ICD-10-CM | POA: Insufficient documentation

## 2016-01-24 DIAGNOSIS — I10 Essential (primary) hypertension: Secondary | ICD-10-CM | POA: Diagnosis not present

## 2016-01-24 MED ORDER — OXYCODONE-ACETAMINOPHEN 5-325 MG PO TABS
1.0000 | ORAL_TABLET | Freq: Once | ORAL | Status: AC
  Administered 2016-01-24: 1 via ORAL
  Filled 2016-01-24: qty 1

## 2016-01-24 MED ORDER — DICLOFENAC SODIUM 50 MG PO TBEC: 50.0000 mg | DELAYED_RELEASE_TABLET | Freq: Two times a day (BID) | ORAL | 0 refills | Status: DC

## 2016-01-24 MED ORDER — KETOROLAC TROMETHAMINE 60 MG/2ML IM SOLN
30.0000 mg | Freq: Once | INTRAMUSCULAR | Status: AC
  Administered 2016-01-24: 30 mg via INTRAMUSCULAR
  Filled 2016-01-24: qty 2

## 2016-01-24 NOTE — ED Triage Notes (Signed)
Pt reports falling on the ice and "felt something crack" to left shoulder.

## 2016-01-24 NOTE — ED Notes (Signed)
Patient transported to X-ray 

## 2016-01-24 NOTE — ED Provider Notes (Signed)
[facility]-EMERGENCY DEPT Provider Note   CSN: [id number] Arrival date & time: 01/24/16  1720  .By signing my name below, I, Betty Avery, attest that this documentation has been prepared under the direction and in the presence of [name], NP Electronically Signed: Valentino SaxonBianca Avery, ED Scribe. 01/24/16. 5:52 PM.  History   Chief Complaint Chief Complaint  Patient presents with  . Fall  . Shoulder Pain   The history is provided by the patient. No language interpreter was used.  Shoulder Pain     HPI Comments: Tor NettersLinda N Avery is a 55 y.o. female who presents to the Emergency Department complaining of sudden onset, upper left shoulder pain s/p fall that occurred an hour ago. Pt states she was at home when she slipped and striked her head, face and left shoulder on the ground. She reports head injury but denies LOC. Pt notes having dizziness after "banging" her head on the ground. Pt states she felt as if something "cracked" in her left shoulder. Pt reports associated facial swelling, shoulder swelling, neck pain. Pt notes her face suffered imapct when hitting the ground and notes she injured the left side of her jaw and is concerned if something is cracked. No modifying factors noted. Pt denies any additional injuries. She denies back pain.   Past Medical History:  Diagnosis Date  . Chronic constipation   . Depression   . Hypercholesteremia   . Hypertension   . S/P colonoscopic polypectomy 2013  . Wears glasses     There are no active problems to display for this patient.   Past Surgical History:  Procedure Laterality Date  . COLONOSCOPY     x2  . LEFT HEART CATHETERIZATION WITH CORONARY ANGIOGRAM N/A 02/18/2014   Procedure: LEFT HEART CATHETERIZATION WITH CORONARY ANGIOGRAM;  Surgeon: Robynn PaneMohan N Harwani, MD;  Location: [facility] CATH LAB;  Service: Cardiovascular;  Laterality: N/A;  . ORBITAL FRACTURE SURGERY  2001   rt eye-fx-plate   . TONSILLECTOMY      OB History    No data  available       Home Medications    Prior to Admission medications   Medication Sig Start Date End Date Taking? Authorizing Provider  acetaminophen (TYLENOL) 500 MG tablet Take 1,000 mg by mouth daily as needed for moderate pain.     Historical Provider, MD  aspirin EC 81 MG tablet Take 81 mg by mouth daily.    Historical Provider, MD  Capsaicin in Lidocaine Vehicle 0.25 % CREA Apply 1 application topically 3 (three) times daily. 06/03/15   Linna HoffJames D Kindl, MD  diclofenac (VOLTAREN) 50 MG EC tablet Take 1 tablet (50 mg total) by mouth 2 (two) times daily. 01/24/16   [name], NP  diphenoxylate-atropine (LOMOTIL) 2.5-0.025 MG per tablet Take 1 tablet by mouth daily as needed for diarrhea or loose stools.     Historical Provider, MD  gemfibrozil (LOPID) 600 MG tablet Take 600 mg by mouth 2 (two) times daily. 01/15/14   Historical Provider, MD  HYDROcodone-acetaminophen (NORCO/VICODIN) 5-325 MG tablet Take 1 tablet by mouth every 6 (six) hours as needed for severe pain. 12/27/14   [name], PA-C  metoprolol succinate (TOPROL-XL) 25 MG 24 hr tablet Take 12.5 mg by mouth daily.     Historical Provider, MD  NITROSTAT 0.4 MG SL tablet Place 0.4 mg under the tongue every 5 (five) minutes x 3 doses as needed for chest pain.  02/13/14   Historical Provider, MD  omeprazole (PRILOSEC)  20 MG capsule Take 20 mg by mouth daily as needed (acid reflux, heartburn).  09/10/14   Historical Provider, MD  PROAIR HFA 108 (90 BASE) MCG/ACT inhaler Inhale 1-2 puffs into the lungs every 6 (six) hours as needed for wheezing or shortness of breath.  09/10/14   Historical Provider, MD  RA VITAMIN D-3 1000 UNITS tablet Take 1,000 Units by mouth daily. 09/10/14   Historical Provider, MD  valACYclovir (VALTREX) 1000 MG tablet Take 1 tablet (1,000 mg total) by mouth 3 (three) times daily. 06/03/15   [name], MD    Family History History reviewed. No pertinent family history.  Social History Social History    Substance Use Topics  . Smoking status: Current Some Day Smoker    Types: Cigarettes  . Smokeless tobacco: Never Used  . Alcohol use Yes     Comment: beer-     Allergies   Latex   Review of Systems Review of Systems  HENT: Positive for facial swelling.   Musculoskeletal: Positive for arthralgias, joint swelling and neck pain. Negative for back pain. Myalgias: left upper shoulder.  Neurological: Positive for dizziness (that has resolved). Negative for syncope.  All other systems reviewed and are negative.    Physical Exam Updated Vital Signs BP 102/72 (BP Location: Right Arm)   Pulse 91   Temp 97.8 F (36.6 C) (Oral)   Resp 16   LMP 09/21/2012   SpO2 100%   Physical Exam  Constitutional: She appears well-developed and well-nourished. No distress.  HENT:  Head: Normocephalic and atraumatic.  Mouth/Throat: Oropharynx is clear and moist. No oropharyngeal exudate.  Eyes: EOM are normal. Pupils are equal, round, and reactive to light. Right eye exhibits no discharge. Left eye exhibits no discharge. No scleral icterus.  Eyes are equal and reactive to light.   Neck: Neck supple. Spinous process tenderness and muscular tenderness present. No neck rigidity. No edema present. Decreased range of motion: due to pain.  Cardiovascular: Normal rate, regular rhythm and intact distal pulses.   Pulmonary/Chest: Effort normal and breath sounds normal.  Lungs are clear.   Abdominal: Soft. There is no tenderness.  Musculoskeletal:       Left shoulder: She exhibits tenderness. She exhibits normal range of motion, no crepitus, no deformity, no laceration, no spasm, normal pulse and normal strength. Swelling: mild.  2+ radial pulse. Full passive ROM of the left shoulder. Pain to anterior aspect with palpation. Equal grips.   Neurological: She is alert. Coordination normal.  Skin: Skin is warm and dry. No rash noted. No erythema.  Psychiatric: She has a normal mood and affect. Her behavior  is normal.  Nursing note and vitals reviewed.    ED Treatments / Results   DIAGNOSTIC STUDIES: Oxygen Saturation is 99% on RA, normal by my interpretation.    COORDINATION OF CARE: 5:49 PM Discussed treatment plan with pt at bedside which includes left shoulder and cervical spine Xr, CT maxillofacial Toradol, Percocet and pt agreed to plan.  Labs (all labs ordered are listed, but only abnormal results are displayed) Labs Reviewed - No data to display  Radiology Dg Cervical Spine Complete  Result Date: 01/24/2016 CLINICAL DATA:  Initial evaluation for acute trauma, fall. EXAM: CERVICAL SPINE - COMPLETE 4+ VIEW COMPARISON:  None. FINDINGS: Vertebral bodies normally aligned with preservation of the normal cervical lordosis. Vertebral body heights preserved. Normal C1-2 articulations preserved in the dens is intact. No acute fracture or subluxation. Mild to moderate degenerative spondylolysis present at  C5-6 and C6-7. No prevertebral soft tissue swelling. IMPRESSION: 1. No radiographic evidence for acute abnormality within the cervical spine. 2. Mild-to-moderate degenerative spondylolysis at C5-6 and C6-7. Electronically Signed   By: Rise MuBenjamin  McClintock M.D.   On: 01/24/2016 20:21   Dg Shoulder Left  Result Date: 01/24/2016 CLINICAL DATA:  Recent fall with left shoulder pain, initial encounter EXAM: LEFT SHOULDER - 2+ VIEW COMPARISON:  05/02/2012 FINDINGS: There is no evidence of fracture or dislocation. There is no evidence of arthropathy or other focal bone abnormality. Soft tissues are unremarkable. IMPRESSION: No acute abnormality noted. Electronically Signed   By: Alcide CleverMark  Lukens M.D.   On: 01/24/2016 18:26   Ct Maxillofacial Wo Contrast  Result Date: 01/24/2016 CLINICAL DATA:  Left-sided facial pain after falling on ice. History of surgery for orbital fracture. EXAM: CT MAXILLOFACIAL WITHOUT CONTRAST TECHNIQUE: Multidetector CT imaging of the maxillofacial structures was performed.  Multiplanar CT image reconstructions were also generated. A small metallic BB was placed on the right temple in order to reliably differentiate right from left. COMPARISON:  Head CT 10/16/2014.  Orbits CT 03/30/2005. 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 Prior internal fixation is again noted related to an old, healed right tripod fracture. Chronic mild depression of the right nasal bone is unchanged. No acute maxillofacial fracture is identified. Orbits: Negative. No traumatic or inflammatory finding. Sinuses: The paranasal sinuses and visualized mastoid air cells are clear. Soft tissues: Unremarkable. Limited intracranial: Unremarkable. IMPRESSION: No evidence of acute maxillofacial fracture. Electronically Signed   By: Sebastian AcheAllen  Grady M.D.   On: 01/24/2016 20:44    Procedures Procedures (including critical care time)  Medications Ordered in ED Medications  oxyCODONE-acetaminophen (PERCOCET/ROXICET) 5-325 MG per tablet 1 tablet (1 tablet Oral Given 01/24/16 1747)  ketorolac (TORADOL) injection 30 mg (30 mg Intramuscular Given 01/24/16 1748)     Initial Impression / Assessment and Plan / ED Course  I have reviewed the triage vital signs and the nursing notes.  Pertinent imaging results that were available during my care of the patient were reviewed by me and considered in my medical decision making (see chart for details).  Clinical Course   55 y.o. female with face, neck and left shoulder pain s/p fall stable for d/c without neuro deficits and and no acute findings on x-ray. Will treat for pain and inflammation. Patient to f/u with her PCP. Return precautions given.   Final Clinical Impressions(s) / ED Diagnoses   Final diagnoses:  Fall, initial encounter  Acute pain of left shoulder  Contusion of face, initial encounter    New Prescriptions Discharge Medication List as of 01/24/2016  8:55 PM    START taking these medications   Details  diclofenac  (VOLTAREN) 50 MG EC tablet Take 1 tablet (50 mg total) by mouth 2 (two) times daily., Starting Sun 01/24/2016, Print       I personally performed the services described in this documentation, which was scribed in my presence. The recorded information has been reviewed and is accurate.     [id number]Hope [place] Woods[place] Neese, NP 01/25/16 0127    Nelva Nayobert Beaton, MD 01/27/16 1136

## 2016-01-24 NOTE — ED Notes (Signed)
Pt stated that her whole right shoulder and arm hurts and something went "crunch" when she fell on ice. Notified Magda PaganiniAudrey Banker(RN)

## 2016-12-07 ENCOUNTER — Emergency Department (HOSPITAL_COMMUNITY)
Admission: EM | Admit: 2016-12-07 | Discharge: 2016-12-08 | Disposition: A | Discharge status: Home / Self Care | Payer: Medicaid Other | Attending: Emergency Medicine | Admitting: Emergency Medicine

## 2016-12-07 ENCOUNTER — Encounter (HOSPITAL_COMMUNITY): Payer: Self-pay | Admitting: Emergency Medicine

## 2016-12-07 ENCOUNTER — Emergency Department (HOSPITAL_COMMUNITY): Payer: Medicaid Other

## 2016-12-07 DIAGNOSIS — R0789 Other chest pain: Secondary | ICD-10-CM

## 2016-12-07 DIAGNOSIS — I10 Essential (primary) hypertension: Secondary | ICD-10-CM | POA: Diagnosis not present

## 2016-12-07 DIAGNOSIS — Z7982 Long term (current) use of aspirin: Secondary | ICD-10-CM | POA: Insufficient documentation

## 2016-12-07 DIAGNOSIS — S82832A Other fracture of upper and lower end of left fibula, initial encounter for closed fracture: Secondary | ICD-10-CM | POA: Insufficient documentation

## 2016-12-07 DIAGNOSIS — Z79899 Other long term (current) drug therapy: Secondary | ICD-10-CM | POA: Insufficient documentation

## 2016-12-07 DIAGNOSIS — F17210 Nicotine dependence, cigarettes, uncomplicated: Secondary | ICD-10-CM | POA: Insufficient documentation

## 2016-12-07 DIAGNOSIS — Y92009 Unspecified place in unspecified non-institutional (private) residence as the place of occurrence of the external cause: Secondary | ICD-10-CM | POA: Diagnosis not present

## 2016-12-07 DIAGNOSIS — W010XXA Fall on same level from slipping, tripping and stumbling without subsequent striking against object, initial encounter: Secondary | ICD-10-CM | POA: Diagnosis not present

## 2016-12-07 DIAGNOSIS — Y939 Activity, unspecified: Secondary | ICD-10-CM | POA: Diagnosis not present

## 2016-12-07 DIAGNOSIS — S82892A Other fracture of left lower leg, initial encounter for closed fracture: Secondary | ICD-10-CM

## 2016-12-07 DIAGNOSIS — Y999 Unspecified external cause status: Secondary | ICD-10-CM | POA: Insufficient documentation

## 2016-12-07 DIAGNOSIS — S99912A Unspecified injury of left ankle, initial encounter: Secondary | ICD-10-CM | POA: Diagnosis present

## 2016-12-07 LAB — CBC
HEMATOCRIT: 34.3 % — AB (ref 36.0–46.0)
HEMOGLOBIN: 11.4 g/dL — AB (ref 12.0–15.0)
MCH: 29.1 pg (ref 26.0–34.0)
MCHC: 33.2 g/dL (ref 30.0–36.0)
MCV: 87.5 fL (ref 78.0–100.0)
Platelets: 302 10*3/uL (ref 150–400)
RBC: 3.92 MIL/uL (ref 3.87–5.11)
RDW: 14.4 % (ref 11.5–15.5)
WBC: 6.3 10*3/uL (ref 4.0–10.5)

## 2016-12-07 LAB — ETHANOL: Alcohol, Ethyl (B): 330 mg/dL (ref <10)

## 2016-12-07 LAB — BASIC METABOLIC PANEL
ANION GAP: 10 (ref 5–15)
BUN: 12 mg/dL (ref 6–20)
CALCIUM: 9 mg/dL (ref 8.9–10.3)
CO2: 20 mmol/L — AB (ref 22–32)
Chloride: 104 mmol/L (ref 101–111)
Creatinine, Ser: 0.47 mg/dL (ref 0.44–1.00)
GLUCOSE: 77 mg/dL (ref 65–99)
POTASSIUM: 4.2 mmol/L (ref 3.5–5.1)
Sodium: 134 mmol/L — ABNORMAL LOW (ref 135–145)

## 2016-12-07 LAB — I-STAT TROPONIN, ED: TROPONIN I, POC: 0 ng/mL (ref 0.00–0.08)

## 2016-12-07 MED ORDER — KETOROLAC TROMETHAMINE 30 MG/ML IJ SOLN
30.0000 mg | Freq: Once | INTRAMUSCULAR | Status: AC
  Administered 2016-12-07: 30 mg via INTRAVENOUS
  Filled 2016-12-07: qty 1

## 2016-12-07 MED ORDER — ACETAMINOPHEN 500 MG PO TABS
1000.0000 mg | ORAL_TABLET | Freq: Once | ORAL | Status: AC
  Administered 2016-12-07: 1000 mg via ORAL
  Filled 2016-12-07: qty 2
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
## 2016-12-07 NOTE — ED Notes (Signed)
EDP at bedside explaining results and plan of care. Pt began to yell at EDP accusing Dr. Verdie MosherLiu of calling her a drunk/alcholoic. EDP again attempted to explain to pt that based on her blood work we must wait some time before attempting to ambulate with crutches, EDP never stated pt was a "drunk"

## 2016-12-07 NOTE — Progress Notes (Signed)
Orthopedic Tech Progress Note Patient Details:  Betty Avery 06/16/1960 [id number]  Ortho Devices Type of Ortho Device: CAM walker Ortho Device/Splint Location: LLE Ortho Device/Splint Interventions: Ordered, Application   Jennye MoccasinHughes, Krista Som Craig 12/07/2016, 10:43 PM

## 2016-12-07 NOTE — Discharge Instructions (Signed)
Take ibuprofen and tylenol for pain Please follow-up with orthopedic surgery for follow-up Please use crutches and limit weight on the affected leg Return for worsening symptoms, including escalating pain,recurrent falls, or any other symptoms concerning to you.

## 2016-12-07 NOTE — ED Notes (Signed)
Attempted to assist pt on bathroom walk in introduced myself asked pt if she needed anything pt states "I know damn well you arent taking me to the restroom" RN aware
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
## 2016-12-08 NOTE — ED Notes (Signed)
Pt states understanding discharge instructions, pt stable and uses wheelchair to get out and then crutches

## 2016-12-14 ENCOUNTER — Encounter (HOSPITAL_BASED_OUTPATIENT_CLINIC_OR_DEPARTMENT_OTHER): Payer: Self-pay | Admitting: *Deleted

## 2016-12-19 NOTE — H&P (Signed)
[facility] [address]   [address] [place], [place] [id number] [phone] A Division of [facility]  RE: Betty ChambersFRAZIER, Lidwina             [phone]     DOB: 1960/11/21   12-14-2016  Reason for visit:  Follow up left ankle injury.  History of present illness:  The patient suffered this injury on Monday, October 29.  She fell and broke her ankle.  She was seen in the emergency room and was referred to me.  She has been in a walking boot.   EXAMINATION: Well-appearing female in no apparent distress. Swelling is relatively well controlled.  She is neurovascularly intact.  Tenderness over her lateral malleolus.  IMAGES: X-rays reviewed by me:   X-rays from the ER showed a Weber B ankle fracture.   ASSESSMENT & PLAN: Bimalleolar-equivalent Weber B ankle fracture.  I discussed the risks and benefits of ORIF and she would like to go forward with that.   Jewel Baizeimothy D.  Eulah PontMurphy, M.D.   Electronically verified by Jewel Baizeimothy D. Eulah PontMurphy, M.D. TDM:rc D 12-14-2016 T 12-16-2016

## 2016-12-20 ENCOUNTER — Encounter (HOSPITAL_BASED_OUTPATIENT_CLINIC_OR_DEPARTMENT_OTHER): Payer: Self-pay | Admitting: Anesthesiology

## 2016-12-20 ENCOUNTER — Ambulatory Visit (HOSPITAL_BASED_OUTPATIENT_CLINIC_OR_DEPARTMENT_OTHER): Payer: Medicaid Other | Admitting: Anesthesiology

## 2016-12-20 ENCOUNTER — Other Ambulatory Visit: Payer: Self-pay

## 2016-12-20 ENCOUNTER — Encounter (HOSPITAL_BASED_OUTPATIENT_CLINIC_OR_DEPARTMENT_OTHER)
Admission: RE | Disposition: A | Discharge status: Home / Self Care | Payer: Self-pay | Source: Ambulatory Visit | Attending: Orthopedic Surgery

## 2016-12-20 ENCOUNTER — Ambulatory Visit (HOSPITAL_BASED_OUTPATIENT_CLINIC_OR_DEPARTMENT_OTHER)
Admission: RE | Admit: 2016-12-20 | Discharge: 2016-12-20 | Disposition: A | Discharge status: Home / Self Care | Payer: Medicaid Other | Source: Ambulatory Visit | Attending: Orthopedic Surgery | Admitting: Orthopedic Surgery

## 2016-12-20 DIAGNOSIS — W19XXXA Unspecified fall, initial encounter: Secondary | ICD-10-CM | POA: Insufficient documentation

## 2016-12-20 DIAGNOSIS — Z79899 Other long term (current) drug therapy: Secondary | ICD-10-CM | POA: Diagnosis not present

## 2016-12-20 DIAGNOSIS — S82842A Displaced bimalleolar fracture of left lower leg, initial encounter for closed fracture: Secondary | ICD-10-CM | POA: Diagnosis not present

## 2016-12-20 DIAGNOSIS — Y9289 Other specified places as the place of occurrence of the external cause: Secondary | ICD-10-CM | POA: Diagnosis not present

## 2016-12-20 DIAGNOSIS — K5909 Other constipation: Secondary | ICD-10-CM | POA: Diagnosis not present

## 2016-12-20 DIAGNOSIS — I10 Essential (primary) hypertension: Secondary | ICD-10-CM | POA: Insufficient documentation

## 2016-12-20 HISTORY — PX: ORIF ANKLE FRACTURE: SHX5408

## 2016-12-20 SURGERY — OPEN REDUCTION INTERNAL FIXATION (ORIF) ANKLE FRACTURE
Anesthesia: General | Site: Ankle | Laterality: Left

## 2016-12-20 MED ORDER — MIDAZOLAM HCL 2 MG/2ML IJ SOLN
INTRAMUSCULAR | Status: AC
  Filled 2016-12-20: qty 2

## 2016-12-20 MED ORDER — DEXAMETHASONE SODIUM PHOSPHATE 4 MG/ML IJ SOLN
INTRAMUSCULAR | Status: DC | PRN
  Administered 2016-12-20: 10 mg via INTRAVENOUS

## 2016-12-20 MED ORDER — LACTATED RINGERS IV SOLN: INTRAVENOUS | Status: DC

## 2016-12-20 MED ORDER — HYDROMORPHONE HCL 1 MG/ML IJ SOLN: 0.2500 mg | INTRAMUSCULAR | Status: DC | PRN

## 2016-12-20 MED ORDER — ONDANSETRON HCL 4 MG/2ML IJ SOLN
INTRAMUSCULAR | Status: DC | PRN
  Administered 2016-12-20: 4 mg via INTRAVENOUS

## 2016-12-20 MED ORDER — ACETAMINOPHEN 500 MG PO TABS
1000.0000 mg | ORAL_TABLET | Freq: Once | ORAL | Status: AC
  Administered 2016-12-20: 1000 mg via ORAL

## 2016-12-20 MED ORDER — FENTANYL CITRATE (PF) 100 MCG/2ML IJ SOLN
INTRAMUSCULAR | Status: AC
  Filled 2016-12-20: qty 2

## 2016-12-20 MED ORDER — BUPIVACAINE-EPINEPHRINE (PF) 0.5% -1:200000 IJ SOLN
INTRAMUSCULAR | Status: DC | PRN
  Administered 2016-12-20: 30 mL via PERINEURAL

## 2016-12-20 MED ORDER — ONDANSETRON HCL 4 MG/2ML IJ SOLN: 4.0000 mg | Freq: Four times a day (QID) | INTRAMUSCULAR | Status: DC | PRN

## 2016-12-20 MED ORDER — HYDROCODONE-ACETAMINOPHEN 5-325 MG PO TABS: 1.0000 | ORAL_TABLET | Freq: Four times a day (QID) | ORAL | 0 refills | Status: DC | PRN

## 2016-12-20 MED ORDER — SCOPOLAMINE 1 MG/3DAYS TD PT72: 1.0000 | MEDICATED_PATCH | Freq: Once | TRANSDERMAL | Status: DC | PRN

## 2016-12-20 MED ORDER — PROPOFOL 10 MG/ML IV BOLUS
INTRAVENOUS | Status: DC | PRN
  Administered 2016-12-20: 150 mg via INTRAVENOUS
  Administered 2016-12-20: 20 mg via INTRAVENOUS

## 2016-12-20 MED ORDER — LIDOCAINE 2% (20 MG/ML) 5 ML SYRINGE
INTRAMUSCULAR | Status: DC | PRN
  Administered 2016-12-20: 60 mg via INTRAVENOUS

## 2016-12-20 MED ORDER — ONDANSETRON HCL 4 MG/2ML IJ SOLN
INTRAMUSCULAR | Status: AC
  Filled 2016-12-20: qty 2

## 2016-12-20 MED ORDER — MIDAZOLAM HCL 2 MG/2ML IJ SOLN
1.0000 mg | INTRAMUSCULAR | Status: DC | PRN
  Administered 2016-12-20: 2 mg via INTRAVENOUS

## 2016-12-20 MED ORDER — OXYCODONE HCL 5 MG/5ML PO SOLN: 5.0000 mg | Freq: Once | ORAL | Status: DC | PRN

## 2016-12-20 MED ORDER — ACETAMINOPHEN 500 MG PO TABS
ORAL_TABLET | ORAL | Status: AC
  Filled 2016-12-20: qty 2

## 2016-12-20 MED ORDER — CEFAZOLIN SODIUM-DEXTROSE 2-4 GM/100ML-% IV SOLN
2.0000 g | INTRAVENOUS | Status: AC
  Administered 2016-12-20: 2 g via INTRAVENOUS

## 2016-12-20 MED ORDER — DEXAMETHASONE SODIUM PHOSPHATE 10 MG/ML IJ SOLN
INTRAMUSCULAR | Status: AC
  Filled 2016-12-20: qty 1

## 2016-12-20 MED ORDER — CHLORHEXIDINE GLUCONATE 4 % EX LIQD: 60.0000 mL | Freq: Once | CUTANEOUS | Status: DC

## 2016-12-20 MED ORDER — GABAPENTIN 300 MG PO CAPS
ORAL_CAPSULE | ORAL | Status: AC
  Filled 2016-12-20: qty 1

## 2016-12-20 MED ORDER — LIDOCAINE 2% (20 MG/ML) 5 ML SYRINGE
INTRAMUSCULAR | Status: AC
  Filled 2016-12-20: qty 5

## 2016-12-20 MED ORDER — LACTATED RINGERS IV SOLN
INTRAVENOUS | Status: DC
  Administered 2016-12-20 (×2): via INTRAVENOUS

## 2016-12-20 MED ORDER — PROPOFOL 500 MG/50ML IV EMUL
INTRAVENOUS | Status: AC
  Filled 2016-12-20: qty 50

## 2016-12-20 MED ORDER — FENTANYL CITRATE (PF) 100 MCG/2ML IJ SOLN
50.0000 ug | INTRAMUSCULAR | Status: DC | PRN
  Administered 2016-12-20: 25 ug via INTRAVENOUS
  Administered 2016-12-20: 100 ug via INTRAVENOUS

## 2016-12-20 MED ORDER — LACTATED RINGERS IV SOLN: 500.0000 mL | INTRAVENOUS | Status: DC

## 2016-12-20 MED ORDER — GABAPENTIN 300 MG PO CAPS
300.0000 mg | ORAL_CAPSULE | Freq: Once | ORAL | Status: AC
  Administered 2016-12-20: 300 mg via ORAL

## 2016-12-20 MED ORDER — OXYCODONE HCL 5 MG PO TABS: 5.0000 mg | ORAL_TABLET | Freq: Once | ORAL | Status: DC | PRN

## 2016-12-20 MED ORDER — CEFAZOLIN SODIUM-DEXTROSE 2-4 GM/100ML-% IV SOLN
INTRAVENOUS | Status: AC
  Filled 2016-12-20: qty 100

## 2016-12-20 SURGICAL SUPPLY — 70 items
BANDAGE ACE 4X5 VEL STRL LF (GAUZE/BANDAGES/DRESSINGS) ×3 IMPLANT
BANDAGE ACE 6X5 VEL STRL LF (GAUZE/BANDAGES/DRESSINGS) ×3 IMPLANT
BANDAGE ESMARK 6X9 LF (GAUZE/BANDAGES/DRESSINGS) ×1 IMPLANT
BIT DRILL 2.5X125 (BIT) ×3 IMPLANT
BLADE SURG 15 STRL LF DISP TIS (BLADE) ×2 IMPLANT
BLADE SURG 15 STRL SS (BLADE) ×4
BNDG COHESIVE 4X5 TAN STRL (GAUZE/BANDAGES/DRESSINGS) ×3 IMPLANT
BNDG ESMARK 6X9 LF (GAUZE/BANDAGES/DRESSINGS) ×3
CHLORAPREP W/TINT 26ML (MISCELLANEOUS) ×3 IMPLANT
CLOSURE STERI-STRIP 1/2X4 (GAUZE/BANDAGES/DRESSINGS) ×1
CLSR STERI-STRIP ANTIMIC 1/2X4 (GAUZE/BANDAGES/DRESSINGS) ×2 IMPLANT
COVER BACK TABLE 60X90IN (DRAPES) ×3 IMPLANT
CUFF TOURNIQUET SINGLE 24IN (TOURNIQUET CUFF) IMPLANT
CUFF TOURNIQUET SINGLE 34IN LL (TOURNIQUET CUFF) ×3 IMPLANT
DECANTER SPIKE VIAL GLASS SM (MISCELLANEOUS) IMPLANT
DRAPE EXTREMITY T 121X128X90 (DRAPE) ×3 IMPLANT
DRAPE IMP U-DRAPE 54X76 (DRAPES) ×3 IMPLANT
DRAPE OEC MINIVIEW 54X84 (DRAPES) ×3 IMPLANT
DRAPE U-SHAPE 47X51 STRL (DRAPES) ×3 IMPLANT
DRSG EMULSION OIL 3X3 NADH (GAUZE/BANDAGES/DRESSINGS) ×3 IMPLANT
DRSG PAD ABDOMINAL 8X10 ST (GAUZE/BANDAGES/DRESSINGS) ×3 IMPLANT
ELECT REM PT RETURN 9FT ADLT (ELECTROSURGICAL) ×3
ELECTRODE REM PT RTRN 9FT ADLT (ELECTROSURGICAL) ×1 IMPLANT
GAUZE SPONGE 4X4 12PLY STRL (GAUZE/BANDAGES/DRESSINGS) ×3 IMPLANT
GLOVE BIO SURGEON STRL SZ7.5 (GLOVE) IMPLANT
GLOVE BIOGEL PI IND STRL 6.5 (GLOVE) ×1 IMPLANT
GLOVE BIOGEL PI IND STRL 7.0 (GLOVE) ×2 IMPLANT
GLOVE BIOGEL PI IND STRL 8 (GLOVE) ×2 IMPLANT
GLOVE BIOGEL PI INDICATOR 6.5 (GLOVE) ×2
GLOVE BIOGEL PI INDICATOR 7.0 (GLOVE) ×4
GLOVE BIOGEL PI INDICATOR 8 (GLOVE) ×4
GOWN STRL REUS W/ TWL LRG LVL3 (GOWN DISPOSABLE) ×2 IMPLANT
GOWN STRL REUS W/ TWL XL LVL3 (GOWN DISPOSABLE) ×1 IMPLANT
GOWN STRL REUS W/TWL LRG LVL3 (GOWN DISPOSABLE) ×4
GOWN STRL REUS W/TWL XL LVL3 (GOWN DISPOSABLE) ×2
NEEDLE HYPO 22GX1.5 SAFETY (NEEDLE) IMPLANT
NS IRRIG 1000ML POUR BTL (IV SOLUTION) ×3 IMPLANT
PACK BASIN DAY SURGERY FS (CUSTOM PROCEDURE TRAY) ×3 IMPLANT
PAD CAST 4YDX4 CTTN HI CHSV (CAST SUPPLIES) ×1 IMPLANT
PADDING CAST ABS 4INX4YD NS (CAST SUPPLIES) ×4
PADDING CAST ABS COTTON 4X4 ST (CAST SUPPLIES) ×2 IMPLANT
PADDING CAST COTTON 4X4 STRL (CAST SUPPLIES) ×2
PADDING CAST COTTON 6X4 STRL (CAST SUPPLIES) ×3 IMPLANT
PENCIL BUTTON HOLSTER BLD 10FT (ELECTRODE) ×3 IMPLANT
PLATE TUBULAR 1/3 5H (Plate) ×3 IMPLANT
SCREW CANC FT 16X4X2.5XHEX (Screw) ×1 IMPLANT
SCREW CANCELLOUS 4.0X14 (Screw) ×3 IMPLANT
SCREW CANCELLOUS 4.0X16MM (Screw) ×2 IMPLANT
SCREW CORTEX ST MATTA 3.5X14 (Screw) ×9 IMPLANT
SLEEVE SCD COMPRESS KNEE MED (MISCELLANEOUS) ×3 IMPLANT
SPLINT FAST PLASTER 5X30 (CAST SUPPLIES) ×40
SPLINT PLASTER CAST FAST 5X30 (CAST SUPPLIES) ×20 IMPLANT
SPONGE LAP 4X18 X RAY DECT (DISPOSABLE) ×3 IMPLANT
SUCTION FRAZIER HANDLE 10FR (MISCELLANEOUS) ×2
SUCTION TUBE FRAZIER 10FR DISP (MISCELLANEOUS) ×1 IMPLANT
SUT ETHILON 3 0 PS 1 (SUTURE) ×3 IMPLANT
SUT MNCRL AB 4-0 PS2 18 (SUTURE) IMPLANT
SUT MON AB 2-0 CT1 36 (SUTURE) IMPLANT
SUT MON AB 3-0 SH 27 (SUTURE)
SUT MON AB 3-0 SH27 (SUTURE) IMPLANT
SUT VIC AB 0 SH 27 (SUTURE) ×3 IMPLANT
SUT VIC AB 2-0 SH 27 (SUTURE)
SUT VIC AB 2-0 SH 27XBRD (SUTURE) IMPLANT
SYR BULB 3OZ (MISCELLANEOUS) ×3 IMPLANT
SYR CONTROL 10ML LL (SYRINGE) IMPLANT
TOWEL OR 17X24 6PK STRL BLUE (TOWEL DISPOSABLE) ×6 IMPLANT
TOWEL OR NON WOVEN STRL DISP B (DISPOSABLE) ×3 IMPLANT
TUBE CONNECTING 20'X1/4 (TUBING) ×1
TUBE CONNECTING 20X1/4 (TUBING) ×2 IMPLANT
UNDERPAD 30X30 (UNDERPADS AND DIAPERS) ×3 IMPLANT

## 2016-12-20 NOTE — Transfer of Care (Signed)
Immediate Anesthesia Transfer of Care Note  Patient: Betty NettersLinda N Avery  Procedure(s) Performed: OPEN REDUCTION INTERNAL FIXATION (ORIF) LEFT  ANKLE FRACTURE (Left Ankle)  Patient Location: PACU  Anesthesia Type:GA combined with regional for post-op pain  Level of Consciousness: awake, sedated and patient cooperative  Airway & Oxygen Therapy: Patient Spontanous Breathing and Patient connected to face mask oxygen  Post-op Assessment: Report given to RN and Post -op Vital signs reviewed and stable  Post vital signs: Reviewed and stable  Last Vitals:  Vitals:   12/20/16 0900 12/20/16 0915  BP: 106/67 109/69  Pulse: 62 66  Resp: 13 17  Temp:    SpO2: 100% 100%    Last Pain:  Vitals:   12/20/16 0742  TempSrc: Oral  PainSc: 9       Patients Stated Pain Goal: 5 (12/20/16 0742)  Complications: No apparent anesthesia complications

## 2016-12-20 NOTE — Anesthesia Procedure Notes (Signed)
Anesthesia Regional Block: Popliteal block   Pre-Anesthetic Checklist: ,, timeout performed, Correct Patient, Correct Site, Correct Laterality, Correct Procedure, Correct Position, site marked, Risks and benefits discussed,  Surgical consent,  Pre-op evaluation,  At surgeon's request and post-op pain management  Laterality: Left  Prep: chloraprep       Needles:  Injection technique: Single-shot  Needle Type: Echogenic Stimulator Needle          Additional Needles:   Procedures:, nerve stimulator,,,,,,,   Nerve Stimulator or Paresthesia:  Response: plantar flexion of foot, 0.45 mA,   Additional Responses:   Narrative:  Start time: 12/20/2016 8:10 AM End time: 12/20/2016 8:23 AM Injection made incrementally with aspirations every 5 mL.  Performed by: Personally  Anesthesiologist: Achille RichHodierne, Libero Puthoff, MD  Additional Notes: Functioning IV was confirmed and monitors were applied.  A 90mm 21ga Arrow echogenic stimulator needle was used. Sterile prep and drape,hand hygiene and sterile gloves were used.  Negative aspiration and negative test dose prior to incremental administration of local anesthetic. The patient tolerated the procedure well.  Ultrasound guidance: relevent anatomy identified, needle position confirmed, local anesthetic spread visualized around nerve(s), vascular puncture avoided.  Image printed for medical record.

## 2016-12-20 NOTE — Progress Notes (Signed)
Assisted Dr. Hodierne with left, ultrasound guided, popliteal block. Side rails up, monitors on throughout procedure. See vital signs in flow sheet. Tolerated Procedure well. 

## 2016-12-20 NOTE — Anesthesia Preprocedure Evaluation (Signed)
Anesthesia Evaluation  Patient identified by MRN, date of birth, ID band Patient awake    Reviewed: Allergy & Precautions, H&P , NPO status , Patient's Chart, lab work & pertinent test results  Airway Mallampati: II   Neck ROM: full    Dental   Pulmonary Current Smoker,    breath sounds clear to auscultation       Cardiovascular hypertension,  Rhythm:regular Rate:Normal     Neuro/Psych    GI/Hepatic Chronic constipation   Endo/Other    Renal/GU      Musculoskeletal   Abdominal   Peds  Hematology   Anesthesia Other Findings   Reproductive/Obstetrics                             Anesthesia Physical Anesthesia Plan  ASA: II  Anesthesia Plan: General   Post-op Pain Management:  Regional for Post-op pain   Induction:   PONV Risk Score and Plan: 2 and Ondansetron, Dexamethasone, Midazolam and Treatment may vary due to age or medical condition  Airway Management Planned: LMA  Additional Equipment:   Intra-op Plan:   Post-operative Plan:   Informed Consent: I have reviewed the patients History and Physical, chart, labs and discussed the procedure including the risks, benefits and alternatives for the proposed anesthesia with the patient or authorized representative who has indicated his/her understanding and acceptance.     Plan Discussed with: CRNA, Anesthesiologist and Surgeon  Anesthesia Plan Comments:         Anesthesia Quick Evaluation

## 2016-12-20 NOTE — Discharge Instructions (Signed)
Regional Anesthesia Blocks ? ?1. Numbness or the inability to move the "blocked" extremity may last from 3-48 hours after placement. The length of time depends on the medication injected and your individual response to the medication. If the numbness is not going away after 48 hours, call your surgeon. ? ?2. The extremity that is blocked will need to be protected until the numbness is gone and the  Strength has returned. Because you cannot feel it, you will need to take extra care to avoid injury. Because it may be weak, you may have difficulty moving it or using it. You may not know what position it is in without looking at it while the block is in effect. ? ?3. For blocks in the legs and feet, returning to weight bearing and walking needs to be done carefully. You will need to wait until the numbness is entirely gone and the strength has returned. You should be able to move your leg and foot normally before you try and bear weight or walk. You will need someone to be with you when you first try to ensure you do not fall and possibly risk injury. ? ?4. Bruising and tenderness at the needle site are common side effects and will resolve in a few days. ? ?5. Persistent numbness or new problems with movement should be communicated to the surgeon or the [facility] [phone])/ [facility] ([phone]).  ? ?Post Anesthesia Home Care Instructions ? ?Activity: ?Get plenty of rest for the remainder of the day. A responsible individual must stay with you for 24 hours following the procedure.  ?For the next 24 hours, DO NOT: ?-Drive a car ?-Operate machinery ?-Drink alcoholic beverages ?-Take any medication unless instructed by your physician ?-Make any legal decisions or sign important papers. ? ?Meals: ?Start with liquid foods such as gelatin or soup. Progress to regular foods as tolerated. Avoid greasy, spicy, heavy foods. If nausea and/or vomiting occur, drink only clear liquids until the  nausea and/or vomiting subsides. Call your physician if vomiting continues. ? ?Special Instructions/Symptoms: ?Your throat may feel dry or sore from the anesthesia or the breathing tube placed in your throat during surgery. If this causes discomfort, gargle with warm salt water. The discomfort should disappear within 24 hours. ? ?If you had a scopolamine patch placed behind your ear for the management of post- operative nausea and/or vomiting: ? ?1. The medication in the patch is effective for 72 hours, after which it should be removed.  Wrap patch in a tissue and discard in the trash. Wash hands thoroughly with soap and water. ?2. You may remove the patch earlier than 72 hours if you experience unpleasant side effects which may include dry mouth, dizziness or visual disturbances. ?3. Avoid touching the patch. Wash your hands with soap and water after contact with the patch. ?    ?

## 2016-12-20 NOTE — Progress Notes (Signed)
No discharge instructions to print.  RN called and texted both HC, PA and Eulah PontMurphy, MD for discharge instructions.  RN did not hear back from either.  Eulah PontMurphy, MD gave patient's husband verbal discharge instructions .... To keep foot elevated clean dry, non-weight bearing, and follow up appointment is scheduled already. RN gave patient and husband standard anesthesia and block instructions and reinforced discharge instructions that were given to husband verbally by Eulah PontMurphy, MD. Patient is now discharged.

## 2016-12-20 NOTE — Interval H&P Note (Signed)
History and Physical Interval Note:  12/20/2016 9:40 AM  Tor NettersLinda N Avery  has presented today for surgery, with the diagnosis of LEFT ANKLE FRACTURE  The various methods of treatment have been discussed with the patient and family. After consideration of risks, benefits and other options for treatment, the patient has consented to  Procedure(s): OPEN REDUCTION INTERNAL FIXATION (ORIF) LEFT  ANKLE FRACTURE (Left) as a surgical intervention .  The patient's history has been reviewed, patient examined, no change in status, stable for surgery.  I have reviewed the patient's chart and labs.  Questions were answered to the patient's satisfaction.     Miki Blank D

## 2016-12-20 NOTE — Anesthesia Postprocedure Evaluation (Signed)
Anesthesia Post Note  Patient: Betty NettersLinda N Avery  Procedure(s) Performed: OPEN REDUCTION INTERNAL FIXATION (ORIF) LEFT  ANKLE FRACTURE (Left Ankle)     Patient location during evaluation: PACU Anesthesia Type: General Level of consciousness: awake and alert Pain management: pain level controlled Vital Signs Assessment: post-procedure vital signs reviewed and stable Respiratory status: spontaneous breathing, nonlabored ventilation, respiratory function stable and patient connected to nasal cannula oxygen Cardiovascular status: blood pressure returned to baseline and stable Postop Assessment: no apparent nausea or vomiting Anesthetic complications: no    Last Vitals:  Vitals:   12/20/16 1130 12/20/16 1209  BP: (!) 142/83 137/80  Pulse: 72 73  Resp: 17 18  Temp:  (!) 36.4 C  SpO2: 100% 100%    Last Pain:  Vitals:   12/20/16 1209  TempSrc: Axillary  PainSc: 0-No pain                 Nylan Nevel S

## 2016-12-20 NOTE — Op Note (Signed)
12/20/2016  12:16 PM  PATIENT:  [name] Start    PRE-OPERATIVE DIAGNOSIS:  LEFT ANKLE FRACTURE  POST-OPERATIVE DIAGNOSIS:  Same  PROCEDURE:  OPEN REDUCTION INTERNAL FIXATION (ORIF) LEFT  ANKLE FRACTURE  SURGEON:  Shishir Krantz, Jewel BaizeIMOTHY D, MD  ASSISTANT: Aquilla HackerHenry Martensen, PA-C, he was present and scrubbed throughout the case, critical for completion in a timely fashion, and for retraction, instrumentation, and closure.   ANESTHESIA:   gen  PREOPERATIVE INDICATIONS:  [name] is a  56 y.o. female with a diagnosis of LEFT ANKLE FRACTURE who failed conservative measures and elected for surgical management.    The risks benefits and alternatives were discussed with the patient preoperatively including but not limited to the risks of infection, bleeding, nerve injury, cardiopulmonary complications, the need for revision surgery, among others, and the patient was willing to proceed.  OPERATIVE IMPLANTS: stryker plate  OPERATIVE FINDINGS: Unstable ankle fracture. Stable syndesmosis post op  BLOOD LOSS: min  COMPLICATIONS: none  TOURNIQUET TIME: 22min  OPERATIVE PROCEDURE:  Patient was identified in the preoperative holding area and site was marked by me He was transported to the operating theater and placed on the table in supine position taking care to pad all bony prominences. After a preincinduction time out anesthesia was induced. The left lower extremity was prepped and draped in normal sterile fashion and a pre-incision timeout was performed. [name] Valls received ancef for preoperative antibiotics.   I made a lateral incision of roughly 7 cm dissection was carried down sharply to the distal fibula and then spreading dissection was used proximally to protect the superficial peroneal nerve. I sharply incised the periosteum and took care to protect the peroneal tendons. I then debrided the fracture site and performed a reduction maneuver which was held in place with a clamp.   I  then selected a 5-hole one third tubular plate and placed in a neutralization fashion care was taken distally so as not to penetrate the joint with the cancellus screws.  I then stressed the syndesmosis and it was stable  The wound was then thoroughly irrigated and closed using a 0 Vicryl and absorbable Monocryl sutures. He was placed in a short leg splint.   POST OPERATIVE PLAN: Non-weightbearing. DVT prophylaxis will consist of ASA and mobilization

## 2016-12-20 NOTE — Anesthesia Procedure Notes (Addendum)
Procedure Name: LMA Insertion Date/Time: 12/20/2016 10:07 AM Performed by: Gar GibbonKeeton, Tabb Croghan S, CRNA Pre-anesthesia Checklist: Patient identified, Emergency Drugs available, Suction available and Patient being monitored Patient Re-evaluated:Patient Re-evaluated prior to induction Oxygen Delivery Method: Circle system utilized Preoxygenation: Pre-oxygenation with 100% oxygen Induction Type: IV induction Ventilation: Mask ventilation without difficulty LMA: LMA inserted LMA Size: 4.0 Number of attempts: 1 Airway Equipment and Method: Bite block Placement Confirmation: positive ETCO2 Tube secured with: Tape Dental Injury: Teeth and Oropharynx as per pre-operative assessment

## 2016-12-21 ENCOUNTER — Encounter (HOSPITAL_BASED_OUTPATIENT_CLINIC_OR_DEPARTMENT_OTHER): Payer: Self-pay | Admitting: Orthopedic Surgery

## 2017-02-27 ENCOUNTER — Other Ambulatory Visit: Payer: Self-pay | Admitting: Internal Medicine

## 2017-02-27 DIAGNOSIS — Z1231 Encounter for screening mammogram for malignant neoplasm of breast: Secondary | ICD-10-CM

## 2017-03-27 ENCOUNTER — Ambulatory Visit: Payer: Medicaid Other

## 2017-03-29 ENCOUNTER — Ambulatory Visit: Payer: Medicaid Other

## 2017-04-18 ENCOUNTER — Ambulatory Visit: Payer: Medicaid Other

## 2017-05-09 ENCOUNTER — Ambulatory Visit: Payer: Medicaid Other

## 2017-05-20 ENCOUNTER — Other Ambulatory Visit: Payer: Self-pay

## 2017-05-20 ENCOUNTER — Emergency Department (HOSPITAL_COMMUNITY): Payer: Medicaid Other

## 2017-05-20 ENCOUNTER — Emergency Department (HOSPITAL_COMMUNITY)
Admission: EM | Admit: 2017-05-20 | Discharge: 2017-05-20 | Disposition: A | Discharge status: Home / Self Care | Payer: Medicaid Other | Attending: Emergency Medicine | Admitting: Emergency Medicine

## 2017-05-20 ENCOUNTER — Encounter (HOSPITAL_COMMUNITY): Payer: Self-pay

## 2017-05-20 DIAGNOSIS — Z7982 Long term (current) use of aspirin: Secondary | ICD-10-CM | POA: Insufficient documentation

## 2017-05-20 DIAGNOSIS — I10 Essential (primary) hypertension: Secondary | ICD-10-CM | POA: Diagnosis not present

## 2017-05-20 DIAGNOSIS — S62524A Nondisplaced fracture of distal phalanx of right thumb, initial encounter for closed fracture: Secondary | ICD-10-CM | POA: Diagnosis not present

## 2017-05-20 DIAGNOSIS — W101XXA Fall (on)(from) sidewalk curb, initial encounter: Secondary | ICD-10-CM | POA: Insufficient documentation

## 2017-05-20 DIAGNOSIS — F17210 Nicotine dependence, cigarettes, uncomplicated: Secondary | ICD-10-CM | POA: Insufficient documentation

## 2017-05-20 DIAGNOSIS — Y9301 Activity, walking, marching and hiking: Secondary | ICD-10-CM | POA: Insufficient documentation

## 2017-05-20 DIAGNOSIS — Y999 Unspecified external cause status: Secondary | ICD-10-CM | POA: Diagnosis not present

## 2017-05-20 DIAGNOSIS — Z79899 Other long term (current) drug therapy: Secondary | ICD-10-CM | POA: Insufficient documentation

## 2017-05-20 DIAGNOSIS — Y929 Unspecified place or not applicable: Secondary | ICD-10-CM | POA: Diagnosis not present

## 2017-05-20 DIAGNOSIS — S6991XA Unspecified injury of right wrist, hand and finger(s), initial encounter: Secondary | ICD-10-CM | POA: Diagnosis present

## 2017-05-20 MED ORDER — IBUPROFEN 800 MG PO TABS: 800.0000 mg | ORAL_TABLET | Freq: Three times a day (TID) | ORAL | 0 refills | Status: DC

## 2017-05-20 MED ORDER — HYDROCODONE-ACETAMINOPHEN 5-325 MG PO TABS
1.0000 | ORAL_TABLET | Freq: Once | ORAL | Status: AC
  Administered 2017-05-20: 1 via ORAL
  Filled 2017-05-20: qty 1

## 2017-05-20 MED ORDER — HYDROCODONE-ACETAMINOPHEN 5-325 MG PO TABS: 1.0000 | ORAL_TABLET | Freq: Four times a day (QID) | ORAL | 0 refills | Status: DC | PRN

## 2017-05-20 MED ORDER — CEPHALEXIN 500 MG PO CAPS: 500.0000 mg | ORAL_CAPSULE | Freq: Three times a day (TID) | ORAL | 0 refills | Status: AC

## 2017-05-20 NOTE — Progress Notes (Signed)
Orthopedic Tech Progress Note Patient Details:  Tor NettersLinda N Proud 05/21/1960 [id number]  Ortho Devices Type of Ortho Device: Finger splint Ortho Device/Splint Interventions: Application   Post Interventions Patient Tolerated: Well Instructions Provided: Care of device   Saul FordyceJennifer C Albirda Shiel 05/20/2017, 8:32 PM

## 2017-05-20 NOTE — ED Provider Notes (Addendum)
[facility] EMERGENCY DEPARTMENT Provider Note   CSN: [id number] Arrival date & time: 05/20/17  1809     History   Chief Complaint No chief complaint on file.   HPI Betty COGGIN is a 57 y.o. female presenting for evaluation of right thumb pain.  She states she tripped over the sidewalk last night, falling on her outstretched hand.  She reports acute onset thumb pain which has begun to radiate up her arm into her back.  She has not taken anything for pain including Tylenol or ibuprofen.  She denies injury elsewhere.  Pain begins at her thumb.  She denies pain of the left side.  She denies hitting her head or loss of consciousness.  No neck pain.  No numbness or tingling.  She is not on blood thinners.  She is not immunocompromised.  HPI  Past Medical History:  Diagnosis Date  . Chronic constipation   . Hypercholesteremia   . Hypertension   . S/P colonoscopic polypectomy 2013  . Wears glasses     There are no active problems to display for this patient.   Past Surgical History:  Procedure Laterality Date  . COLONOSCOPY     x2  . LEFT HEART CATHETERIZATION WITH CORONARY ANGIOGRAM N/A 02/18/2014   Procedure: LEFT HEART CATHETERIZATION WITH CORONARY ANGIOGRAM;  Surgeon: Robynn Pane, MD;  Location: [facility] CATH LAB;  Service: Cardiovascular;  Laterality: N/A;  . ORBITAL FRACTURE SURGERY  2001   rt eye-fx-plate   . ORIF ANKLE FRACTURE Left 12/20/2016   Procedure: OPEN REDUCTION INTERNAL FIXATION (ORIF) LEFT  ANKLE FRACTURE;  Surgeon: Sheral Apley, MD;  Location: [facility];  Service: Orthopedics;  Laterality: Left;  . TONSILLECTOMY       OB History   None      Home Medications    Prior to Admission medications   Medication Sig Start Date End Date Taking? Authorizing Provider  acetaminophen (TYLENOL) 500 MG tablet Take 1,000 mg by mouth daily as needed for moderate pain.     [provider]  aspirin EC 81 MG tablet Take 81  mg by mouth daily.    [provider]  Capsaicin in Lidocaine Vehicle 0.25 % CREA Apply 1 application topically 3 (three) times daily. 06/03/15   [name], MD  cephALEXin (KEFLEX) 500 MG capsule Take 1 capsule (500 mg total) by mouth 3 (three) times daily for 7 days. 05/20/17 05/27/17  Kaisa Wofford, PA-C  diphenoxylate-atropine (LOMOTIL) 2.5-0.025 MG per tablet Take 1 tablet by mouth daily as needed for diarrhea or loose stools.     [provider]  gemfibrozil (LOPID) 600 MG tablet Take 600 mg by mouth 2 (two) times daily. 01/15/14   [provider]  HYDROcodone-acetaminophen (NORCO/VICODIN) 5-325 MG tablet Take 1 tablet by mouth every 6 (six) hours as needed for severe pain. 05/20/17   Lorean Ekstrand, PA-C  ibuprofen (ADVIL,MOTRIN) 800 MG tablet Take 1 tablet (800 mg total) by mouth 3 (three) times daily with meals. 05/20/17   Homero Hyson, PA-C  metoprolol succinate (TOPROL-XL) 25 MG 24 hr tablet Take 12.5 mg by mouth daily.     [provider]  NITROSTAT 0.4 MG SL tablet Place 0.4 mg under the tongue every 5 (five) minutes x 3 doses as needed for chest pain.  02/13/14   [provider]  omeprazole (PRILOSEC) 20 MG capsule Take 20 mg by mouth daily as needed (acid reflux, heartburn).  09/10/14  [provider]  PROAIR HFA 108 (90 BASE) MCG/ACT inhaler Inhale 1-2 puffs into the lungs every 6 (six) hours as needed for wheezing or shortness of breath.  09/10/14   [provider]  RA VITAMIN D-3 1000 UNITS tablet Take 1,000 Units by mouth daily. 09/10/14   [provider]  valACYclovir (VALTREX) 1000 MG tablet Take 1 tablet (1,000 mg total) by mouth 3 (three) times daily. 06/03/15   [name], MD    Family History No family history on file.  Social History Social History   Tobacco Use  . Smoking status: Current Some Day Smoker    Packs/day: 0.25    Types: Cigarettes  . Smokeless tobacco: Never Used  Substance  Use Topics  . Alcohol use: Yes    Alcohol/week: 8.4 oz    Types: 14 Cans of beer per week    Comment: beer- alcohol level 330 when pt fell  . Drug use: No     Allergies   Latex   Review of Systems Review of Systems  Musculoskeletal: Positive for arthralgias.  Skin: Positive for wound.  Neurological: Negative for numbness.  Hematological: Does not bruise/bleed easily.     Physical Exam Updated Vital Signs BP 130/63 (BP Location: Left Arm)   Pulse 95   Temp 98.5 F (36.9 C) (Oral)   Resp 19   Ht 5\' 1"  (1.549 m)   Wt 49.4 kg (109 lb)   LMP 09/21/2012   SpO2 99%   BMI 20.60 kg/m   Physical Exam  Constitutional: She is oriented to person, place, and time. She appears well-developed and well-nourished. No distress.  HENT:  Head: Normocephalic and atraumatic.  Eyes: Pupils are equal, round, and reactive to light. Conjunctivae and EOM are normal.  Neck: Normal range of motion.  No tenderness palpation of the C-spine  Cardiovascular: Normal rate, regular rhythm and intact distal pulses.  Pulmonary/Chest: Effort normal and breath sounds normal. No respiratory distress. She has no wheezes.  Abdominal: She exhibits no distension.  Musculoskeletal: Normal range of motion. She exhibits edema and tenderness.  Tenderness palpation of bilateral back musculature without pain over midline spine.  Full active range of motion of shoulder and elbow without pain.  Full active range of motion of the wrist without pain.  Tenderness to palpation of the distal thumb.  Laceration noted on the pad of the thumb.  No nail involvement.  Good cap refill.  Sensation intact.  No tenderness or pain of the other fingers, hand, or carpals.  Neurological: She is alert and oriented to person, place, and time. No sensory deficit.  Skin: Skin is warm. No rash noted.  Psychiatric: She has a normal mood and affect.  Nursing note and vitals reviewed.    ED Treatments / Results  Labs (all labs ordered are  listed, but only abnormal results are displayed) Labs Reviewed - No data to display  EKG None  Radiology Dg Forearm Right  Result Date: 05/20/2017 CLINICAL DATA:  Fall onto outstretched right hand, minimal movement of right hand. Pain when straightening right hand. Unable to take ring off of right hand. EXAM: RIGHT FOREARM - 2 VIEW COMPARISON:  None. FINDINGS: There is no evidence of fracture or other focal bone lesions. Soft tissues are unremarkable. IMPRESSION: Negative. Electronically Signed   By: Amie Portland M.D.   On: 05/20/2017 18:48   Dg Hand Complete Right  Result Date: 05/20/2017 CLINICAL DATA:  Fall onto outstretched right hand, minimal movement of right  hand. Pain when straightening right hand.Unable to take ring off of right hand. EXAM: RIGHT HAND - COMPLETE 3+ VIEW COMPARISON:  None. FINDINGS: Nondisplaced fracture of the distal phalanx of the thumb. Fracture seen along the midshaft with a fracture extending across the radial base. Fracture does not clearly intersect the articular surface at the IP joint. No other evidence of a fracture. Joints are normally spaced and aligned. IMPRESSION: Nondisplaced fracture of the distal phalanx of the right thumb. Electronically Signed   By: Amie Portlandavid  Ormond M.D.   On: 05/20/2017 18:48    Procedures Procedures (including critical care time)  Medications Ordered in ED Medications  HYDROcodone-acetaminophen (NORCO/VICODIN) 5-325 MG per tablet 1 tablet (1 tablet Oral Given 05/20/17 2016)     Initial Impression / Assessment and Plan / ED Course  I have reviewed the triage vital signs and the nursing notes.  Pertinent labs & imaging results that were available during my care of the patient were reviewed by me and considered in my medical decision making (see chart for details).     Patient presenting for evaluation of right thumb pain.  Physical exam shows patient is neurovascularly intact.  She has a superficial laceration of the pad of the  thumb.  X-ray reviewed and interpreted by me, shows fracture of the distal phalanx of the thumb.  No bony injury noted elsewhere.  Discussed findings with patient.  Discussed treatment with NSAIDs and pain control.  PMP checked, pt without excessive narcotic use. (Recent narcotics for ankle fx, no others.) Splint for protection.  Wound cleaned and dressed prior to splinting.  Patient to follow-up with her orthopedic doctor at her request.  At this time, patient appears safe for discharge.  Return precautions given.  Patient states she understands and agrees to plan.   Final Clinical Impressions(s) / ED Diagnoses   Final diagnoses:  Closed nondisplaced fracture of distal phalanx of right thumb, initial encounter    ED Discharge Orders        Ordered    cephALEXin (KEFLEX) 500 MG capsule  3 times daily     05/20/17 2042    HYDROcodone-acetaminophen (NORCO/VICODIN) 5-325 MG tablet  Every 6 hours PRN     05/20/17 2042    ibuprofen (ADVIL,MOTRIN) 800 MG tablet  3 times daily with meals     05/20/17 2042       Casey Maxfield, PA-C 05/20/17 2242    Alveria ApleyCaccavale, Maureena Dabbs, PA-C 05/20/17 2308    Nira Connardama, Pedro Eduardo, MD 05/21/17 1306

## 2017-05-20 NOTE — ED Triage Notes (Signed)
Patient fell last night while walking on side walk after tripping on stump. No loc, pain to right hand, thumb and forearm

## 2017-05-20 NOTE — ED Notes (Addendum)
Pt has right thumb pain/ Cap refill less than  2 seconds. Bruising on right thumb.

## 2017-05-20 NOTE — Discharge Instructions (Signed)
Take antibiotics as prescribed.  Take the entire course even if your wounds are healed. Take ibuprofen 3 times a day with meals.  Do not take other anti-inflammatories at the same time open (Advil, Motrin, naproxen, Aleve).  You may take Norco as needed for severe pain.  Have caution while taking this medicine, do not drive or operate heavy machinery. Use ice packs, 20 minutes at a time, 3 or 4 times a day for pain and swelling. Keep the splint on until evaluated by orthopedics. Follow up with your orthopedic doctor next week for further evaluation of your symptoms. Return to the emergency room if you develop numbness, fevers, inability to move your fingers, or any new or concerning symptoms.

## 2017-06-16 ENCOUNTER — Inpatient Hospital Stay (INDEPENDENT_AMBULATORY_CARE_PROVIDER_SITE_OTHER): Payer: Medicaid Other | Admitting: Physician Assistant

## 2017-07-13 ENCOUNTER — Inpatient Hospital Stay (INDEPENDENT_AMBULATORY_CARE_PROVIDER_SITE_OTHER): Payer: Medicaid Other | Admitting: Nurse Practitioner

## 2017-09-20 ENCOUNTER — Inpatient Hospital Stay (INDEPENDENT_AMBULATORY_CARE_PROVIDER_SITE_OTHER): Payer: Medicaid Other | Admitting: Physician Assistant

## 2017-10-24 ENCOUNTER — Encounter (INDEPENDENT_AMBULATORY_CARE_PROVIDER_SITE_OTHER): Payer: Self-pay | Admitting: Physician Assistant

## 2017-10-24 ENCOUNTER — Other Ambulatory Visit: Payer: Self-pay

## 2017-10-24 ENCOUNTER — Encounter

## 2017-10-24 ENCOUNTER — Ambulatory Visit (INDEPENDENT_AMBULATORY_CARE_PROVIDER_SITE_OTHER): Payer: Medicaid Other | Admitting: Physician Assistant

## 2017-10-24 VITALS — BP 115/79 | HR 74 | Temp 97.7°F | Ht 61.0 in | Wt 112.4 lb

## 2017-10-24 DIAGNOSIS — J069 Acute upper respiratory infection, unspecified: Secondary | ICD-10-CM | POA: Diagnosis not present

## 2017-10-24 DIAGNOSIS — Z1159 Encounter for screening for other viral diseases: Secondary | ICD-10-CM

## 2017-10-24 DIAGNOSIS — I10 Essential (primary) hypertension: Secondary | ICD-10-CM | POA: Diagnosis not present

## 2017-10-24 DIAGNOSIS — E7841 Elevated Lipoprotein(a): Secondary | ICD-10-CM | POA: Diagnosis not present

## 2017-10-24 DIAGNOSIS — F418 Other specified anxiety disorders: Secondary | ICD-10-CM

## 2017-10-24 DIAGNOSIS — Z114 Encounter for screening for human immunodeficiency virus [HIV]: Secondary | ICD-10-CM

## 2017-10-24 MED ORDER — PHENYLEPHRINE-DM-GG-APAP 5-10-200-325 MG/10ML PO LIQD: 20.0000 mL | ORAL | 0 refills | Status: AC

## 2017-10-24 MED ORDER — DULOXETINE HCL 30 MG PO CPEP: 30.0000 mg | ORAL_CAPSULE | Freq: Every day | ORAL | 2 refills | Status: DC

## 2017-10-24 MED ORDER — METOPROLOL SUCCINATE ER 25 MG PO TB24: 12.5000 mg | ORAL_TABLET | Freq: Every day | ORAL | 5 refills | Status: DC

## 2017-10-24 MED ORDER — ASPIRIN EC 81 MG PO TBEC: 81.0000 mg | DELAYED_RELEASE_TABLET | Freq: Every day | ORAL | 3 refills | Status: DC

## 2017-10-24 MED ORDER — NAPROXEN 500 MG PO TABS: 500.0000 mg | ORAL_TABLET | Freq: Two times a day (BID) | ORAL | 0 refills | Status: DC

## 2017-10-24 NOTE — Progress Notes (Signed)
Pt would like medication to help increase her appetite

## 2017-10-24 NOTE — Patient Instructions (Addendum)
Living With Depression Everyone experiences occasional disappointment, sadness, and loss in their lives. When you are feeling down, blue, or sad for at least 2 weeks in a row, it may mean that you have depression. Depression can affect your thoughts and feelings, relationships, daily activities, and physical health. It is caused by changes in the way your brain functions. If you receive a diagnosis of depression, your health care provider will tell you which type of depression you have and what treatment options are available to you. If you are living with depression, there are ways to help you recover from it and also ways to prevent it from coming back. How to cope with lifestyle changes Coping with stress Stress is your body's reaction to life changes and events, both good and bad. Stressful situations may include:  Getting married.  The death of a spouse.  Losing a job.  Retiring.  Having a baby.  Stress can last just a few hours or it can be ongoing. Stress can play a major role in depression, so it is important to learn both how to cope with stress and how to think about it differently. Talk with your health care provider or a counselor if you would like to learn more about stress reduction. He or she may suggest some stress reduction techniques, such as:  Music therapy. This can include creating music or listening to music. Choose music that you enjoy and that inspires you.  Mindfulness-based meditation. This kind of meditation can be done while sitting or walking. It involves being aware of your normal breaths, rather than trying to control your breathing.  Centering prayer. This is a kind of meditation that involves focusing on a spiritual word or phrase. Choose a word, phrase, or sacred image that is meaningful to you and that brings you peace.  Deep breathing. To do this, expand your stomach and inhale slowly through your nose. Hold your breath for 3-5 seconds, then exhale  slowly, allowing your stomach muscles to relax.  Muscle relaxation. This involves intentionally tensing muscles then relaxing them.  Choose a stress reduction technique that fits your lifestyle and personality. Stress reduction techniques take time and practice to develop. Set aside 5-15 minutes a day to do them. Therapists can offer training in these techniques. The training may be covered by some insurance plans. Other things you can do to manage stress include:  Keeping a stress diary. This can help you learn what triggers your stress and ways to control your response.  Understanding what your limits are and saying no to requests or events that lead to a schedule that is too full.  Thinking about how you respond to certain situations. You may not be able to control everything, but you can control how you react.  Adding humor to your life by watching funny films or TV shows.  Making time for activities that help you relax and not feeling guilty about spending your time this way.  Medicines Your health care provider may suggest certain medicines if he or she feels that they will help improve your condition. Avoid using alcohol and other substances that may prevent your medicines from working properly (may interact). It is also important to:  Talk with your pharmacist or health care provider about all the medicines that you take, their possible side effects, and what medicines are safe to take together.  Make it your goal to take part in all treatment decisions (shared decision-making). This includes giving input on the side   effects of medicines. It is best if shared decision-making with your health care provider is part of your total treatment plan.  If your health care provider prescribes a medicine, you may not notice the full benefits of it for 4-8 weeks. Most people who are treated for depression need to be on medicine for at least 6-12 months after they feel better. If you are taking  medicines as part of your treatment, do not stop taking medicines without first talking to your health care provider. You may need to have the medicine slowly decreased (tapered) over time to decrease the risk of harmful side effects. Relationships Your health care provider may suggest family therapy along with individual therapy and drug therapy. While there may not be family problems that are causing you to feel depressed, it is still important to make sure your family learns as much as they can about your mental health. Having your family's support can help make your treatment successful. How to recognize changes in your condition Everyone has a different response to treatment for depression. Recovery from major depression happens when you have not had signs of major depression for two months. This may mean that you will start to:  Have more interest in doing activities.  Feel less hopeless than you did 2 months ago.  Have more energy.  Overeat less often, or have better or improving appetite.  Have better concentration.  Your health care provider will work with you to decide the next steps in your recovery. It is also important to recognize when your condition is getting worse. Watch for these signs:  Having fatigue or low energy.  Eating too much or too little.  Sleeping too much or too little.  Feeling restless, agitated, or hopeless.  Having trouble concentrating or making decisions.  Having unexplained physical complaints.  Feeling irritable, angry, or aggressive.  Get help as soon as you or your family members notice these symptoms coming back. How to get support and help from others How to talk with friends and family members about your condition Talking to friends and family members about your condition can provide you with one way to get support and guidance. Reach out to trusted friends or family members, explain your symptoms to them, and let them know that you are  working with a health care provider to treat your depression. Financial resources Not all insurance plans cover mental health care, so it is important to check with your insurance carrier. If paying for co-pays or counseling services is a problem, search for a local or county mental health care center. They may be able to offer public mental health care services at low or no cost when you are not able to see a private health care provider. If you are taking medicine for depression, you may be able to get the generic form, which may be less expensive. Some makers of prescription medicines also offer help to patients who cannot afford the medicines they need. Follow these instructions at home:  Get the right amount and quality of sleep.  Cut down on using caffeine, tobacco, alcohol, and other potentially harmful substances.  Try to exercise, such as walking or lifting small weights.  Take over-the-counter and prescription medicines only as told by your health care provider.  Eat a healthy diet that includes plenty of vegetables, fruits, whole grains, low-fat dairy products, and lean protein. Do not eat a lot of foods that are high in solid fats, added sugars, or salt.    Keep all follow-up visits as told by your health care provider. This is important. Contact a health care provider if:  You stop taking your antidepressant medicines, and you have any of these symptoms: ? Nausea. ? Headache. ? Feeling lightheaded. ? Chills and body aches. ? Not being able to sleep (insomnia).  You or your friends and family think your depression is getting worse. Get help right away if:  You have thoughts of hurting yourself or others. If you ever feel like you may hurt yourself or others, or have thoughts about taking your own life, get help right away. You can go to your nearest emergency department or call:  Your local emergency services (911 in the [place]  A suicide crisis helpline, such as the  National Suicide Prevention Lifeline at 1-800-273-8255. This is open 24-hours a day.  Summary  If you are living with depression, there are ways to help you recover from it and also ways to prevent it from coming back.  Work with your health care team to create a management plan that includes counseling, stress management techniques, and healthy lifestyle habits. This information is not intended to replace advice given to you by your health care provider. Make sure you discuss any questions you have with your health care provider. Document Released: 01/04/2016 Document Revised: 01/04/2016 Document Reviewed: 01/04/2016 Elsevier Interactive Patient Education  2018 Elsevier Inc.   Community Resources  Advocacy/Legal Legal Aid [place]:  1-866-219-5262  /  336-272-0148  Family Justice Center:  336-641-7233  [facility] [place] 24-hr Crisis line:  336-273-7273  Women's Resource Center, [place]:  336-275-6090  Court Watch (custody):  336-275-2346  [place] [facility]:   336-279-9299    Baby & Breastfeeding Car Seat Inspection @ Various [place] Fire Depts.- call 336-373-2177  [facility] [facility]  336-832-6860  [facility] 336-878-6712  WIC: 336-641-3663 ([place]);  336-641-7571 ([place])  La Leche League:  1-877-452-5321   Childcare [place] [facility]: 336-369-5097 ([place]) / 336-887-8224 ([place])  - Child Care Resources/ Referrals/ Scholarships  - Head Start/ Early Head Start (call or apply online)  [place] DHHS: [place] Pre-K :  1-800-859-0829 / 336-274-5437   Employment / Job Search [facility] [place]: 336-275-6090 / [address]  [employer] (JobLink): 336-373-5922 ([place]) / 336-882-4141 ([place])  [facility] Community Resource/ Career Center: 336-275-9801 / 336-282-7307  [facility] Job & Career Center: 336-373-3764  DHHS Work First: 336-641-3447 ([place]) / 336-641-3447 ([place])  [facility] [place]:  336-676-5871    Financial Assistance [facility] [facility]:  336-553-2657  [employer]: 336-235-0368  [place] (furniture):  336-370-4002  [place] [facility]: 336-373-4264  Low Income Energy Assistance  336-641-3000   Food Assistance DHHS- SNAP/ Food Stamps: 336-641-4588  WIC: [place]- 336-641-3663 ;  [place] 336-641-7571  Little Green Book- Free Meals  Little Blue Book- Free Food Pantries  During the summer, text "FOOD" to 877877   General Health / Clinics (Adults) Orange Card (for Adults) through [place] [facility]: (336) 895-4900  [facility]:   336-832-8035  [facility]:   336-832-4444  Health Department:  336-641-3245  [facility]:  336-415-3877 / 336-641-2100  Planned [facility] of [place]:   336-373-0678  [facility]:   336-334-4822 x 50251   Housing [place] Housing Coalition:   336-691-9521   Housing Authority:  336-275-8501  Affordable Housing Managemnt:  336-273-0568   Immigrant/ Refugee Center for [place] (  [employer]CG):  336-256-1065  [facility] [employer] [facility]:  336-379-0037  [employer]:  336-937-4701  [employer] World Services:  336-617-0381  African Services Coalition:  336-574-2677   LGBTQ YouthSAFE  www.youthsafe[place].org  PFLAG  336-541-6754 / info@[place].org  The Trevor Project:  1-866-488-7386   Mental Health/ Substance Use [facility] [place]  336-387-6161  [facility]:  336-832-9700 or 1-800-711-2635  [facility] of Care:  336-271-5888  [facility]:  336-294-1349  [facility]:  336-542-2884  [facility] (walk-ins)  336-676-6840 / [address]  [place]:  800-449-1287  Alcoholics Anonymous:  336-854-4278  Narcotics Anonymous:  800-365-1036  Quit Smoking Hotline:  800-QUIT-NOW (800-784-8669)   Parenting Children's Home Society:  800-632-1400  [place]: Education Center & Support Groups:   336-832-6682  YWCA: 336-273-3461  [employer]: Bringing Out the Best:  336-334-3120               Thriving at Three (Hispanic families): 336-256-1066  Healthy Start ([facility] [place]):  336-387-6161 x2288  Parents as Teachers:  336-691-0024  [place] [facility]- Learning Together (Immigrants): 336-369-5001   Poison Control 800-222-1222  Sports & Recreation [employer] Open Doors Application: ymcanwnc.org/join/open-doors-financial-assistance/  City of [place] Recreation Centers: http://www.[place].gov/index.aspx?page=3615   Special Needs Family Support Network:  336-832-6507  Autism Society of [place]:   336-333-0197 x1402 or x1412 /  800-785-1035  TEACCH [place]:  336-334-5773  ARC of [place]:  336-373-1076  Children's Developmental Service Agency (CDSA):  336-334-5601  CC4C (Care Coordination for Children):  336-641-7641   Transportation Medicaid Transportation: 336-641-4848 to apply  [place] Transit Authority: 336-335-6499 (reduced-fare bus ID to Medicaid/ Medicare/ Orange Card)  SCAT Paratransit services: Eligible riders only, call 336-333-6589 for application   Tutoring/Mentoring Black Child Development Institute: 336-230-2138  Big Brothers/ Big Sisters: 336-378-9100 ([place])  336-882-4167 ([place])  ACES through child's school: 336-370-2321  [employer] Achievers: contact your local Y  SHIELD Mentor Program: 336-337-2771   

## 2017-10-24 NOTE — Progress Notes (Signed)
Subjective:  Patient ID: Betty Avery, female    DOB: [birth year]  Age: 57 y.o. MRN: [id number]  CC: chest cold   HPI Betty Avery is a 57 y.o. female with a medical history of chronic constipation, HLD, HTN, and s/p colonoscopic polypectomy presents as a new patient with concern for chest cold.  Has been coughing up "a bunch of green stuff" since one week ago. Associated with chills. Says there is malaise and body aching but thinks both malaise and body aches are chronic for over 12 months. Pt also believes she is depressed. Does not know what may be causing her depression. PHQ9 25 and GAD7 14 today. Has seen a psychiatrist one month ago. Was not prescribed anything.Taken there by daughter and pt says she does not know how to get back to psychiatrist.    *Pt very poor historian.    Outpatient Medications Prior to Visit  Medication Sig Dispense Refill  . acetaminophen (TYLENOL) 500 MG tablet Take 1,000 mg by mouth daily as needed for moderate pain.     Marland Kitchen aspirin EC 81 MG tablet Take 81 mg by mouth daily.    Marland Kitchen gemfibrozil (LOPID) 600 MG tablet Take 600 mg by mouth 2 (two) times daily.  0  . ibuprofen (ADVIL,MOTRIN) 800 MG tablet Take 1 tablet (800 mg total) by mouth 3 (three) times daily with meals. 30 tablet 0  . PROAIR HFA 108 (90 BASE) MCG/ACT inhaler Inhale 1-2 puffs into the lungs every 6 (six) hours as needed for wheezing or shortness of breath.   0  . RA VITAMIN D-3 1000 UNITS tablet Take 1,000 Units by mouth daily.  0  . metoprolol succinate (TOPROL-XL) 25 MG 24 hr tablet Take 12.5 mg by mouth daily.     Marland Kitchen NITROSTAT 0.4 MG SL tablet Place 0.4 mg under the tongue every 5 (five) minutes x 3 doses as needed for chest pain.   0  . omeprazole (PRILOSEC) 20 MG capsule Take 20 mg by mouth daily as needed (acid reflux, heartburn).   0  . Capsaicin in Lidocaine Vehicle 0.25 % CREA Apply 1 application topically 3 (three) times daily. 60 g 0  . diphenoxylate-atropine (LOMOTIL) 2.5-0.025  MG per tablet Take 1 tablet by mouth daily as needed for diarrhea or loose stools.     Marland Kitchen HYDROcodone-acetaminophen (NORCO/VICODIN) 5-325 MG tablet Take 1 tablet by mouth every 6 (six) hours as needed for severe pain. 6 tablet 0  . valACYclovir (VALTREX) 1000 MG tablet Take 1 tablet (1,000 mg total) by mouth 3 (three) times daily. 21 tablet 0   No facility-administered medications prior to visit.      ROS Review of Systems  Constitutional: Positive for malaise/fatigue. Negative for chills and fever.  HENT: Positive for congestion.   Eyes: Negative for blurred vision.  Respiratory: Positive for cough (seldom). Negative for shortness of breath.   Cardiovascular: Negative for chest pain and palpitations.  Gastrointestinal: Negative for abdominal pain and nausea.  Genitourinary: Negative for dysuria and hematuria.  Musculoskeletal: Negative for joint pain and myalgias.  Skin: Negative for rash.  Neurological: Negative for tingling and headaches.  Psychiatric/Behavioral: Positive for depression. The patient is not nervous/anxious.     Objective:  BP 115/79 (BP Location: Left Arm, Patient Position: Sitting, Cuff Size: Normal)   Pulse 74   Temp 97.7 F (36.5 C) (Oral)   Ht 5\' 1"  (1.549 m)   Wt 112 lb 6.4 oz (51 kg)   LMP 09/21/2012  SpO2 97%   BMI 21.24 kg/m   BP/Weight 10/24/2017 05/20/2017 12/20/2016  Systolic BP 115 130 137  Diastolic BP 79 63 80  Wt. (Lbs) 112.4 109 116  BMI 21.24 20.6 21.92      Physical Exam  Constitutional: She is oriented to person, place, and time.  Well developed, thin, NAD, fatigued  HENT:  Head: Normocephalic and atraumatic.  Mild postnasal drip. Turbinates moderately hypertrophic. Right TM mildly erythematous  Eyes: Conjunctivae are normal. Right eye exhibits no discharge. Left eye exhibits no discharge. No scleral icterus.  Neck: Normal range of motion. Neck supple. No thyromegaly present.  Cardiovascular: Normal rate, regular rhythm and normal  heart sounds.  Pulmonary/Chest: Effort normal and breath sounds normal. No respiratory distress. She has no wheezes. She has no rales.  Abdominal: Soft. Bowel sounds are normal. There is no tenderness.  Musculoskeletal: She exhibits no edema.  Lymphadenopathy:    She has cervical adenopathy (mild anterior lymphadenopathy).  Neurological: She is alert and oriented to person, place, and time.  Skin: Skin is warm and dry. No rash noted. No erythema. No pallor.  Psychiatric: She has a normal mood and affect. Her behavior is normal. Thought content normal.  Vitals reviewed.    Assessment & Plan:   1. Acute upper respiratory infection - Phenylephrine-DM-GG-APAP (MUCINEX FAST-MAX COLD FLU) 5-10-200-325 MG/10ML LIQD; Take 20 mLs by mouth every 4 (four) hours for 5 days.  Dispense: 1 Bottle; Refill: 0 - naproxen (NAPROSYN) 500 MG tablet; Take 1 tablet (500 mg total) by mouth 2 (two) times daily with a meal.  Dispense: 30 tablet; Refill: 0  2. Depression with anxiety - TSH - DULoxetine (CYMBALTA) 30 MG capsule; Take 1 capsule (30 mg total) by mouth daily.  Dispense: 30 capsule; Refill: 2  3. Elevated lipoprotein(a) - Lipid panel  4. Hypertension, unspecified type - CBC with Differential - Comprehensive metabolic panel - TSH - metoprolol succinate (TOPROL-XL) 25 MG 24 hr tablet; Take 0.5 tablets (12.5 mg total) by mouth daily.  Dispense: 30 tablet; Refill: 5 - aspirin EC 81 MG tablet; Take 1 tablet (81 mg total) by mouth daily.  Dispense: 90 tablet; Refill: 3  5. Need for hepatitis C screening test - Hepatitis c antibody (reflex)  6. Screening for HIV (human immunodeficiency virus) - HIV antibody   Meds ordered this encounter  Medications  . Phenylephrine-DM-GG-APAP (MUCINEX FAST-MAX COLD FLU) 5-10-200-325 MG/10ML LIQD    Sig: Take 20 mLs by mouth every 4 (four) hours for 5 days.    Dispense:  1 Bottle    Refill:  0    Order Specific Question:   Supervising Provider    Answer:    Hoy Register [[id number]]  . DULoxetine (CYMBALTA) 30 MG capsule    Sig: Take 1 capsule (30 mg total) by mouth daily.    Dispense:  30 capsule    Refill:  2    Order Specific Question:   Supervising Provider    Answer:   Hoy Register [[id number]]  . naproxen (NAPROSYN) 500 MG tablet    Sig: Take 1 tablet (500 mg total) by mouth 2 (two) times daily with a meal.    Dispense:  30 tablet    Refill:  0    Order Specific Question:   Supervising Provider    Answer:   Hoy Register [[id number]]  . metoprolol succinate (TOPROL-XL) 25 MG 24 hr tablet    Sig: Take 0.5 tablets (12.5 mg total) by mouth daily.    Dispense:  30 tablet    Refill:  5    Order Specific Question:   Supervising Provider    Answer:   Hoy Register [[id number]]  . aspirin EC 81 MG tablet    Sig: Take 1 tablet (81 mg total) by mouth daily.    Dispense:  90 tablet    Refill:  3    Order Specific Question:   Supervising Provider    Answer:   Hoy Register [[id number]]    Follow-up: Return in about 4 weeks (around 11/21/2017) for depression.   Loletta Specter PA

## 2017-10-25 LAB — COMPREHENSIVE METABOLIC PANEL
ALBUMIN: 4.9 g/dL (ref 3.5–5.5)
ALK PHOS: 113 IU/L (ref 39–117)
ALT: 48 IU/L — ABNORMAL HIGH (ref 0–32)
AST: 103 IU/L — AB (ref 0–40)
Albumin/Globulin Ratio: 1.5 (ref 1.2–2.2)
BILIRUBIN TOTAL: 0.4 mg/dL (ref 0.0–1.2)
BUN/Creatinine Ratio: 7 — ABNORMAL LOW (ref 9–23)
BUN: 4 mg/dL — ABNORMAL LOW (ref 6–24)
CHLORIDE: 99 mmol/L (ref 96–106)
CO2: 20 mmol/L (ref 20–29)
Calcium: 10.2 mg/dL (ref 8.7–10.2)
Creatinine, Ser: 0.55 mg/dL — ABNORMAL LOW (ref 0.57–1.00)
GFR calc Af Amer: 122 mL/min/{1.73_m2} (ref >59)
GFR calc non Af Amer: 106 mL/min/{1.73_m2} (ref >59)
GLUCOSE: 78 mg/dL (ref 65–99)
Globulin, Total: 3.3 g/dL (ref 1.5–4.5)
POTASSIUM: 4.8 mmol/L (ref 3.5–5.2)
SODIUM: 138 mmol/L (ref 134–144)
Total Protein: 8.2 g/dL (ref 6.0–8.5)

## 2017-10-25 LAB — CBC WITH DIFFERENTIAL/PLATELET
Basophils Absolute: 0 10*3/uL (ref 0.0–0.2)
Basos: 1 %
EOS (ABSOLUTE): 0.1 10*3/uL (ref 0.0–0.4)
Eos: 2 %
HEMOGLOBIN: 13.6 g/dL (ref 11.1–15.9)
Hematocrit: 40.3 % (ref 34.0–46.6)
Immature Grans (Abs): 0 10*3/uL (ref 0.0–0.1)
Immature Granulocytes: 0 %
LYMPHS ABS: 1.6 10*3/uL (ref 0.7–3.1)
Lymphs: 49 %
MCH: 29.8 pg (ref 26.6–33.0)
MCHC: 33.7 g/dL (ref 31.5–35.7)
MCV: 88 fL (ref 79–97)
MONOCYTES: 13 %
MONOS ABS: 0.4 10*3/uL (ref 0.1–0.9)
NEUTROS ABS: 1.2 10*3/uL — AB (ref 1.4–7.0)
Neutrophils: 35 %
Platelets: 276 10*3/uL (ref 150–450)
RBC: 4.56 x10E6/uL (ref 3.77–5.28)
RDW: 13.4 % (ref 12.3–15.4)
WBC: 3.3 10*3/uL — AB (ref 3.4–10.8)

## 2017-10-25 LAB — LIPID PANEL
CHOLESTEROL TOTAL: 229 mg/dL — AB (ref 100–199)
Chol/HDL Ratio: 1.5 ratio (ref 0.0–4.4)
HDL: 155 mg/dL (ref >39)
LDL Calculated: 61 mg/dL (ref 0–99)
TRIGLYCERIDES: 63 mg/dL (ref 0–149)
VLDL Cholesterol Cal: 13 mg/dL (ref 5–40)

## 2017-10-25 LAB — HCV COMMENT:

## 2017-10-25 LAB — TSH: TSH: 1.89 u[IU]/mL (ref 0.450–4.500)

## 2017-10-25 LAB — HIV ANTIBODY (ROUTINE TESTING W REFLEX): HIV Screen 4th Generation wRfx: NONREACTIVE

## 2017-10-25 LAB — HEPATITIS C ANTIBODY (REFLEX)

## 2017-10-27 ENCOUNTER — Telehealth (INDEPENDENT_AMBULATORY_CARE_PROVIDER_SITE_OTHER): Payer: Self-pay

## 2017-10-27 NOTE — Telephone Encounter (Signed)
[place] bus and [place] not hear she will call back in thirty minutes. Maryjean Mornempestt S Roberts, CMA

## 2017-10-27 NOTE — Telephone Encounter (Signed)
Patient returned call and was informed that liver inflammation is present, may be attributed to her alcohol consumption. HIV and Hepatitis C negative. Maryjean Mornempestt S Shafter Jupin, CMA

## 2017-10-27 NOTE — Telephone Encounter (Signed)
-----   Message from Roger David Gomez, PA-C sent at 10/25/2017  6:28 PM EDT ----- Liver inflammation present. May be attributed to alcohol consumption. HIV and Hep C negative. 

## 2017-10-27 NOTE — Telephone Encounter (Signed)
-----   Message from Loletta Specteroger David Gomez, PA-C sent at 10/25/2017  6:28 PM EDT ----- Liver inflammation present. May be attributed to alcohol consumption. HIV and Hep C negative.

## 2017-10-30 ENCOUNTER — Telehealth (INDEPENDENT_AMBULATORY_CARE_PROVIDER_SITE_OTHER): Payer: Self-pay

## 2017-10-30 ENCOUNTER — Telehealth (INDEPENDENT_AMBULATORY_CARE_PROVIDER_SITE_OTHER): Payer: Self-pay | Admitting: Physician Assistant

## 2017-10-30 NOTE — Telephone Encounter (Signed)
Opened in error

## 2017-10-30 NOTE — Telephone Encounter (Signed)
Noted. I will also add that patient's voice sounded as if she was possibly inebriated.

## 2017-10-30 NOTE — Telephone Encounter (Signed)
Patient was provided results on Friday 9/13 she expressed clear understanding of results. Patient and spouse presented to clinic on 9/16 stating they had questions about results and demanded to speak with PCP. CMA diffused the situation and provided all clarity the patient needed. Patient called sometime during or after lunch and left two very "nasty vulgar" messages demanding a call back. Called patient and she was very upset stating she was told she had cirrhosis and scaring of the liver that could not be treated. CMA apologized to patient for any misunderstanding and provided results again. Patient again expressed clear understanding and this was heard by PCP as call was on speaker phone. Patient apologized for the messages and again stated she now fully understood her results. Maryjean Mornempestt S Kidada Ging, CMA

## 2017-11-21 ENCOUNTER — Encounter (INDEPENDENT_AMBULATORY_CARE_PROVIDER_SITE_OTHER): Payer: Self-pay | Admitting: Physician Assistant

## 2017-11-21 ENCOUNTER — Ambulatory Visit (INDEPENDENT_AMBULATORY_CARE_PROVIDER_SITE_OTHER): Payer: Medicaid Other | Admitting: Physician Assistant

## 2017-11-21 VITALS — BP 119/82 | HR 73 | Temp 97.5°F | Resp 14 | Ht 61.0 in | Wt 123.0 lb

## 2017-11-21 DIAGNOSIS — Z79899 Other long term (current) drug therapy: Secondary | ICD-10-CM | POA: Diagnosis not present

## 2017-11-21 DIAGNOSIS — F1020 Alcohol dependence, uncomplicated: Secondary | ICD-10-CM | POA: Diagnosis not present

## 2017-11-21 DIAGNOSIS — F418 Other specified anxiety disorders: Secondary | ICD-10-CM

## 2017-11-21 MED ORDER — NALTREXONE HCL 50 MG PO TABS: 50.0000 mg | ORAL_TABLET | Freq: Every day | ORAL | 0 refills | Status: DC

## 2017-11-21 MED ORDER — FOLIC ACID 1 MG PO TABS: 1.0000 mg | ORAL_TABLET | Freq: Every day | ORAL | 1 refills | Status: DC

## 2017-11-21 MED ORDER — LORAZEPAM 2 MG PO TABS: ORAL_TABLET | ORAL | 0 refills | Status: DC

## 2017-11-21 MED ORDER — B COMPLEX PO TABS: 1.0000 | ORAL_TABLET | Freq: Every day | ORAL | 1 refills | Status: DC

## 2017-11-21 NOTE — Patient Instructions (Signed)

## 2017-11-21 NOTE — Progress Notes (Signed)
Subjective:  Patient ID: Betty Avery, female    DOB: 01-22-1961  Age: 57 y.o. MRN: [id number]  CC: f/u HTN and depression with anxiety  HPI Betty Avery is a 57 y.o. female with a medical history of chronic constipation, HLD, HTN, and s/p colonoscopic polypectomy presents to f/u on depression. States she is feeling moderately better since taking Cymbalta. She is becoming more social and laughing more often. Says family members have noted the difference in her mood. PHQ9 25 and GAD7 14 last month. PHQ9 16 and GAD7 13 today.    In regards to alcoholism, patient has been trying to stop drinking alcohol. She stopped drinking for a week and a half. Drinks approximately 2 cans of beer a day and has been diluting them with water. Used to drink two 40 oz beers per day. Wishes to stop drinking and willing to try prescription medications.    Outpatient Medications Prior to Visit  Medication Sig Dispense Refill  . aspirin EC 81 MG tablet Take 1 tablet (81 mg total) by mouth daily. 90 tablet 3  . DULoxetine (CYMBALTA) 30 MG capsule Take 1 capsule (30 mg total) by mouth daily. 30 capsule 2  . gemfibrozil (LOPID) 600 MG tablet Take 600 mg by mouth 2 (two) times daily.  0  . metoprolol succinate (TOPROL-XL) 25 MG 24 hr tablet Take 0.5 tablets (12.5 mg total) by mouth daily. 30 tablet 5  . naproxen (NAPROSYN) 500 MG tablet Take 1 tablet (500 mg total) by mouth 2 (two) times daily with a meal. 30 tablet 0  . NITROSTAT 0.4 MG SL tablet Place 0.4 mg under the tongue every 5 (five) minutes x 3 doses as needed for chest pain.   0  . omeprazole (PRILOSEC) 20 MG capsule Take 20 mg by mouth daily as needed (acid reflux, heartburn).   0  . PROAIR HFA 108 (90 BASE) MCG/ACT inhaler Inhale 1-2 puffs into the lungs every 6 (six) hours as needed for wheezing or shortness of breath.   0  . RA VITAMIN D-3 1000 UNITS tablet Take 1,000 Units by mouth daily.  0   No facility-administered medications prior to visit.       ROS Review of Systems  Constitutional: Negative for chills, fever and malaise/fatigue.  Eyes: Negative for blurred vision.  Respiratory: Negative for shortness of breath.   Cardiovascular: Negative for chest pain and palpitations.  Gastrointestinal: Negative for abdominal pain and nausea.  Genitourinary: Negative for dysuria and hematuria.  Musculoskeletal: Negative for joint pain and myalgias.  Skin: Negative for rash.  Neurological: Negative for tingling and headaches.  Psychiatric/Behavioral: Negative for depression. The patient is not nervous/anxious.     Objective:  LMP 09/21/2012   BP/Weight 10/24/2017 05/20/2017 12/20/2016  Systolic BP 115 130 137  Diastolic BP 79 63 80  Wt. (Lbs) 112.4 109 116  BMI 21.24 20.6 21.92      Physical Exam  Constitutional: She is oriented to person, place, and time.  Well developed, well nourished, NAD, polite  HENT:  Head: Normocephalic and atraumatic.  Eyes: No scleral icterus.  Neck: Normal range of motion. Neck supple. No thyromegaly present.  Cardiovascular: Normal rate, regular rhythm and normal heart sounds.  Pulmonary/Chest: Effort normal and breath sounds normal.  Abdominal: Soft. Bowel sounds are normal. There is no tenderness.  Mild hepatomegaly, liver edges rounded and smooth  Musculoskeletal: She exhibits no edema.  Neurological: She is alert and oriented to person, place, and time.  Skin:  Skin is warm and dry. No rash noted. No erythema. No pallor.  Psychiatric: She has a normal mood and affect. Her behavior is normal. Thought content normal.  Vitals reviewed.    Assessment & Plan:   1. Depression with anxiety - Continue taking Cymbalta 30 mg daily.  - PHQ9 25 and GAD7 14 last month. PHQ9 16 and GAD7 13 today.  2. Alcoholism (HCC) - Begin folic acid (FOLVITE) 1 MG tablet; Take 1 tablet (1 mg total) by mouth daily.  Dispense: 30 tablet; Refill: 1 - Begin b complex vitamins tablet; Take 1 tablet by mouth daily.   Dispense: 30 tablet; Refill: 1 - Begin LORazepam (ATIVAN) 2 MG tablet; Take one tablet every hour only if symptoms of delerium tremens arise. Do not take more than 5 tablets in one day.  Dispense: 15 tablet; Refill: 0 - Pt educated on DTs. Advised to seek emergency care if DTs are severe.   3. High risk medication use - Drug Screen, Urine   Meds ordered this encounter  Medications  . folic acid (FOLVITE) 1 MG tablet    Sig: Take 1 tablet (1 mg total) by mouth daily.    Dispense:  30 tablet    Refill:  1    Order Specific Question:   Supervising Provider    Answer:   Hoy Register [[id number]]  . b complex vitamins tablet    Sig: Take 1 tablet by mouth daily.    Dispense:  30 tablet    Refill:  1    Any brand/generic acceptable.    Order Specific Question:   Supervising Provider    Answer:   Hoy Register [[id number]]  . LORazepam (ATIVAN) 2 MG tablet    Sig: Take one tablet every hour only if symptoms of delerium tremens arise. Do not take more than 5 tablets in one day.    Dispense:  15 tablet    Refill:  0    Order Specific Question:   Supervising Provider    Answer:   Hoy Register [[id number]]  . naltrexone (DEPADE) 50 MG tablet    Sig: Take 1 tablet (50 mg total) by mouth daily.    Dispense:  30 tablet    Refill:  0    Order Specific Question:   Supervising Provider    Answer:   Hoy Register [[id number]]    Follow-up: Return in about 4 weeks (around 12/19/2017) for alcoholism and depression.   Loletta Specter PA

## 2017-11-22 ENCOUNTER — Other Ambulatory Visit (INDEPENDENT_AMBULATORY_CARE_PROVIDER_SITE_OTHER): Payer: Self-pay | Admitting: Physician Assistant

## 2017-11-22 DIAGNOSIS — J069 Acute upper respiratory infection, unspecified: Secondary | ICD-10-CM

## 2017-11-22 LAB — DRUG SCREEN, URINE
AMPHETAMINES, URINE: NEGATIVE ng/mL
BARBITURATE SCREEN URINE: NEGATIVE ng/mL
Benzodiazepine Quant, Ur: NEGATIVE ng/mL
COCAINE (METAB.): NEGATIVE ng/mL
Cannabinoid Quant, Ur: NEGATIVE ng/mL
Opiate Quant, Ur: NEGATIVE ng/mL
PCP Quant, Ur: NEGATIVE ng/mL

## 2017-11-22 NOTE — Telephone Encounter (Signed)
FWD to PCP. Marchell Froman S Bryan Goin, CMA  

## 2017-11-23 ENCOUNTER — Telehealth (INDEPENDENT_AMBULATORY_CARE_PROVIDER_SITE_OTHER): Payer: Self-pay

## 2017-11-23 NOTE — Telephone Encounter (Signed)
Patient is aware that UDS is negative. Betty Avery, CMA

## 2017-11-23 NOTE — Telephone Encounter (Signed)
-----   Message from Loletta Specter, PA-C sent at 11/22/2017  5:14 PM EDT ----- Drug screen negative.

## 2017-12-05 ENCOUNTER — Ambulatory Visit (INDEPENDENT_AMBULATORY_CARE_PROVIDER_SITE_OTHER): Payer: Medicaid Other | Admitting: Physician Assistant

## 2017-12-05 ENCOUNTER — Other Ambulatory Visit: Payer: Self-pay

## 2017-12-05 ENCOUNTER — Encounter (INDEPENDENT_AMBULATORY_CARE_PROVIDER_SITE_OTHER): Payer: Self-pay | Admitting: Physician Assistant

## 2017-12-05 VITALS — BP 111/77 | HR 75 | Temp 97.7°F | Ht 61.0 in | Wt 124.2 lb

## 2017-12-05 DIAGNOSIS — H109 Unspecified conjunctivitis: Secondary | ICD-10-CM | POA: Diagnosis not present

## 2017-12-05 DIAGNOSIS — Z23 Encounter for immunization: Secondary | ICD-10-CM

## 2017-12-05 MED ORDER — TOBRAMYCIN 0.3 % OP SOLN: 2.0000 [drp] | OPHTHALMIC | 0 refills | Status: AC

## 2017-12-05 NOTE — Progress Notes (Signed)
Subjective:  Patient ID: Betty Avery, female    DOB: Oct 25, 1960  Age: 57 y.o. MRN: [id number]  CC: eye drainage  HPI Betty Avery a 57 y.o.femalewith a medical history of chronic constipation, HLD, HTN, and s/p colonoscopic polypectomy presents late to her appointment. Wants to address left eye redness and yellow discharge. Wakes up with yellow discharge that crusts over. Picks yellow crust out throughout the day. No close contacts with the same or displaying signs of illness. Has not taken/applied anything for relief. Pt does not endorse eye pain, visual disturbance, eye swelling, f/c/n/v, malaise, nasal congestion, cough, sneezing, rash, CP, SOB, or GI/GU sxs.      Outpatient Medications Prior to Visit  Medication Sig Dispense Refill  . aspirin EC 81 MG tablet Take 1 tablet (81 mg total) by mouth daily. 90 tablet 3  . b complex vitamins tablet Take 1 tablet by mouth daily. 30 tablet 1  . DULoxetine (CYMBALTA) 30 MG capsule Take 1 capsule (30 mg total) by mouth daily. 30 capsule 2  . folic acid (FOLVITE) 1 MG tablet Take 1 tablet (1 mg total) by mouth daily. 30 tablet 1  . gemfibrozil (LOPID) 600 MG tablet Take 600 mg by mouth 2 (two) times daily.  0  . LORazepam (ATIVAN) 2 MG tablet Take one tablet every hour only if symptoms of delerium tremens arise. Do not take more than 5 tablets in one day. 15 tablet 0  . metoprolol succinate (TOPROL-XL) 25 MG 24 hr tablet Take 0.5 tablets (12.5 mg total) by mouth daily. 30 tablet 5  . naltrexone (DEPADE) 50 MG tablet Take 1 tablet (50 mg total) by mouth daily. 30 tablet 0  . NITROSTAT 0.4 MG SL tablet Place 0.4 mg under the tongue every 5 (five) minutes x 3 doses as needed for chest pain.   0  . omeprazole (PRILOSEC) 20 MG capsule Take 20 mg by mouth daily as needed (acid reflux, heartburn).   0  . PROAIR HFA 108 (90 BASE) MCG/ACT inhaler Inhale 1-2 puffs into the lungs every 6 (six) hours as needed for wheezing or shortness of breath.    0  . RA VITAMIN D-3 1000 UNITS tablet Take 1,000 Units by mouth daily.  0  . naproxen (NAPROSYN) 500 MG tablet Take 1 tablet (500 mg total) by mouth 2 (two) times daily with a meal. 30 tablet 0   No facility-administered medications prior to visit.      ROS Review of Systems  Constitutional: Negative for chills, fever and malaise/fatigue.  Eyes: Positive for discharge and redness. Negative for blurred vision, double vision, photophobia and pain.  Respiratory: Negative for shortness of breath.   Cardiovascular: Negative for chest pain and palpitations.  Gastrointestinal: Negative for abdominal pain and nausea.  Genitourinary: Negative for dysuria and hematuria.  Musculoskeletal: Negative for joint pain and myalgias.  Skin: Negative for rash.  Neurological: Negative for tingling and headaches.  Psychiatric/Behavioral: Negative for depression. The patient is not nervous/anxious.     Objective:  BP 111/77 (BP Location: Left Arm, Patient Position: Sitting, Cuff Size: Normal)   Pulse 75   Temp 97.7 F (36.5 C) (Oral)   Ht 5\' 1"  (1.549 m)   Wt 124 lb 3.2 oz (56.3 kg)   LMP 09/21/2012   SpO2 99%   BMI 23.47 kg/m   BP/Weight 12/05/2017 11/21/2017 10/24/2017  Systolic BP 111 119 115  Diastolic BP 77 82 79  Wt. (Lbs) 124.2 123 112.4  BMI 23.47  23.24 21.24      Physical Exam  Constitutional: She is oriented to person, place, and time.  Well developed, well nourished, NAD, polite  HENT:  Head: Normocephalic and atraumatic.  Mouth/Throat: No oropharyngeal exudate.  Eyes: Pupils are equal, round, and reactive to light. EOM are normal. No scleral icterus.  No discharge or erythema of the eye bilaterally. Left eye without periorbital edema  Neck: Normal range of motion. Neck supple. No thyromegaly present.  Cardiovascular: Normal rate, regular rhythm and normal heart sounds.  Pulmonary/Chest: Effort normal and breath sounds normal.  Musculoskeletal: She exhibits no edema.   Lymphadenopathy:    She has no cervical adenopathy.  Neurological: She is alert and oriented to person, place, and time.  Skin: Skin is warm and dry. No rash noted. No erythema. No pallor.  Psychiatric: She has a normal mood and affect. Her behavior is normal. Thought content normal.  Vitals reviewed.    Assessment & Plan:   1. Bacterial conjunctivitis of left eye - Begin tobramycin (TOBREX) 0.3 % ophthalmic solution; Place 2 drops into the left eye every 4 (four) hours for 5 days.  Dispense: 5 mL; Refill: 0   Meds ordered this encounter  Medications  . tobramycin (TOBREX) 0.3 % ophthalmic solution    Sig: Place 2 drops into the left eye every 4 (four) hours for 5 days.    Dispense:  5 mL    Refill:  0    Order Specific Question:   Supervising Provider    Answer:   Hoy Register [[id number]]    Follow-up: Return if symptoms worsen or fail to improve.   Loletta Specter PA

## 2017-12-05 NOTE — Addendum Note (Signed)
Addended by: Maryjean Morn on: 12/05/2017 11:33 AM   Modules accepted: Orders

## 2017-12-05 NOTE — Patient Instructions (Signed)

## 2017-12-19 ENCOUNTER — Ambulatory Visit (INDEPENDENT_AMBULATORY_CARE_PROVIDER_SITE_OTHER): Payer: Medicaid Other | Admitting: Physician Assistant

## 2017-12-19 ENCOUNTER — Encounter (INDEPENDENT_AMBULATORY_CARE_PROVIDER_SITE_OTHER): Payer: Self-pay | Admitting: Physician Assistant

## 2017-12-19 ENCOUNTER — Other Ambulatory Visit: Payer: Self-pay

## 2017-12-19 VITALS — BP 124/81 | HR 71 | Temp 97.5°F | Ht 61.0 in | Wt 127.4 lb

## 2017-12-19 DIAGNOSIS — R748 Abnormal levels of other serum enzymes: Secondary | ICD-10-CM | POA: Diagnosis not present

## 2017-12-19 DIAGNOSIS — F1020 Alcohol dependence, uncomplicated: Secondary | ICD-10-CM

## 2017-12-19 DIAGNOSIS — K643 Fourth degree hemorrhoids: Secondary | ICD-10-CM | POA: Diagnosis not present

## 2017-12-19 DIAGNOSIS — F418 Other specified anxiety disorders: Secondary | ICD-10-CM

## 2017-12-19 MED ORDER — DULOXETINE HCL 60 MG PO CPEP: 60.0000 mg | ORAL_CAPSULE | Freq: Every day | ORAL | 5 refills | Status: DC

## 2017-12-19 NOTE — Patient Instructions (Signed)

## 2017-12-19 NOTE — Progress Notes (Signed)
Subjective:  Patient ID: Betty Avery, female    DOB: [birth year]  Age: 57 y.o. MRN: [id number]  CC: f/u alcoholism and depression   HPI Betty Avery a 57 y.o.femalewith a medical history of chronic constipation, HLD, HTN, and s/p colonoscopic polypectomy presents late to her appointment again. Wants to discuss alcoholism and depression. Last PHQ9 16 and GAD7 13 nearly one month ago. She was advised to continue Cymbalta 30 mg daily. Reports taking as directed. Feeling much better in regards to mood. Says people have noticed a change for the good in her. PHQ9 9 and GAD7 5     In regards to alcoholism, says she has cut down from three to four 40 oz beers daily to half a cup of alcohol per day. Sometimes does not drink any alcohol. Took Folic acid, Naltrexone, and Lorazepam as directed. She says family members have congratulated her for her drastic reduction in alcohol consumption. She is proud of her achievement and looking forward to moving on past alcoholism.      Outpatient Medications Prior to Visit  Medication Sig Dispense Refill  . aspirin EC 81 MG tablet Take 1 tablet (81 mg total) by mouth daily. 90 tablet 3  . b complex vitamins tablet Take 1 tablet by mouth daily. 30 tablet 1  . DULoxetine (CYMBALTA) 30 MG capsule Take 1 capsule (30 mg total) by mouth daily. 30 capsule 2  . folic acid (FOLVITE) 1 MG tablet Take 1 tablet (1 mg total) by mouth daily. 30 tablet 1  . gemfibrozil (LOPID) 600 MG tablet Take 600 mg by mouth 2 (two) times daily.  0  . LORazepam (ATIVAN) 2 MG tablet Take one tablet every hour only if symptoms of delerium tremens arise. Do not take more than 5 tablets in one day. 15 tablet 0  . metoprolol succinate (TOPROL-XL) 25 MG 24 hr tablet Take 0.5 tablets (12.5 mg total) by mouth daily. 30 tablet 5  . naltrexone (DEPADE) 50 MG tablet Take 1 tablet (50 mg total) by mouth daily. (Patient not taking: Reported on 12/19/2017) 30 tablet 0  . NITROSTAT 0.4 MG SL  tablet Place 0.4 mg under the tongue every 5 (five) minutes x 3 doses as needed for chest pain.   0  . PROAIR HFA 108 (90 BASE) MCG/ACT inhaler Inhale 1-2 puffs into the lungs every 6 (six) hours as needed for wheezing or shortness of breath.   0  . omeprazole (PRILOSEC) 20 MG capsule Take 20 mg by mouth daily as needed (acid reflux, heartburn).   0  . RA VITAMIN D-3 1000 UNITS tablet Take 1,000 Units by mouth daily.  0   No facility-administered medications prior to visit.      ROS Review of Systems  Constitutional: Negative for chills, fever and malaise/fatigue.  Eyes: Negative for blurred vision.  Respiratory: Negative for shortness of breath.   Cardiovascular: Negative for chest pain and palpitations.  Gastrointestinal: Negative for abdominal pain and nausea.  Genitourinary: Negative for dysuria and hematuria.  Musculoskeletal: Negative for joint pain and myalgias.  Skin: Negative for rash.  Neurological: Negative for tingling and headaches.  Psychiatric/Behavioral: Negative for depression. The patient is not nervous/anxious.     Objective:  BP 124/81 (BP Location: Left Arm, Patient Position: Sitting, Cuff Size: Normal)   Pulse 71   Temp (!) 97.5 F (36.4 C) (Oral)   Ht 5\' 1"  (1.549 m)   Wt 127 lb 6.4 oz (57.8 kg)   LMP 09/21/2012  SpO2 96%   BMI 24.07 kg/m   BP/Weight 12/19/2017 12/05/2017 11/21/2017  Systolic BP 124 111 119  Diastolic BP 81 77 82  Wt. (Lbs) 127.4 124.2 123  BMI 24.07 23.47 23.24      Physical Exam  Constitutional: She is oriented to person, place, and time.  Well developed, well nourished, NAD, polite  HENT:  Head: Normocephalic and atraumatic.  Eyes: No scleral icterus.  Neck: Normal range of motion. Neck supple. No thyromegaly present.  Cardiovascular: Normal rate, regular rhythm and normal heart sounds.  Pulmonary/Chest: Effort normal and breath sounds normal.  Genitourinary:  Genitourinary Comments: Grade 4 non-thrombosed hemorrhoid   Musculoskeletal: She exhibits no edema.  Neurological: She is alert and oriented to person, place, and time.  Skin: Skin is warm and dry. No rash noted. No erythema. No pallor.  Psychiatric: She has a normal mood and affect. Her behavior is normal. Thought content normal.  Vitals reviewed.    Assessment & Plan:    1. Depression with anxiety - Increase DULoxetine (CYMBALTA) 60 MG capsule; Take 1 capsule (60 mg total) by mouth daily.  Dispense: 30 capsule; Refill: 5  2. Alcoholism (HCC) - Pt reports having nearly quit drinking alcohol. Pt and family is proud of her achievement. I congratulated her and provided positive reinforcement for her to continue reducing the amount of alcohol.   3. Elevated liver enzymes - Comprehensive metabolic panel  4. Grade IV hemorrhoids - Ambulatory referral to Gastroenterology   Meds ordered this encounter  Medications  . DULoxetine (CYMBALTA) 60 MG capsule    Sig: Take 1 capsule (60 mg total) by mouth daily.    Dispense:  30 capsule    Refill:  5    Order Specific Question:   Supervising Provider    Answer:   Hoy Register [[id number]]    Follow-up: Return in about 3 months (around 03/21/2018).   Loletta Specter PA

## 2017-12-20 ENCOUNTER — Telehealth (INDEPENDENT_AMBULATORY_CARE_PROVIDER_SITE_OTHER): Payer: Self-pay

## 2017-12-20 LAB — COMPREHENSIVE METABOLIC PANEL
ALBUMIN: 4.6 g/dL (ref 3.5–5.5)
ALT: 23 IU/L (ref 0–32)
AST: 31 IU/L (ref 0–40)
Albumin/Globulin Ratio: 1.5 (ref 1.2–2.2)
Alkaline Phosphatase: 89 IU/L (ref 39–117)
BUN / CREAT RATIO: 22 (ref 9–23)
BUN: 13 mg/dL (ref 6–24)
Bilirubin Total: 0.3 mg/dL (ref 0.0–1.2)
CO2: 25 mmol/L (ref 20–29)
CREATININE: 0.6 mg/dL (ref 0.57–1.00)
Calcium: 10 mg/dL (ref 8.7–10.2)
Chloride: 101 mmol/L (ref 96–106)
GFR calc Af Amer: 118 mL/min/{1.73_m2} (ref >59)
GFR calc non Af Amer: 102 mL/min/{1.73_m2} (ref >59)
GLUCOSE: 73 mg/dL (ref 65–99)
Globulin, Total: 3.1 g/dL (ref 1.5–4.5)
Potassium: 4 mmol/L (ref 3.5–5.2)
Sodium: 142 mmol/L (ref 134–144)
TOTAL PROTEIN: 7.7 g/dL (ref 6.0–8.5)

## 2017-12-20 NOTE — Telephone Encounter (Signed)
Patient is aware that liver enzymes are back to normal. Congratulated patient on such a wonderful achievement. Maryjean Morn, CMA

## 2017-12-20 NOTE — Telephone Encounter (Signed)
-----   Message from Loletta Specter, PA-C sent at 12/20/2017 12:25 PM EST ----- Please congratulate Ms. Frier. Her liver enzymes are back to normal. Rest of labs normal.

## 2018-01-03 ENCOUNTER — Ambulatory Visit (INDEPENDENT_AMBULATORY_CARE_PROVIDER_SITE_OTHER): Payer: Medicaid Other | Admitting: Physician Assistant

## 2018-03-05 ENCOUNTER — Ambulatory Visit (INDEPENDENT_AMBULATORY_CARE_PROVIDER_SITE_OTHER): Payer: Medicaid Other | Admitting: Internal Medicine

## 2018-03-07 LAB — HM COLONOSCOPY

## 2018-03-21 ENCOUNTER — Encounter (INDEPENDENT_AMBULATORY_CARE_PROVIDER_SITE_OTHER): Payer: Self-pay | Admitting: Primary Care

## 2018-03-21 ENCOUNTER — Other Ambulatory Visit: Payer: Self-pay

## 2018-03-21 ENCOUNTER — Ambulatory Visit (INDEPENDENT_AMBULATORY_CARE_PROVIDER_SITE_OTHER): Payer: Medicaid Other | Admitting: Primary Care

## 2018-03-21 VITALS — BP 152/85 | HR 76 | Temp 97.6°F | Ht 61.0 in | Wt 126.0 lb

## 2018-03-21 DIAGNOSIS — F1020 Alcohol dependence, uncomplicated: Secondary | ICD-10-CM | POA: Insufficient documentation

## 2018-03-21 DIAGNOSIS — R5383 Other fatigue: Secondary | ICD-10-CM

## 2018-03-21 DIAGNOSIS — M549 Dorsalgia, unspecified: Secondary | ICD-10-CM | POA: Diagnosis not present

## 2018-03-21 DIAGNOSIS — F329 Major depressive disorder, single episode, unspecified: Secondary | ICD-10-CM

## 2018-03-21 DIAGNOSIS — R748 Abnormal levels of other serum enzymes: Secondary | ICD-10-CM

## 2018-03-21 DIAGNOSIS — M545 Low back pain, unspecified: Secondary | ICD-10-CM

## 2018-03-21 DIAGNOSIS — I10 Essential (primary) hypertension: Secondary | ICD-10-CM | POA: Insufficient documentation

## 2018-03-21 DIAGNOSIS — F321 Major depressive disorder, single episode, moderate: Secondary | ICD-10-CM

## 2018-03-21 DIAGNOSIS — F32A Depression, unspecified: Secondary | ICD-10-CM

## 2018-03-21 DIAGNOSIS — F1010 Alcohol abuse, uncomplicated: Secondary | ICD-10-CM

## 2018-03-21 NOTE — Assessment & Plan Note (Signed)
Elevated at this visit will revisit . Pt was anxious to leave

## 2018-03-21 NOTE — Progress Notes (Signed)
Established Patient Office Visit  Subjective:  Patient ID: Betty Avery, female    DOB: 02/08/1961  Age: 58 y.o. MRN: [id number]  CC:  Chief Complaint  Patient presents with  . Follow-up    depression with anxiety  . Alcohol Intoxication    LFT's    HPI Betty Avery presents for medication refills and follow up. She express sadness due to family member just pass. Patient requesting a orth referral to [facility] for back pain s/p fall. She was standing in a chair trying to reach something out of her cabinet.   Past Medical History:  Diagnosis Date  . Chronic constipation   . Hypercholesteremia   . Hypertension   . S/P colonoscopic polypectomy 2013  . Wears glasses     Past Surgical History:  Procedure Laterality Date  . COLONOSCOPY     x2  . LEFT HEART CATHETERIZATION WITH CORONARY ANGIOGRAM N/A 02/18/2014   Procedure: LEFT HEART CATHETERIZATION WITH CORONARY ANGIOGRAM;  Surgeon: Robynn Pane, MD;  Location: [facility];  Service: Cardiovascular;  Laterality: N/A;  . ORBITAL FRACTURE SURGERY  2001   rt eye-fx-plate   . ORIF ANKLE FRACTURE Left 12/20/2016   Procedure: OPEN REDUCTION INTERNAL FIXATION (ORIF) LEFT  ANKLE FRACTURE;  Surgeon: Sheral Apley, MD;  Location: [facility];  Service: Orthopedics;  Laterality: Left;  . TONSILLECTOMY      No family history on file.  Social History   Socioeconomic History  . Marital status: Single    Spouse name: Not on file  . Number of children: Not on file  . Years of education: Not on file  . Highest education level: Not on file  Occupational History  . Not on file  Social Needs  . Financial resource strain: Not on file  . Food insecurity:    Worry: Not on file    Inability: Not on file  . Transportation needs:    Medical: Not on file    Non-medical: Not on file  Tobacco Use  . Smoking status: Never Smoker  . Smokeless tobacco: Never Used  Substance and Sexual Activity  . Alcohol use:  Yes    Alcohol/week: 14.0 standard drinks    Types: 14 Cans of beer per week    Comment: beer- alcohol level 330 when pt fell  . Drug use: No  . Sexual activity: Not on file    Comment: a few a day  Lifestyle  . Physical activity:    Days per week: Not on file    Minutes per session: Not on file  . Stress: Not on file  Relationships  . Social connections:    Talks on phone: Not on file    Gets together: Not on file    Attends religious service: Not on file    Active member of club or organization: Not on file    Attends meetings of clubs or organizations: Not on file    Relationship status: Not on file  . Intimate partner violence:    Fear of current or ex partner: Not on file    Emotionally abused: Not on file    Physically abused: Not on file    Forced sexual activity: Not on file  Other Topics Concern  . Not on file  Social History Narrative  . Not on file    Outpatient Medications Prior to Visit  Medication Sig Dispense Refill  . aspirin EC 81 MG tablet Take 1 tablet (81 mg  total) by mouth daily. 90 tablet 3  . b complex vitamins tablet Take 1 tablet by mouth daily. 30 tablet 1  . DULoxetine (CYMBALTA) 60 MG capsule Take 1 capsule (60 mg total) by mouth daily. 30 capsule 5  . folic acid (FOLVITE) 1 MG tablet Take 1 tablet (1 mg total) by mouth daily. 30 tablet 1  . gemfibrozil (LOPID) 600 MG tablet Take 600 mg by mouth 2 (two) times daily.  0  . LORazepam (ATIVAN) 2 MG tablet Take one tablet every hour only if symptoms of delerium tremens arise. Do not take more than 5 tablets in one day. 15 tablet 0  . metoprolol succinate (TOPROL-XL) 25 MG 24 hr tablet Take 0.5 tablets (12.5 mg total) by mouth daily. 30 tablet 5  . NITROSTAT 0.4 MG SL tablet Place 0.4 mg under the tongue every 5 (five) minutes x 3 doses as needed for chest pain.   0  . PROAIR HFA 108 (90 BASE) MCG/ACT inhaler Inhale 1-2 puffs into the lungs every 6 (six) hours as needed for wheezing or shortness of  breath.   0  . naltrexone (DEPADE) 50 MG tablet Take 1 tablet (50 mg total) by mouth daily. (Patient not taking: Reported on 12/19/2017) 30 tablet 0   No facility-administered medications prior to visit.     Allergies  Allergen Reactions  . Latex Itching    ROS Review of Systems  Constitutional: Negative.   HENT: Positive for dental problem.   Respiratory: Negative.   Cardiovascular: Negative.   Gastrointestinal: Negative.   Endocrine: Negative.   Genitourinary: Negative.   Musculoskeletal: Positive for back pain.  Skin: Negative.   Allergic/Immunologic: Negative.   Neurological: Negative.   Hematological: Negative.   Psychiatric/Behavioral:       Depression      Objective:     BP (!) 152/85 (BP Location: Left Arm, Patient Position: Sitting, Cuff Size: Normal)   Pulse 76   Temp 97.6 F (36.4 C) (Oral)   Ht 5\' 1"  (1.549 m)   Wt 126 lb (57.2 kg)   LMP 09/21/2012   SpO2 92%   BMI 23.81 kg/m  Wt Readings from Last 3 Encounters:  03/21/18 126 lb (57.2 kg)  12/19/17 127 lb 6.4 oz (57.8 kg)  12/05/17 124 lb 3.2 oz (56.3 kg)     Health Maintenance Due  Topic Date Due  . PAP SMEAR-Modifier  11/16/1982  . MAMMOGRAM  01/18/2018    There are no preventive care reminders to display for this patient.  Lab Results  Component Value Date   TSH 1.890 10/24/2017   Lab Results  Component Value Date   WBC 3.3 (L) 10/24/2017   HGB 13.6 10/24/2017   HCT 40.3 10/24/2017   MCV 88 10/24/2017   PLT 276 10/24/2017   Lab Results  Component Value Date   NA 142 12/19/2017   K 4.0 12/19/2017   CO2 25 12/19/2017   GLUCOSE 73 12/19/2017   BUN 13 12/19/2017   CREATININE 0.60 12/19/2017   BILITOT 0.3 12/19/2017   ALKPHOS 89 12/19/2017   AST 31 12/19/2017   ALT 23 12/19/2017   PROT 7.7 12/19/2017   ALBUMIN 4.6 12/19/2017   CALCIUM 10.0 12/19/2017   ANIONGAP 10 12/07/2016   Lab Results  Component Value Date   CHOL 229 (H) 10/24/2017   Lab Results  Component Value  Date   HDL 155 10/24/2017   Lab Results  Component Value Date   LDLCALC 61 10/24/2017  Lab Results  Component Value Date   TRIG 63 10/24/2017   Lab Results  Component Value Date   CHOLHDL 1.5 10/24/2017    Betty Avery was seen today for follow-up and medication refills  Diagnoses and all orders for this visit:  Acute bilateral low back pain without sciatica Back pain - s/p fall requesting referral to ortho S/p fall low  Ambulatory referral to Orthopedics  Alcoholism [facility](HCC)  Patient states she is not drinking as much but will not quantify   Current moderate episode of major depressive disorder, unspecified whether recurrent (HCC) -continue Cymbalta  Denies any harm to self or other . Feels it helps -Cont current meds elevated at this visit probable to resent death in family   Fatigue due to depression -  Recently had death in the family which may excalate  depression   Elevated liver enzymes - secondary to ethol abuse    Medication reviewed and refill at this visit.  Follow-up: 3months     Grayce SessionsMichelle P Edwards, NP

## 2018-03-21 NOTE — Patient Instructions (Signed)
Acute Back Pain, Adult  Acute back pain is sudden and usually short-lived. It is often caused by an injury to the muscles and tissues in the back. The injury may result from:   A muscle or ligament getting overstretched or torn (strained). Ligaments are tissues that connect bones to each other. Lifting something improperly can cause a back strain.   Wear and tear (degeneration) of the spinal disks. Spinal disks are circular tissue that provides cushioning between the bones of the spine (vertebrae).   Twisting motions, such as while playing sports or doing yard work.   A hit to the back.   Arthritis.  You may have a physical exam, lab tests, and imaging tests to find the cause of your pain. Acute back pain usually goes away with rest and home care.  Follow these instructions at home:  Managing pain, stiffness, and swelling   Take over-the-counter and prescription medicines only as told by your health care provider.   Your health care provider may recommend applying ice during the first 24-48 hours after your pain starts. To do this:  ? Put ice in a plastic bag.  ? Place a towel between your skin and the bag.  ? Leave the ice on for 20 minutes, 2-3 times a day.   If directed, apply heat to the affected area as often as told by your health care provider. Use the heat source that your health care provider recommends, such as a moist heat pack or a heating pad.  ? Place a towel between your skin and the heat source.  ? Leave the heat on for 20-30 minutes.  ? Remove the heat if your skin turns bright red. This is especially important if you are unable to feel pain, heat, or cold. You have a greater risk of getting burned.  Activity     Do not stay in bed. Staying in bed for more than 1-2 days can delay your recovery.   Sit up and stand up straight. Avoid leaning forward when you sit, or hunching over when you stand.  ? If you work at a desk, sit close to it so you do not need to lean over. Keep your chin tucked  in. Keep your neck drawn back, and keep your elbows bent at a right angle. Your arms should look like the letter "L."  ? Sit high and close to the steering wheel when you drive. Add lower back (lumbar) support to your car seat, if needed.   Take short walks on even surfaces as soon as you are able. Try to increase the length of time you walk each day.   Do not sit, drive, or stand in one place for more than 30 minutes at a time. Sitting or standing for long periods of time can put stress on your back.   Do not drive or use heavy machinery while taking prescription pain medicine.   Use proper lifting techniques. When you bend and lift, use positions that put less stress on your back:  ? Bend your knees.  ? Keep the load close to your body.  ? Avoid twisting.   Exercise regularly as told by your health care provider. Exercising helps your back heal faster and helps prevent back injuries by keeping muscles strong and flexible.   Work with a physical therapist to make a safe exercise program, as recommended by your health care provider. Do any exercises as told by your physical therapist.  Lifestyle   Maintain   a healthy weight. Extra weight puts stress on your back and makes it difficult to have good posture.   Avoid activities or situations that make you feel anxious or stressed. Stress and anxiety increase muscle tension and can make back pain worse. Learn ways to manage anxiety and stress, such as through exercise.  General instructions   Sleep on a firm mattress in a comfortable position. Try lying on your side with your knees slightly bent. If you lie on your back, put a pillow under your knees.   Follow your treatment plan as told by your health care provider. This may include:  ? Cognitive or behavioral therapy.  ? Acupuncture or massage therapy.  ? Meditation or yoga.  Contact a health care provider if:   You have pain that is not relieved with rest or medicine.   You have increasing pain going down  into your legs or buttocks.   Your pain does not improve after 2 weeks.   You have pain at night.   You lose weight without trying.   You have a fever or chills.  Get help right away if:   You develop new bowel or bladder control problems.   You have unusual weakness or numbness in your arms or legs.   You develop nausea or vomiting.   You develop abdominal pain.   You feel faint.  Summary   Acute back pain is sudden and usually short-lived.   Use proper lifting techniques. When you bend and lift, use positions that put less stress on your back.   Take over-the-counter and prescription medicines and apply heat or ice as directed by your health care provider.  This information is not intended to replace advice given to you by your health care provider. Make sure you discuss any questions you have with your health care provider.  Document Released: 01/31/2005 Document Revised: 09/07/2017 Document Reviewed: 09/14/2016  Elsevier Interactive Patient Education  2019 [employer]

## 2018-03-26 ENCOUNTER — Telehealth (INDEPENDENT_AMBULATORY_CARE_PROVIDER_SITE_OTHER): Payer: Self-pay | Admitting: Physician Assistant

## 2018-03-26 NOTE — Telephone Encounter (Signed)
Patient called to request a med refill for naltrexone (DEPADE) 50 MG tablet  Sates that PCP would send a request on the day of visit. Patient went to pharmacy to pick up the Rx but was told that there was no Rx in the system and her PCP would need to send a new Rx.  Patient uses [facility] [phone] - [place], [place] - [address]  Please advise [phone]  Thank you Louisa Second

## 2018-03-26 NOTE — Telephone Encounter (Signed)
Please refill Rx. Maryjean Morn, CMA

## 2018-04-27 ENCOUNTER — Telehealth (INDEPENDENT_AMBULATORY_CARE_PROVIDER_SITE_OTHER): Payer: Self-pay

## 2018-04-27 NOTE — Telephone Encounter (Signed)
Patient called requesting refill of gemfibrozil, Duloxetine and naltrexone sent to [facility] on [address]. Maryjean Morn, CMA

## 2018-04-30 ENCOUNTER — Other Ambulatory Visit (INDEPENDENT_AMBULATORY_CARE_PROVIDER_SITE_OTHER): Payer: Self-pay | Admitting: Primary Care

## 2018-04-30 DIAGNOSIS — F418 Other specified anxiety disorders: Secondary | ICD-10-CM

## 2018-04-30 MED ORDER — NALTREXONE HCL 50 MG PO TABS: 50.0000 mg | ORAL_TABLET | Freq: Every day | ORAL | 2 refills | Status: DC

## 2018-04-30 MED ORDER — GEMFIBROZIL 600 MG PO TABS: 600.0000 mg | ORAL_TABLET | Freq: Two times a day (BID) | ORAL | 3 refills | Status: DC

## 2018-04-30 MED ORDER — DULOXETINE HCL 60 MG PO CPEP: 60.0000 mg | ORAL_CAPSULE | Freq: Every day | ORAL | 5 refills | Status: DC

## 2018-04-30 NOTE — Telephone Encounter (Signed)
All 3 meds sent to new harmacy with refills

## 2018-04-30 NOTE — Telephone Encounter (Signed)
Patient is aware that refills have been sent. Maryjean Morn, CMA

## 2018-05-21 ENCOUNTER — Other Ambulatory Visit: Payer: Self-pay

## 2018-05-21 ENCOUNTER — Ambulatory Visit (INDEPENDENT_AMBULATORY_CARE_PROVIDER_SITE_OTHER): Payer: Medicaid Other | Admitting: Primary Care

## 2018-05-21 ENCOUNTER — Ambulatory Visit: Payer: Medicaid Other | Attending: Primary Care | Admitting: Primary Care

## 2018-05-21 NOTE — Progress Notes (Deleted)
Established Patient Office Visit  Subjective:  Patient ID: Betty Avery, female    DOB: 1960-03-21  Age: 58 y.o. MRN: [id number]  CC: No chief complaint on file.   HPI Betty Avery presents for ***  Past Medical History:  Diagnosis Date  . Chronic constipation   . Hypercholesteremia   . Hypertension   . S/P colonoscopic polypectomy 2013  . Wears glasses     Past Surgical History:  Procedure Laterality Date  . COLONOSCOPY     x2  . LEFT HEART CATHETERIZATION WITH CORONARY ANGIOGRAM N/A 02/18/2014   Procedure: LEFT HEART CATHETERIZATION WITH CORONARY ANGIOGRAM;  Surgeon: Robynn Pane, MD;  Location: [facility];  Service: Cardiovascular;  Laterality: N/A;  . ORBITAL FRACTURE SURGERY  2001   rt eye-fx-plate   . ORIF ANKLE FRACTURE Left 12/20/2016   Procedure: OPEN REDUCTION INTERNAL FIXATION (ORIF) LEFT  ANKLE FRACTURE;  Surgeon: Sheral Apley, MD;  Location: [facility];  Service: Orthopedics;  Laterality: Left;  . TONSILLECTOMY      No family history on file.  Social History   Socioeconomic History  . Marital status: Single    Spouse name: Not on file  . Number of children: Not on file  . Years of education: Not on file  . Highest education level: Not on file  Occupational History  . Not on file  Social Needs  . Financial resource strain: Not on file  . Food insecurity:    Worry: Not on file    Inability: Not on file  . Transportation needs:    Medical: Not on file    Non-medical: Not on file  Tobacco Use  . Smoking status: Never Smoker  . Smokeless tobacco: Never Used  Substance and Sexual Activity  . Alcohol use: Yes    Alcohol/week: 14.0 standard drinks    Types: 14 Cans of beer per week    Comment: beer- alcohol level 330 when pt fell  . Drug use: No  . Sexual activity: Not on file    Comment: a few a day  Lifestyle  . Physical activity:    Days per week: Not on file    Minutes per session: Not on file  . Stress: Not on  file  Relationships  . Social connections:    Talks on phone: Not on file    Gets together: Not on file    Attends religious service: Not on file    Active member of club or organization: Not on file    Attends meetings of clubs or organizations: Not on file    Relationship status: Not on file  . Intimate partner violence:    Fear of current or ex partner: Not on file    Emotionally abused: Not on file    Physically abused: Not on file    Forced sexual activity: Not on file  Other Topics Concern  . Not on file  Social History Narrative  . Not on file    Outpatient Medications Prior to Visit  Medication Sig Dispense Refill  . aspirin EC 81 MG tablet Take 1 tablet (81 mg total) by mouth daily. 90 tablet 3  . b complex vitamins tablet Take 1 tablet by mouth daily. 30 tablet 1  . DULoxetine (CYMBALTA) 60 MG capsule Take 1 capsule (60 mg total) by mouth daily. 30 capsule 5  . folic acid (FOLVITE) 1 MG tablet Take 1 tablet (1 mg total) by mouth daily. 30 tablet  1  . gemfibrozil (LOPID) 600 MG tablet Take 1 tablet (600 mg total) by mouth 2 (two) times daily. 60 tablet 3  . LORazepam (ATIVAN) 2 MG tablet Take one tablet every hour only if symptoms of delerium tremens arise. Do not take more than 5 tablets in one day. 15 tablet 0  . metoprolol succinate (TOPROL-XL) 25 MG 24 hr tablet Take 0.5 tablets (12.5 mg total) by mouth daily. 30 tablet 5  . naltrexone (DEPADE) 50 MG tablet Take 1 tablet (50 mg total) by mouth daily. 30 tablet 2  . NITROSTAT 0.4 MG SL tablet Place 0.4 mg under the tongue every 5 (five) minutes x 3 doses as needed for chest pain.   0  . PROAIR HFA 108 (90 BASE) MCG/ACT inhaler Inhale 1-2 puffs into the lungs every 6 (six) hours as needed for wheezing or shortness of breath.   0   No facility-administered medications prior to visit.     Allergies  Allergen Reactions  . Latex Itching    ROS Review of Systems  Constitutional: Positive for fatigue.  HENT: Negative.    Eyes: Negative.   Respiratory: Negative.   Cardiovascular: Negative.   Gastrointestinal: Negative.   Endocrine: Negative.   Genitourinary: Negative.   Musculoskeletal: Positive for back pain.  Allergic/Immunologic: Negative.   Neurological: Negative.   Hematological: Negative.   Psychiatric/Behavioral: Positive for agitation. The patient is nervous/anxious.       Objective:    Physical Exam  Constitutional: She is oriented to person, place, and time. She appears well-developed and well-nourished.  Cardiovascular: Normal rate and regular rhythm.  Pulmonary/Chest: Effort normal and breath sounds normal.  Abdominal: Soft. Bowel sounds are normal.  Musculoskeletal: Normal range of motion.  Neurological: She is alert and oriented to person, place, and time.  Psychiatric: She has a normal mood and affect.    LMP 09/21/2012  Wt Readings from Last 3 Encounters:  03/21/18 126 lb (57.2 kg)  12/19/17 127 lb 6.4 oz (57.8 kg)  12/05/17 124 lb 3.2 oz (56.3 kg)     Health Maintenance Due  Topic Date Due  . PAP SMEAR-Modifier  11/16/1982  . MAMMOGRAM  01/18/2018    There are no preventive care reminders to display for this patient.  Lab Results  Component Value Date   TSH 1.890 10/24/2017   Lab Results  Component Value Date   WBC 3.3 (L) 10/24/2017   HGB 13.6 10/24/2017   HCT 40.3 10/24/2017   MCV 88 10/24/2017   PLT 276 10/24/2017   Lab Results  Component Value Date   NA 142 12/19/2017   K 4.0 12/19/2017   CO2 25 12/19/2017   GLUCOSE 73 12/19/2017   BUN 13 12/19/2017   CREATININE 0.60 12/19/2017   BILITOT 0.3 12/19/2017   ALKPHOS 89 12/19/2017   AST 31 12/19/2017   ALT 23 12/19/2017   PROT 7.7 12/19/2017   ALBUMIN 4.6 12/19/2017   CALCIUM 10.0 12/19/2017   ANIONGAP 10 12/07/2016   Lab Results  Component Value Date   CHOL 229 (H) 10/24/2017   Lab Results  Component Value Date   HDL 155 10/24/2017   Lab Results  Component Value Date   LDLCALC 61  10/24/2017   Lab Results  Component Value Date   TRIG 63 10/24/2017   Lab Results  Component Value Date   CHOLHDL 1.5 10/24/2017   No results found for: HGBA1C    Assessment & Plan:   Problem List Items Addressed This Visit  None      No orders of the defined types were placed in this encounter.   Follow-up: No follow-ups on file.    Grayce Sessions, NP This encounter was created in error - please disregard.

## 2018-05-21 NOTE — Progress Notes (Signed)
This encounter was created in error - please disregard.

## 2018-06-12 ENCOUNTER — Ambulatory Visit: Payer: Medicaid Other | Admitting: Primary Care

## 2018-09-07 ENCOUNTER — Other Ambulatory Visit: Payer: Self-pay | Admitting: Internal Medicine

## 2018-09-07 DIAGNOSIS — R1011 Right upper quadrant pain: Secondary | ICD-10-CM

## 2018-09-07 DIAGNOSIS — R748 Abnormal levels of other serum enzymes: Secondary | ICD-10-CM

## 2018-09-17 ENCOUNTER — Ambulatory Visit
Admission: RE | Admit: 2018-09-17 | Discharge: 2018-09-17 | Disposition: A | Discharge status: Home / Self Care | Payer: Medicaid Other | Source: Ambulatory Visit | Attending: Internal Medicine | Admitting: Internal Medicine

## 2018-09-17 DIAGNOSIS — R1011 Right upper quadrant pain: Secondary | ICD-10-CM

## 2018-09-17 DIAGNOSIS — R748 Abnormal levels of other serum enzymes: Secondary | ICD-10-CM

## 2018-09-26 ENCOUNTER — Other Ambulatory Visit (INDEPENDENT_AMBULATORY_CARE_PROVIDER_SITE_OTHER): Payer: Self-pay | Admitting: Primary Care

## 2018-09-26 DIAGNOSIS — I10 Essential (primary) hypertension: Secondary | ICD-10-CM

## 2018-09-26 MED ORDER — METOPROLOL SUCCINATE ER 25 MG PO TB24: 12.5000 mg | ORAL_TABLET | Freq: Every day | ORAL | 3 refills | Status: DC

## 2018-11-09 ENCOUNTER — Ambulatory Visit (INDEPENDENT_AMBULATORY_CARE_PROVIDER_SITE_OTHER): Payer: Medicaid Other | Admitting: Primary Care

## 2018-11-09 ENCOUNTER — Encounter (INDEPENDENT_AMBULATORY_CARE_PROVIDER_SITE_OTHER): Payer: Self-pay | Admitting: Primary Care

## 2018-11-09 ENCOUNTER — Other Ambulatory Visit: Payer: Self-pay

## 2018-11-09 VITALS — BP 105/79 | HR 98 | Temp 98.7°F | Ht 61.0 in | Wt 118.6 lb

## 2018-11-09 DIAGNOSIS — E7800 Pure hypercholesterolemia, unspecified: Secondary | ICD-10-CM | POA: Diagnosis not present

## 2018-11-09 DIAGNOSIS — Z1239 Encounter for other screening for malignant neoplasm of breast: Secondary | ICD-10-CM

## 2018-11-09 DIAGNOSIS — M25512 Pain in left shoulder: Secondary | ICD-10-CM | POA: Diagnosis not present

## 2018-11-09 DIAGNOSIS — F418 Other specified anxiety disorders: Secondary | ICD-10-CM | POA: Diagnosis not present

## 2018-11-09 DIAGNOSIS — I10 Essential (primary) hypertension: Secondary | ICD-10-CM | POA: Diagnosis not present

## 2018-11-09 MED ORDER — IBUPROFEN 600 MG PO TABS: 600.0000 mg | ORAL_TABLET | Freq: Three times a day (TID) | ORAL | 1 refills | Status: DC | PRN

## 2018-11-09 MED ORDER — DULOXETINE HCL 60 MG PO CPEP: 60.0000 mg | ORAL_CAPSULE | Freq: Every day | ORAL | 3 refills | Status: DC

## 2018-11-09 MED ORDER — METOPROLOL SUCCINATE ER 25 MG PO TB24: 12.5000 mg | ORAL_TABLET | Freq: Every day | ORAL | 3 refills | Status: DC

## 2018-11-09 MED ORDER — AMLODIPINE BESYLATE 2.5 MG PO TABS: 2.5000 mg | ORAL_TABLET | Freq: Every day | ORAL | 3 refills | Status: DC

## 2018-11-09 NOTE — Progress Notes (Signed)
m  Acute Office Visit  Subjective:    Patient ID: Betty Avery, female    DOB: 12/10/1960, 58 y.o.   MRN: [id number]  Chief Complaint  Patient presents with  . Pain    left shoulder pain    HPI Patient is in today for an acute visit she is having left shoulder pain and right ankle denies falls or injuries. Rom wnl on assessment no pain elicited but complains of tightness.  Past Medical History:  Diagnosis Date  . Chronic constipation   . Hypercholesteremia   . Hypertension   . S/P colonoscopic polypectomy 2013  . Wears glasses     Past Surgical History:  Procedure Laterality Date  . COLONOSCOPY     x2  . LEFT HEART CATHETERIZATION WITH CORONARY ANGIOGRAM N/A 02/18/2014   Procedure: LEFT HEART CATHETERIZATION WITH CORONARY ANGIOGRAM;  Surgeon: Clent Demark, MD;  Location: [facility];  Service: Cardiovascular;  Laterality: N/A;  . ORBITAL FRACTURE SURGERY  2001   rt eye-fx-plate   . ORIF ANKLE FRACTURE Left 12/20/2016   Procedure: OPEN REDUCTION INTERNAL FIXATION (ORIF) LEFT  ANKLE FRACTURE;  Surgeon: Renette Butters, MD;  Location: [facility];  Service: Orthopedics;  Laterality: Left;  . TONSILLECTOMY      No family history on file.  Social History   Socioeconomic History  . Marital status: Single    Spouse name: Not on file  . Number of children: Not on file  . Years of education: Not on file  . Highest education level: Not on file  Occupational History  . Not on file  Social Needs  . Financial resource strain: Not on file  . Food insecurity    Worry: Not on file    Inability: Not on file  . Transportation needs    Medical: Not on file    Non-medical: Not on file  Tobacco Use  . Smoking status: Never Smoker  . Smokeless tobacco: Never Used  Substance and Sexual Activity  . Alcohol use: Yes    Alcohol/week: 14.0 standard drinks    Types: 14 Cans of beer per week    Comment: beer- alcohol level 330 when pt fell  . Drug use: No  .  Sexual activity: Not on file    Comment: a few a day  Lifestyle  . Physical activity    Days per week: Not on file    Minutes per session: Not on file  . Stress: Not on file  Relationships  . Social Herbalist on phone: Not on file    Gets together: Not on file    Attends religious service: Not on file    Active member of club or organization: Not on file    Attends meetings of clubs or organizations: Not on file    Relationship status: Not on file  . Intimate partner violence    Fear of current or ex partner: Not on file    Emotionally abused: Not on file    Physically abused: Not on file    Forced sexual activity: Not on file  Other Topics Concern  . Not on file  Social History Narrative  . Not on file    Outpatient Medications Prior to Visit  Medication Sig Dispense Refill  . aspirin EC 81 MG tablet Take 1 tablet (81 mg total) by mouth daily. 90 tablet 3  . naltrexone (DEPADE) 50 MG tablet Take 1 tablet (50 mg total) by mouth  daily. 30 tablet 2  . PROAIR HFA 108 (90 BASE) MCG/ACT inhaler Inhale 1-2 puffs into the lungs every 6 (six) hours as needed for wheezing or shortness of breath.   0  . amLODipine (NORVASC) 2.5 MG tablet Take 2.5 mg by mouth daily.    . DULoxetine (CYMBALTA) 60 MG capsule Take 1 capsule (60 mg total) by mouth daily. 30 capsule 5  . gemfibrozil (LOPID) 600 MG tablet Take 1 tablet (600 mg total) by mouth 2 (two) times daily. 60 tablet 3  . metoprolol succinate (TOPROL-XL) 25 MG 24 hr tablet Take 0.5 tablets (12.5 mg total) by mouth daily. 30 tablet 3  . b complex vitamins tablet Take 1 tablet by mouth daily. 30 tablet 1  . folic acid (FOLVITE) 1 MG tablet Take 1 tablet (1 mg total) by mouth daily. 30 tablet 1  . LORazepam (ATIVAN) 2 MG tablet Take one tablet every hour only if symptoms of delerium tremens arise. Do not take more than 5 tablets in one day. (Patient not taking: Reported on 11/09/2018) 15 tablet 0  . meloxicam (MOBIC) 15 MG tablet  Take 15 mg by mouth daily.    Marland Kitchen NITROSTAT 0.4 MG SL tablet Place 0.4 mg under the tongue every 5 (five) minutes x 3 doses as needed for chest pain.   0  . omeprazole (PRILOSEC) 20 MG capsule Take 20 mg by mouth daily.    Marland Kitchen tiZANidine (ZANAFLEX) 4 MG tablet Take 5 mg by mouth 3 (three) times daily as needed.     No facility-administered medications prior to visit.     Allergies  Allergen Reactions  . Latex Itching    Review of Systems  Gastrointestinal: Positive for diarrhea.  Neurological: Positive for headaches.       Objective:    Physical Exam  Constitutional: She is oriented to person, place, and time. She appears well-developed and well-nourished.  HENT:  Head: Normocephalic.  Cardiovascular: Normal rate.  Pulmonary/Chest: Effort normal and breath sounds normal.  Abdominal: Soft. Bowel sounds are normal.  Musculoskeletal: Normal range of motion.  Neurological: She is alert and oriented to person, place, and time.  Skin: Skin is warm and dry.  Psychiatric: She has a normal mood and affect.    BP 105/79 (BP Location: Left Arm, Patient Position: Sitting, Cuff Size: Normal)   Pulse 98   Temp 98.7 F (37.1 C) (Tympanic)   Ht '5\' 1"'$  (1.549 m)   Wt 118 lb 9.6 oz (53.8 kg)   LMP 09/21/2012   SpO2 98%   BMI 22.41 kg/m  Wt Readings from Last 3 Encounters:  11/09/18 118 lb 9.6 oz (53.8 kg)  03/21/18 126 lb (57.2 kg)  12/19/17 127 lb 6.4 oz (57.8 kg)    Health Maintenance Due  Topic Date Due  . PAP SMEAR-Modifier  11/16/1982  . MAMMOGRAM  01/18/2018    There are no preventive care reminders to display for this patient.   Lab Results  Component Value Date   TSH 1.890 10/24/2017   Lab Results  Component Value Date   WBC 4.8 11/09/2018   HGB 14.0 11/09/2018   HCT 42.7 11/09/2018   MCV 87 11/09/2018   PLT 326 11/09/2018   Lab Results  Component Value Date   NA 137 11/09/2018   K 4.8 11/09/2018   CO2 24 11/09/2018   GLUCOSE 102 (H) 11/09/2018   BUN 8  11/09/2018   CREATININE 0.78 11/09/2018   BILITOT 0.7 11/09/2018   ALKPHOS 112  11/09/2018   AST 108 (H) 11/09/2018   ALT 60 (H) 11/09/2018   PROT 8.6 (H) 11/09/2018   ALBUMIN 5.5 (H) 11/09/2018   CALCIUM 10.6 (H) 11/09/2018   ANIONGAP 10 12/07/2016   Lab Results  Component Value Date   CHOL 259 (H) 11/09/2018   Lab Results  Component Value Date   HDL 167 11/09/2018   Lab Results  Component Value Date   LDLCALC 86 11/09/2018   Lab Results  Component Value Date   TRIG 37 11/09/2018   Lab Results  Component Value Date   CHOLHDL 1.6 11/09/2018   No results found for: HGBA1C     Assessment & Plan:  Maniah was seen today for pain.  Diagnoses and all orders for this visit:  Breast cancer screening Mammogram are recommended for women age 45.  This is designed for women who are at average risk for breast cancer most beneficial screening  mammography  this may reduce the risk for breast cancer death. -     MM Digital Diagnostic Bilat; Future  Pain in joint of left shoulder  May alternate with heat and ice application for pain relief. May also alternate with acetaminophen and Ibuprofen as prescribed pain relief. Other alternatives include massage, acupuncture and water aerobics.  You must stay active and avoid a sedentary lifestyle.   Hypertension, unspecified type Counseled on blood pressure goal of less than 130/80, low-sodium, DASH diet, medication compliance, 150 minutes of moderate intensity exercise per week. Discussed medication compliance, adverse effects. -     metoprolol succinate (TOPROL-XL) 25 MG 24 hr tablet; Take 0.5 tablets (12.5 mg total) by mouth daily. -     CBC with Differential -     CMP14+EGFR  Depression with anxiety Depression can affect thoughts, feelings & sleep. Can lead to changes in mood ,relationships, daily activities & physical health, caused by interference in brain chemicals.  -     DULoxetine (CYMBALTA) 60 MG capsule; Take 1 capsule (60  mg total) by mouth daily.  Pure hypercholesterolemia To lower your number you can decrease your fatty foods, red meat, cheese, milk and increase fiber like whole grains and veggies. You can also add a fiber supplement like Metamucil or Benefiber.  -     Lipid Panel  Other orders -     ibuprofen (ADVIL) 600 MG tablet; Take 1 tablet (600 mg total) by mouth every 8 (eight) hours as needed for moderate pain. -     amLODipine (NORVASC) 2.5 MG tablet; Take 1 tablet (2.5 mg total) by mouth daily.    Meds ordered this encounter  Medications  . ibuprofen (ADVIL) 600 MG tablet    Sig: Take 1 tablet (600 mg total) by mouth every 8 (eight) hours as needed for moderate pain.    Dispense:  60 tablet    Refill:  1  . metoprolol succinate (TOPROL-XL) 25 MG 24 hr tablet    Sig: Take 0.5 tablets (12.5 mg total) by mouth daily.    Dispense:  30 tablet    Refill:  3  . amLODipine (NORVASC) 2.5 MG tablet    Sig: Take 1 tablet (2.5 mg total) by mouth daily.    Dispense:  30 tablet    Refill:  3  . DULoxetine (CYMBALTA) 60 MG capsule    Sig: Take 1 capsule (60 mg total) by mouth daily.    Dispense:  90 capsule    Refill:  Casa, NP

## 2018-11-09 NOTE — Patient Instructions (Signed)
Shoulder Pain Many things can cause shoulder pain, including:  An injury.  Moving the shoulder in the same way again and again (overuse).  Joint pain (arthritis). Pain can come from:  Swelling and irritation (inflammation) of any part of the shoulder.  An injury to the shoulder joint.  An injury to: ? Tissues that connect muscle to bone (tendons). ? Tissues that connect bones to each other (ligaments). ? Bones. Follow these instructions at home: Watch for changes in your symptoms. Let your doctor know about them. Follow these instructions to help with your pain. If you have a sling:  Wear the sling as told by your doctor. Remove it only as told by your doctor.  Loosen the sling if your fingers: ? Tingle. ? Become numb. ? Turn cold and blue.  Keep the sling clean.  If the sling is not waterproof: ? Do not let it get wet. ? Take the sling off when you shower or bathe. Managing pain, stiffness, and swelling   If told, put ice on the painful area: ? Put ice in a plastic bag. ? Place a towel between your skin and the bag. ? Leave the ice on for 20 minutes, 2-3 times a day. Stop putting ice on if it does not help with the pain.  Squeeze a soft ball or a foam pad as much as possible. This prevents swelling in the shoulder. It also helps to strengthen the arm. General instructions  Take over-the-counter and prescription medicines only as told by your doctor.  Keep all follow-up visits as told by your doctor. This is important. Contact a doctor if:  Your pain gets worse.  Medicine does not help your pain.  You have new pain in your arm, hand, or fingers. Get help right away if:  Your arm, hand, or fingers: ? Tingle. ? Are numb. ? Are swollen. ? Are painful. ? Turn white or blue. Summary  Shoulder pain can be caused by many things. These include injury, moving the shoulder in the same away again and again, and joint pain.  Watch for changes in your symptoms.  Let your doctor know about them.  This condition may be treated with a sling, ice, and pain medicine.  Contact your doctor if the pain gets worse or you have new pain. Get help right away if your arm, hand, or fingers tingle or get numb, swollen, or painful.  Keep all follow-up visits as told by your doctor. This is important. This information is not intended to replace advice given to you by your health care provider. Make sure you discuss any questions you have with your health care provider. Document Released: 07/20/2007 Document Revised: 08/15/2017 Document Reviewed: 08/15/2017 Elsevier Patient Education  2020 [employer]  

## 2018-11-10 ENCOUNTER — Other Ambulatory Visit (INDEPENDENT_AMBULATORY_CARE_PROVIDER_SITE_OTHER): Payer: Self-pay | Admitting: Primary Care

## 2018-11-10 DIAGNOSIS — F1020 Alcohol dependence, uncomplicated: Secondary | ICD-10-CM

## 2018-11-10 LAB — CMP14+EGFR
ALT: 60 IU/L — ABNORMAL HIGH (ref 0–32)
AST: 108 IU/L — ABNORMAL HIGH (ref 0–40)
Albumin/Globulin Ratio: 1.8 (ref 1.2–2.2)
Albumin: 5.5 g/dL — ABNORMAL HIGH (ref 3.8–4.9)
Alkaline Phosphatase: 112 IU/L (ref 39–117)
BUN/Creatinine Ratio: 10 (ref 9–23)
BUN: 8 mg/dL (ref 6–24)
Bilirubin Total: 0.7 mg/dL (ref 0.0–1.2)
CO2: 24 mmol/L (ref 20–29)
Calcium: 10.6 mg/dL — ABNORMAL HIGH (ref 8.7–10.2)
Chloride: 96 mmol/L (ref 96–106)
Creatinine, Ser: 0.78 mg/dL (ref 0.57–1.00)
GFR calc Af Amer: 98 mL/min/{1.73_m2} (ref >59)
GFR calc non Af Amer: 85 mL/min/{1.73_m2} (ref >59)
Globulin, Total: 3.1 g/dL (ref 1.5–4.5)
Glucose: 102 mg/dL — ABNORMAL HIGH (ref 65–99)
Potassium: 4.8 mmol/L (ref 3.5–5.2)
Sodium: 137 mmol/L (ref 134–144)
Total Protein: 8.6 g/dL — ABNORMAL HIGH (ref 6.0–8.5)

## 2018-11-10 LAB — CBC WITH DIFFERENTIAL/PLATELET
Basophils Absolute: 0 10*3/uL (ref 0.0–0.2)
Basos: 1 %
EOS (ABSOLUTE): 0.1 10*3/uL (ref 0.0–0.4)
Eos: 3 %
Hematocrit: 42.7 % (ref 34.0–46.6)
Hemoglobin: 14 g/dL (ref 11.1–15.9)
Immature Grans (Abs): 0 10*3/uL (ref 0.0–0.1)
Immature Granulocytes: 0 %
Lymphocytes Absolute: 1.8 10*3/uL (ref 0.7–3.1)
Lymphs: 37 %
MCH: 28.5 pg (ref 26.6–33.0)
MCHC: 32.8 g/dL (ref 31.5–35.7)
MCV: 87 fL (ref 79–97)
Monocytes Absolute: 0.5 10*3/uL (ref 0.1–0.9)
Monocytes: 10 %
Neutrophils Absolute: 2.4 10*3/uL (ref 1.4–7.0)
Neutrophils: 49 %
Platelets: 326 10*3/uL (ref 150–450)
RBC: 4.92 x10E6/uL (ref 3.77–5.28)
RDW: 12.5 % (ref 11.7–15.4)
WBC: 4.8 10*3/uL (ref 3.4–10.8)

## 2018-11-10 LAB — LIPID PANEL
Chol/HDL Ratio: 1.6 ratio (ref 0.0–4.4)
Cholesterol, Total: 259 mg/dL — ABNORMAL HIGH (ref 100–199)
HDL: 167 mg/dL (ref >39)
LDL Chol Calc (NIH): 86 mg/dL (ref 0–99)
Triglycerides: 37 mg/dL (ref 0–149)
VLDL Cholesterol Cal: 6 mg/dL (ref 5–40)

## 2018-11-10 MED ORDER — GEMFIBROZIL 600 MG PO TABS: 600.0000 mg | ORAL_TABLET | Freq: Two times a day (BID) | ORAL | 3 refills | Status: DC

## 2018-11-12 ENCOUNTER — Telehealth (INDEPENDENT_AMBULATORY_CARE_PROVIDER_SITE_OTHER): Payer: Self-pay

## 2018-11-12 NOTE — Telephone Encounter (Signed)
Patient is aware that cholesterol is elevated, take Lopid twice a day. Liver enzymes elevated. Advised patient to stop drinking. Nat Christen, CMA

## 2018-11-12 NOTE — Telephone Encounter (Signed)
-----   Message from Kerin Perna, NP sent at 11/10/2018  9:04 PM EDT ----- Elevated cholesterol remind her to take her Lopid twice daily, liver enzymes elevated secondary to alcohol use .Stop drinking alcohol. Will monitor Ca, TPP, and albumin

## 2018-11-13 ENCOUNTER — Encounter: Payer: Self-pay | Admitting: Primary Care

## 2018-11-14 ENCOUNTER — Emergency Department (HOSPITAL_COMMUNITY): Payer: Medicaid Other

## 2018-11-14 ENCOUNTER — Other Ambulatory Visit: Payer: Self-pay

## 2018-11-14 ENCOUNTER — Encounter (HOSPITAL_COMMUNITY): Payer: Self-pay | Admitting: Emergency Medicine

## 2018-11-14 ENCOUNTER — Emergency Department (HOSPITAL_COMMUNITY)
Admission: EM | Admit: 2018-11-14 | Discharge: 2018-11-15 | Disposition: A | Discharge status: Home / Self Care | Payer: Medicaid Other | Attending: Emergency Medicine | Admitting: Emergency Medicine

## 2018-11-14 DIAGNOSIS — Z79899 Other long term (current) drug therapy: Secondary | ICD-10-CM | POA: Insufficient documentation

## 2018-11-14 DIAGNOSIS — Z7982 Long term (current) use of aspirin: Secondary | ICD-10-CM | POA: Diagnosis not present

## 2018-11-14 DIAGNOSIS — R0789 Other chest pain: Secondary | ICD-10-CM | POA: Insufficient documentation

## 2018-11-14 DIAGNOSIS — R079 Chest pain, unspecified: Secondary | ICD-10-CM

## 2018-11-14 DIAGNOSIS — I10 Essential (primary) hypertension: Secondary | ICD-10-CM | POA: Diagnosis not present

## 2018-11-14 DIAGNOSIS — Z91040 Latex allergy status: Secondary | ICD-10-CM | POA: Insufficient documentation

## 2018-11-14 LAB — CBC
HCT: 34.9 % — ABNORMAL LOW (ref 36.0–46.0)
Hemoglobin: 12 g/dL (ref 12.0–15.0)
MCH: 30 pg (ref 26.0–34.0)
MCHC: 34.4 g/dL (ref 30.0–36.0)
MCV: 87.3 fL (ref 80.0–100.0)
Platelets: 274 10*3/uL (ref 150–400)
RBC: 4 MIL/uL (ref 3.87–5.11)
RDW: 13.5 % (ref 11.5–15.5)
WBC: 5.9 10*3/uL (ref 4.0–10.5)
nRBC: 0 % (ref 0.0–0.2)

## 2018-11-14 LAB — BASIC METABOLIC PANEL WITH GFR
Anion gap: 12 (ref 5–15)
BUN: 14 mg/dL (ref 6–20)
CO2: 22 mmol/L (ref 22–32)
Calcium: 10 mg/dL (ref 8.9–10.3)
Chloride: 94 mmol/L — ABNORMAL LOW (ref 98–111)
Creatinine, Ser: 0.74 mg/dL (ref 0.44–1.00)
GFR calc Af Amer: >60 mL/min
GFR calc non Af Amer: >60 mL/min
Glucose, Bld: 92 mg/dL (ref 70–99)
Potassium: 4.4 mmol/L (ref 3.5–5.1)
Sodium: 128 mmol/L — ABNORMAL LOW (ref 135–145)

## 2018-11-14 LAB — I-STAT BETA HCG BLOOD, ED (MC, WL, AP ONLY): I-stat hCG, quantitative: <5 m[IU]/mL

## 2018-11-14 LAB — PROTIME-INR
INR: 1.1 (ref 0.8–1.2)
Prothrombin Time: 13.8 seconds (ref 11.4–15.2)

## 2018-11-14 LAB — TROPONIN I (HIGH SENSITIVITY): Troponin I (High Sensitivity): 6 ng/L

## 2018-11-14 MED ORDER — SODIUM CHLORIDE 0.9% FLUSH: 3.0000 mL | Freq: Once | INTRAVENOUS | Status: DC

## 2018-11-14 NOTE — ED Triage Notes (Signed)
Patient reports left chest pain for 4 days with mild SOB , denies emesis or diaphoresis , no cough or fever .

## 2018-11-15 LAB — TROPONIN I (HIGH SENSITIVITY): Troponin I (High Sensitivity): 4 ng/L (ref <18)

## 2018-11-15 LAB — D-DIMER, QUANTITATIVE: D-Dimer, Quant: <0.27 ug/mL-FEU (ref 0.00–0.50)

## 2018-11-15 MED ORDER — SODIUM CHLORIDE 0.9 % IV BOLUS
1000.0000 mL | Freq: Once | INTRAVENOUS | Status: AC
  Administered 2018-11-15: 1000 mL via INTRAVENOUS

## 2018-11-15 MED ORDER — CYCLOBENZAPRINE HCL 10 MG PO TABS: 10.0000 mg | ORAL_TABLET | Freq: Two times a day (BID) | ORAL | 0 refills | Status: DC | PRN

## 2018-11-15 NOTE — ED Provider Notes (Signed)
Patient seen/examined in the Emergency Department in conjunction with Advanced Practice Provider [facility] Patient reports left sided CP that is sharp in nature. She also describes associated back pain Exam : awake/alert, no distress, no murmurs noted lungs are CTA-B Plan: will need d-dimer to r/o PE Pt otherwise well appearing     Ripley Fraise, MD 11/15/18 [phone]

## 2018-11-15 NOTE — ED Provider Notes (Signed)
[facility] EMERGENCY DEPARTMENT Provider Note   CSN: [id number] Arrival date & time: 11/14/18  2042     History   Chief Complaint Chief Complaint  Patient presents with  . Chest Pain    HPI Betty Avery is a 58 y.o. female.     Patient with past medical history notable for hyperlipidemia, hypertension, presents to the emergency department with a chief complaint of intermittent episodes of stabbing left-sided chest pain x1 month.  She states that her birthday is coming up, and she wanted to be evaluated so that she would know that she would make it to her birthday.  She denies any shortness of breath or diaphoresis.  She states that she has some pain in her left shoulder.  She describes the left sided chest pain as sharp and stabbing.  It is worsened with movement and palpation.  She denies any fever, chills, cough.  Denies any recent illnesses.  Denies any history of PE or DVT.  The history is provided by the patient. No language interpreter was used.    Past Medical History:  Diagnosis Date  . Chronic constipation   . Hypercholesteremia   . Hypertension   . S/P colonoscopic polypectomy 2013  . Wears glasses     Patient Active Problem List   Diagnosis Date Noted  . Alcoholism ([place]) 03/21/2018  . Hypertension 03/21/2018    Class: Chronic    Past Surgical History:  Procedure Laterality Date  . COLONOSCOPY     x2  . LEFT HEART CATHETERIZATION WITH CORONARY ANGIOGRAM N/A 02/18/2014   Procedure: LEFT HEART CATHETERIZATION WITH CORONARY ANGIOGRAM;  Surgeon: Clent Demark, MD;  Location: [place] CATH LAB;  Service: Cardiovascular;  Laterality: N/A;  . ORBITAL FRACTURE SURGERY  2001   rt eye-fx-plate   . ORIF ANKLE FRACTURE Left 12/20/2016   Procedure: OPEN REDUCTION INTERNAL FIXATION (ORIF) LEFT  ANKLE FRACTURE;  Surgeon: Renette Butters, MD;  Location: [place];  Service: Orthopedics;  Laterality: Left;  . TONSILLECTOMY       OB  History   No obstetric history on file.      Home Medications    Prior to Admission medications   Medication Sig Start Date End Date Taking? Authorizing Provider  amLODipine (NORVASC) 2.5 MG tablet Take 1 tablet (2.5 mg total) by mouth daily. 11/09/18   Kerin Perna, NP  aspirin EC 81 MG tablet Take 1 tablet (81 mg total) by mouth daily. 10/24/17   Clent Demark, PA-C  b complex vitamins tablet Take 1 tablet by mouth daily. 11/21/17   Clent Demark, PA-C  DULoxetine (CYMBALTA) 60 MG capsule Take 1 capsule (60 mg total) by mouth daily. 11/09/18   Kerin Perna, NP  folic acid (FOLVITE) 1 MG tablet Take 1 tablet (1 mg total) by mouth daily. 11/21/17   Clent Demark, PA-C  gemfibrozil (LOPID) 600 MG tablet Take 1 tablet (600 mg total) by mouth 2 (two) times daily. 11/10/18   Kerin Perna, NP  ibuprofen (ADVIL) 600 MG tablet Take 1 tablet (600 mg total) by mouth every 8 (eight) hours as needed for moderate pain. 11/09/18   Kerin Perna, NP  LORazepam (ATIVAN) 2 MG tablet Take one tablet every hour only if symptoms of delerium tremens arise. Do not take more than 5 tablets in one day. Patient not taking: Reported on 11/09/2018 11/21/17   Clent Demark, PA-C  meloxicam [facility]) 15 MG tablet Take  15 mg by mouth daily. 09/26/18   [provider]  metoprolol succinate (TOPROL-XL) 25 MG 24 hr tablet Take 0.5 tablets (12.5 mg total) by mouth daily. 11/09/18   Grayce Sessions, NP  naltrexone (DEPADE) 50 MG tablet Take 1 tablet (50 mg total) by mouth daily. 04/30/18   Grayce Sessions, NP  NITROSTAT 0.4 MG SL tablet Place 0.4 mg under the tongue every 5 (five) minutes x 3 doses as needed for chest pain.  02/13/14   [provider]  omeprazole (PRILOSEC) 20 MG capsule Take 20 mg by mouth daily. 09/26/18   [provider]  PROAIR HFA 108 (90 BASE) MCG/ACT inhaler Inhale 1-2 puffs into the lungs every 6 (six) hours as needed for wheezing or  shortness of breath.  09/10/14   [provider]  tiZANidine (ZANAFLEX) 4 MG tablet Take 5 mg by mouth 3 (three) times daily as needed. 09/26/18   [provider]    Family History No family history on file.  Social History Social History   Tobacco Use  . Smoking status: Never Smoker  . Smokeless tobacco: Never Used  Substance Use Topics  . Alcohol use: Yes    Alcohol/week: 14.0 standard drinks    Types: 14 Cans of beer per week    Comment: beer- alcohol level 330 when pt fell  . Drug use: No     Allergies   Latex   Review of Systems Review of Systems  All other systems reviewed and are negative.    Physical Exam Updated Vital Signs BP (!) 156/106 (BP Location: Left Arm)   Pulse 71   Temp 97.6 F (36.4 C) (Oral)   Resp 16   Ht 5\' 1"  (1.549 m)   Wt 53.5 kg   LMP 09/21/2012   SpO2 99%   BMI 22.30 kg/m   Physical Exam Vitals signs and nursing note reviewed.  Constitutional:      General: She is not in acute distress.    Appearance: She is well-developed.  HENT:     Head: Normocephalic and atraumatic.  Eyes:     Conjunctiva/sclera: Conjunctivae normal.  Neck:     Musculoskeletal: Neck supple.  Cardiovascular:     Rate and Rhythm: Normal rate and regular rhythm.     Heart sounds: No murmur.     Comments: Anterior left chest wall tenderness Pulmonary:     Effort: Pulmonary effort is normal. No respiratory distress.     Breath sounds: Normal breath sounds.  Abdominal:     Palpations: Abdomen is soft.     Tenderness: There is no abdominal tenderness.  Skin:    General: Skin is warm and dry.  Neurological:     Mental Status: She is alert and oriented to person, place, and time.  Psychiatric:        Mood and Affect: Mood normal.        Behavior: Behavior normal.      ED Treatments / Results  Labs (all labs ordered are listed, but only abnormal results are displayed) Labs Reviewed  BASIC METABOLIC PANEL - Abnormal; Notable for the  following components:      Result Value   Sodium 128 (*)    Chloride 94 (*)    All other components within normal limits  CBC - Abnormal; Notable for the following components:   HCT 34.9 (*)    All other components within normal limits  PROTIME-INR  D-DIMER, QUANTITATIVE (NOT AT [facility])  I-STAT BETA  HCG BLOOD, ED ([facility], [facility], [facility] ONLY)  TROPONIN I (HIGH SENSITIVITY)  TROPONIN I (HIGH SENSITIVITY)    EKG EKG Interpretation  Date/Time:  Wednesday November 14 2018 20:51:01 EDT Ventricular Rate:  65 PR Interval:  152 QRS Duration: 76 QT Interval:  390 QTC Calculation: 405 R Axis:   76 Text Interpretation:  Normal sinus rhythm Normal ECG No significant change since last tracing Confirmed by Zadie RhineWickline, Donald ([id number]) on 11/15/2018 4:57:19 AM   Radiology Dg Chest 2 View  Result Date: 11/14/2018 CLINICAL DATA:  58 year old female with chest pain. EXAM: CHEST - 2 VIEW COMPARISON:  Chest radiograph dated 12/07/2016 FINDINGS: The heart size and mediastinal contours are within normal limits. Both lungs are clear. The visualized skeletal structures are unremarkable. IMPRESSION: No active cardiopulmonary disease. Electronically Signed   By: Elgie CollardArash  Radparvar M.D.   On: 11/14/2018 21:31    Procedures Procedures (including critical care time)  Medications Ordered in ED Medications  sodium chloride flush (NS) 0.9 % injection 3 mL (has no administration in time range)  sodium chloride 0.9 % bolus 1,000 mL (has no administration in time range)     Initial Impression / Assessment and Plan / ED Course  I have reviewed the triage vital signs and the nursing notes.  Pertinent labs & imaging results that were available during my care of the patient were reviewed by me and considered in my medical decision making (see chart for details).         Patient with sharp/stabbing left sided chest pain that has been intermittent for the past month.  Denies any injuries.  No cough or fever. Hx of anxiety.   Patient had left heart cath in 2016, findings are copied below for convenience. FINDINGS:  LV showed good LV systolic function.  EF of 60-65%.  Left main was patent.  LAD was patent.  Diagonal 1 was moderate size, which was patent.  Diagonal 2 was very, very small which was patent.  Left circumflex was patent.  OM 1 was very, very small.  OM 2 was moderate sized, which was patent.  RCA was patent.  PLV and PDA branches were small, which were patent.  The patient tolerated the procedure well. There were no complications.  The patient was transferred to recovery room in stable condition.  Patient's initial troponin was 4, repeat troponin was 6.  No ischemic EKG findings.  Chest x-ray is negative.  Laboratory work-up today notable for sodium of 128, will give a liter of saline.    I have a low suspicion for ACS or PE in this patient.  Will check d-dimer.  D-dimer is negative.  Doubt PE.  Could have some intercostal muscle spasm, versus inflammatory process of the chest wall.  Recommend ice, NSAIDs, and will try some flexeril.  Recommend continued outpatient follow-up, return precautions given.  Patient seen by and discussed with Dr. Bebe ShaggyWickline, who agrees with the plan.  Final Clinical Impressions(s) / ED Diagnoses   Final diagnoses:  Nonspecific chest pain    ED Discharge Orders         Ordered    cyclobenzaprine (FLEXERIL) 10 MG tablet  2 times daily PRN     11/15/18 0616           Roxy HorsemanBrowning, Kristin Barcus, PA-C 11/15/18 [id number]    Zadie RhineWickline, Donald, MD 11/16/18 [phone]

## 2018-12-05 ENCOUNTER — Ambulatory Visit (INDEPENDENT_AMBULATORY_CARE_PROVIDER_SITE_OTHER): Payer: Medicaid Other | Admitting: Primary Care

## 2018-12-05 ENCOUNTER — Other Ambulatory Visit (HOSPITAL_COMMUNITY)
Admission: RE | Admit: 2018-12-05 | Discharge: 2018-12-05 | Disposition: A | Discharge status: Home / Self Care | Payer: Medicaid Other | Source: Ambulatory Visit | Attending: Primary Care | Admitting: Primary Care

## 2018-12-05 ENCOUNTER — Encounter (INDEPENDENT_AMBULATORY_CARE_PROVIDER_SITE_OTHER): Payer: Self-pay | Admitting: Primary Care

## 2018-12-05 ENCOUNTER — Other Ambulatory Visit: Payer: Self-pay

## 2018-12-05 VITALS — BP 142/93 | HR 97 | Temp 97.1°F | Ht 61.0 in | Wt 120.4 lb

## 2018-12-05 DIAGNOSIS — R630 Anorexia: Secondary | ICD-10-CM | POA: Diagnosis not present

## 2018-12-05 DIAGNOSIS — I10 Essential (primary) hypertension: Secondary | ICD-10-CM | POA: Diagnosis not present

## 2018-12-05 DIAGNOSIS — Z114 Encounter for screening for human immunodeficiency virus [HIV]: Secondary | ICD-10-CM

## 2018-12-05 DIAGNOSIS — Z01411 Encounter for gynecological examination (general) (routine) with abnormal findings: Secondary | ICD-10-CM | POA: Diagnosis not present

## 2018-12-05 DIAGNOSIS — F4321 Adjustment disorder with depressed mood: Secondary | ICD-10-CM | POA: Diagnosis not present

## 2018-12-05 DIAGNOSIS — Z124 Encounter for screening for malignant neoplasm of cervix: Secondary | ICD-10-CM

## 2018-12-05 MED ORDER — MULTI-VITAMIN/MINERALS PO TABS: 1.0000 | ORAL_TABLET | Freq: Every day | ORAL | 6 refills | Status: DC

## 2018-12-05 MED ORDER — MEGESTROL ACETATE 20 MG PO TABS: 20.0000 mg | ORAL_TABLET | Freq: Every day | ORAL | 2 refills | Status: DC

## 2018-12-05 NOTE — Patient Instructions (Signed)
SBE 1) In the Shower  With the pads/flats of your 3 middle fingers, check the entire breast and armpit area pressing down with light, medium, and firm pressure. Check both breasts each month feeling for any lump, thickening, hardened knot, or any other breast changes. 2) In Front of a Mirror  Visually inspect your breasts with your arms at your sides. Next, raise your arms high overhead. Look for any changes in the contour, any swelling, or dimpling of the skin, or changes in the nipples. Next, rest your palms on your hips and press firmly to flex your chest muscles. Left and right breasts will not exactly match-few women's breasts do, so look for any dimpling, puckering, or changes, particularly on one side. 3) Lying Down When lying down, the breast tissue spreads out evenly along the chest wall. Place a pillow under your right shoulder and your right arm behind your head. Using your left hand, move the pads of your fingers around your right breast gently covering the entire breast area and armpit. Use light, medium, and firm pressure. Squeeze the nipple; check for discharge and lumps. Repeat these steps for your left breast.

## 2018-12-05 NOTE — Progress Notes (Signed)
Established Patient Office Visit  Subjective:  Patient ID: Betty Avery, female    DOB: [birth year]  Age: 58 y.o. MRN: [id number]  CC:  Chief Complaint  Patient presents with  . Gynecologic Exam    HPI Betty Avery presents for well womans visit pap smear.  Past Medical History:  Diagnosis Date  . Chronic constipation   . Hypercholesteremia   . Hypertension   . S/P colonoscopic polypectomy 2013  . Wears glasses     Past Surgical History:  Procedure Laterality Date  . COLONOSCOPY     x2  . LEFT HEART CATHETERIZATION WITH CORONARY ANGIOGRAM N/A 02/18/2014   Procedure: LEFT HEART CATHETERIZATION WITH CORONARY ANGIOGRAM;  Surgeon: Clent Demark, MD;  Location: [place] CATH LAB;  Service: Cardiovascular;  Laterality: N/A;  . ORBITAL FRACTURE SURGERY  2001   rt eye-fx-plate   . ORIF ANKLE FRACTURE Left 12/20/2016   Procedure: OPEN REDUCTION INTERNAL FIXATION (ORIF) LEFT  ANKLE FRACTURE;  Surgeon: Renette Butters, MD;  Location: [place];  Service: Orthopedics;  Laterality: Left;  . TONSILLECTOMY      History reviewed. No pertinent family history.  Social History   Socioeconomic History  . Marital status: Single    Spouse name: Not on file  . Number of children: Not on file  . Years of education: Not on file  . Highest education level: Not on file  Occupational History  . Not on file  Social Needs  . Financial resource strain: Not on file  . Food insecurity    Worry: Not on file    Inability: Not on file  . Transportation needs    Medical: Not on file    Non-medical: Not on file  Tobacco Use  . Smoking status: Never Smoker  . Smokeless tobacco: Never Used  Substance and Sexual Activity  . Alcohol use: Yes    Alcohol/week: 14.0 standard drinks    Types: 14 Cans of beer per week    Comment: beer- alcohol level 330 when pt fell  . Drug use: No  . Sexual activity: Not on file    Comment: a few a day  Lifestyle  . Physical activity    Days  per week: Not on file    Minutes per session: Not on file  . Stress: Not on file  Relationships  . Social Herbalist on phone: Not on file    Gets together: Not on file    Attends religious service: Not on file    Active member of club or organization: Not on file    Attends meetings of clubs or organizations: Not on file    Relationship status: Not on file  . Intimate partner violence    Fear of current or ex partner: Not on file    Emotionally abused: Not on file    Physically abused: Not on file    Forced sexual activity: Not on file  Other Topics Concern  . Not on file  Social History Narrative  . Not on file    Outpatient Medications Prior to Visit  Medication Sig Dispense Refill  . amLODipine (NORVASC) 2.5 MG tablet Take 1 tablet (2.5 mg total) by mouth daily. 30 tablet 3  . aspirin EC 81 MG tablet Take 1 tablet (81 mg total) by mouth daily. 90 tablet 3  . cyclobenzaprine (FLEXERIL) 10 MG tablet Take 1 tablet (10 mg total) by mouth 2 (two) times daily as needed for  muscle spasms. 10 tablet 0  . folic acid (FOLVITE) 1 MG tablet Take 1 tablet (1 mg total) by mouth daily. 30 tablet 1  . gemfibrozil (LOPID) 600 MG tablet Take 1 tablet (600 mg total) by mouth 2 (two) times daily. 60 tablet 3  . ibuprofen (ADVIL) 600 MG tablet Take 1 tablet (600 mg total) by mouth every 8 (eight) hours as needed for moderate pain. 60 tablet 1  . meloxicam (MOBIC) 15 MG tablet Take 15 mg by mouth daily.    . metoprolol succinate (TOPROL-XL) 25 MG 24 hr tablet Take 0.5 tablets (12.5 mg total) by mouth daily. 30 tablet 3  . omeprazole (PRILOSEC) 20 MG capsule Take 20 mg by mouth daily.    Marland Kitchen. PROAIR HFA 108 (90 BASE) MCG/ACT inhaler Inhale 1-2 puffs into the lungs every 6 (six) hours as needed for wheezing or shortness of breath.   0  . tiZANidine (ZANAFLEX) 4 MG tablet Take 4 mg by mouth every 8 (eight) hours as needed for muscle spasms.     Marland Kitchen. b complex vitamins tablet Take 1 tablet by mouth  daily. 30 tablet 1  . DULoxetine (CYMBALTA) 60 MG capsule Take 1 capsule (60 mg total) by mouth daily. 90 capsule 3  . LORazepam (ATIVAN) 2 MG tablet Take one tablet every hour only if symptoms of delerium tremens arise. Do not take more than 5 tablets in one day. (Patient not taking: Reported on 11/09/2018) 15 tablet 0  . naltrexone (DEPADE) 50 MG tablet Take 1 tablet (50 mg total) by mouth daily. (Patient not taking: Reported on 12/05/2018) 30 tablet 2  . NITROSTAT 0.4 MG SL tablet Place 0.4 mg under the tongue every 5 (five) minutes as needed for chest pain.   0   No facility-administered medications prior to visit.     Allergies  Allergen Reactions  . Latex Itching    ROS Review of Systems  Psychiatric/Behavioral:       Depressed sister funeral yesterday  All other systems reviewed and are negative.     Objective:    Physical Exam  CONSTITUTIONAL: Well-developed, well-nourished female in no acute distress.  HENT:  Normocephalic, atraumatic, External right and left ear normal. Oropharynx is clear and moist EYES: Conjunctivae and EOM are normal. Pupils are equal, round, and reactive to light. No scleral icterus.  NECK: Normal range of motion, supple, no masses.  Normal thyroid.  SKIN: Skin is warm and dry. No rash noted. Not diaphoretic. No erythema. No pallor. NEUROLGIC: Alert and oriented to person, place, and time. Normal reflexes, muscle tone coordination. No cranial nerve deficit noted. PSYCHIATRIC: Normal mood and affect. Normal behavior. Normal judgment and thought content. CARDIOVASCULAR: Normal heart rate noted, regular rhythm RESPIRATORY: Clear to auscultation bilaterally. Effort and breath sounds normal, no problems with respiration noted. BREASTS: Symmetric in size. No masses, skin changes, nipple drainage, or lymphadenopathy.  taught SBE ABDOMEN: Soft, normal bowel sounds, no distention noted.  No tenderness, rebound or guarding.  PELVIC: Normal appearing external  genitalia; normal appearing vaginal mucosa and cervix.  No abnormal discharge noted.  Pap smear obtained.  Normal uterine size, no other palpable masses, no uterine or adnexal tenderness. MUSCULOSKELETAL: Normal range of motion. No tenderness.  No cyanosis, clubbing, or edema.  2+ distal pulses. BP (!) 142/93 (BP Location: Right Arm, Patient Position: Sitting, Cuff Size: Normal)   Pulse 97   Temp (!) 97.1 F (36.2 C) (Temporal)   Ht 5\' 1"  (1.549 m)   Wt  120 lb 6.4 oz (54.6 kg)   LMP 09/21/2012   SpO2 92%   BMI 22.75 kg/m  Wt Readings from Last 3 Encounters:  12/05/18 120 lb 6.4 oz (54.6 kg)  11/14/18 118 lb (53.5 kg)  11/09/18 118 lb 9.6 oz (53.8 kg)     Health Maintenance Due  Topic Date Due  . PAP SMEAR-Modifier  11/16/1982  . MAMMOGRAM  01/18/2018    There are no preventive care reminders to display for this patient.  Lab Results  Component Value Date   TSH 1.890 10/24/2017   Lab Results  Component Value Date   WBC 5.9 11/14/2018   HGB 12.0 11/14/2018   HCT 34.9 (L) 11/14/2018   MCV 87.3 11/14/2018   PLT 274 11/14/2018   Lab Results  Component Value Date   NA 128 (L) 11/14/2018   K 4.4 11/14/2018   CO2 22 11/14/2018   GLUCOSE 92 11/14/2018   BUN 14 11/14/2018   CREATININE 0.74 11/14/2018   BILITOT 0.7 11/09/2018   ALKPHOS 112 11/09/2018   AST 108 (H) 11/09/2018   ALT 60 (H) 11/09/2018   PROT 8.6 (H) 11/09/2018   ALBUMIN 5.5 (H) 11/09/2018   CALCIUM 10.0 11/14/2018   ANIONGAP 12 11/14/2018   Lab Results  Component Value Date   CHOL 259 (H) 11/09/2018   Lab Results  Component Value Date   HDL 167 11/09/2018   Lab Results  Component Value Date   LDLCALC 86 11/09/2018   Lab Results  Component Value Date   TRIG 37 11/09/2018   Lab Results  Component Value Date   CHOLHDL 1.6 11/09/2018   No results found for: HGBA1C    Assessment & Plan:  Betty Avery was seen today for gynecologic exam.  Diagnoses and all orders for this visit:  Cervical  cancer screening -     Cervicovaginal ancillary only -     Cytology - PAP([place]) -     HIV antibody (with reflex)  Hypertension, unspecified type Not at goal grieving she buried her sister yesterday. We discussed goal 130/80 and diet modification reduction of sodium, salty snacks and fermented food.  Encounter for screening for HIV screening for HIV in the future   Loss of appetite No desire to eat her significant other has to fuss at her to eat a little will start on Megace.   Grieving Loss of sister and desire or will at this time . No thought of harm to self or others   Other orders -     megestrol (MEGACE) 20 MG tablet; Take 1 tablet (20 mg total) by mouth daily. -     Multiple Vitamins-Minerals (MULTIVITAMIN WITH MINERALS) tablet; Take 1 tablet by mouth daily.   Follow-up: Return in about 2 weeks (around 12/19/2018) for Clinical social worker depression/grief loss of her sisiter.

## 2018-12-06 ENCOUNTER — Telehealth (INDEPENDENT_AMBULATORY_CARE_PROVIDER_SITE_OTHER): Payer: Self-pay

## 2018-12-06 LAB — HIV ANTIBODY (ROUTINE TESTING W REFLEX): HIV Screen 4th Generation wRfx: NONREACTIVE

## 2018-12-06 NOTE — Telephone Encounter (Signed)
Patient aware that HIV is negative. Nat Christen, CMA

## 2018-12-06 NOTE — Telephone Encounter (Signed)
-----   Message from Kerin Perna, NP sent at 12/06/2018  2:37 PM EDT ----- HIV test negative

## 2018-12-10 LAB — CYTOLOGY - PAP
Adequacy: ABSENT
Chlamydia: NEGATIVE
Comment: NEGATIVE
Comment: NEGATIVE
Comment: NEGATIVE
Comment: NORMAL
Diagnosis: NEGATIVE
HSV1: NEGATIVE
HSV2: NEGATIVE
Neisseria Gonorrhea: NEGATIVE
Trichomonas: POSITIVE — AB

## 2018-12-11 ENCOUNTER — Other Ambulatory Visit (INDEPENDENT_AMBULATORY_CARE_PROVIDER_SITE_OTHER): Payer: Self-pay | Admitting: Primary Care

## 2018-12-11 MED ORDER — METRONIDAZOLE 500 MG PO TABS: ORAL_TABLET | ORAL | 0 refills | Status: DC

## 2018-12-12 ENCOUNTER — Telehealth (INDEPENDENT_AMBULATORY_CARE_PROVIDER_SITE_OTHER): Payer: Self-pay

## 2018-12-12 NOTE — Telephone Encounter (Signed)
Patient is aware of positive trichomonas. She has already taken antibiotic. She knows that pap was normal otherwise. Nat Christen, CMA

## 2018-12-12 NOTE — Telephone Encounter (Signed)
-----   Message from Kerin Perna, NP sent at 12/11/2018  8:48 AM EDT ----- Trichomonas positive sent in flagyl

## 2018-12-13 LAB — CERVICOVAGINAL ANCILLARY ONLY
Bacterial Vaginitis (gardnerella): POSITIVE — AB
Candida Glabrata: NEGATIVE
Candida Vaginitis: NEGATIVE
Chlamydia: NEGATIVE
Comment: NEGATIVE
Comment: NEGATIVE
Comment: NEGATIVE
Comment: NEGATIVE
Comment: NEGATIVE
Comment: NORMAL
Neisseria Gonorrhea: NEGATIVE
Trichomonas: POSITIVE — AB

## 2018-12-14 ENCOUNTER — Other Ambulatory Visit (INDEPENDENT_AMBULATORY_CARE_PROVIDER_SITE_OTHER): Payer: Self-pay | Admitting: Primary Care

## 2018-12-14 MED ORDER — FLUCONAZOLE 150 MG PO TABS: 150.0000 mg | ORAL_TABLET | Freq: Once | ORAL | 0 refills | Status: AC

## 2018-12-17 ENCOUNTER — Encounter (INDEPENDENT_AMBULATORY_CARE_PROVIDER_SITE_OTHER): Payer: Self-pay | Admitting: Primary Care

## 2018-12-17 ENCOUNTER — Other Ambulatory Visit: Payer: Self-pay

## 2018-12-17 ENCOUNTER — Ambulatory Visit (INDEPENDENT_AMBULATORY_CARE_PROVIDER_SITE_OTHER): Payer: Medicaid Other | Admitting: Primary Care

## 2018-12-17 DIAGNOSIS — M8580 Other specified disorders of bone density and structure, unspecified site: Secondary | ICD-10-CM | POA: Diagnosis not present

## 2018-12-17 DIAGNOSIS — Z780 Asymptomatic menopausal state: Secondary | ICD-10-CM

## 2018-12-17 MED ORDER — ALENDRONATE SODIUM 70 MG PO TABS: 70.0000 mg | ORAL_TABLET | ORAL | 11 refills | Status: DC

## 2018-12-17 NOTE — Progress Notes (Signed)
Virtual Visit via Telephone Note  I connected with Betty Avery on 12/17/18 at 10:30 AM EST by telephone and verified that I am speaking with the correct person using two identifiers.   I discussed the limitations, risks, security and privacy concerns of performing an evaluation and management service by telephone and the availability of in person appointments. I also discussed with the patient that there may be a patient responsible charge related to this service. The patient expressed understanding and agreed to proceed.   History of Present Illness: Betty Avery is having a tele visit requesting results that was order from another physician she no longer goes to- Bone density results.  Past Medical History:  Diagnosis Date  . Chronic constipation   . Hypercholesteremia   . Hypertension   . S/P colonoscopic polypectomy 2013  . Wears glasses     Observations/Objective: Review of Systems  Musculoskeletal:       Pelvic and bilateral leg pain  All other systems reviewed and are negative.   Assessment and Plan: Betty Avery was seen today for results.  Diagnoses and all orders for this visit:  Osteopenia after menopause Explain to patient low bone mass and increase risks for fractures. Explained risk factors menopausal , smoking cigarette , alcohol consumption and female gender.  Other orders -     alendronate (FOSAMAX) 70 MG tablet; Take 1 tablet (70 mg total) by mouth every 7 (seven) days. Take with a full glass of water on an empty stomach.    Follow Up Instructions:    I discussed the assessment and treatment plan with the patient. The patient was provided an opportunity to ask questions and all were answered. The patient agreed with the plan and demonstrated an understanding of the instructions.   The patient was advised to call back or seek an in-person evaluation if the symptoms worsen or if the condition fails to improve as anticipated.  I provided 13  minutes of  non-face-to-face time during this encounter. This included reviewing and explaining bone density test.   Kerin Perna, NP

## 2018-12-18 ENCOUNTER — Ambulatory Visit (INDEPENDENT_AMBULATORY_CARE_PROVIDER_SITE_OTHER): Payer: Medicaid Other | Admitting: Licensed Clinical Social Worker

## 2018-12-18 DIAGNOSIS — F4320 Adjustment disorder, unspecified: Secondary | ICD-10-CM

## 2018-12-18 DIAGNOSIS — F4329 Adjustment disorder with other symptoms: Secondary | ICD-10-CM | POA: Diagnosis not present

## 2018-12-18 DIAGNOSIS — F439 Reaction to severe stress, unspecified: Secondary | ICD-10-CM

## 2018-12-18 DIAGNOSIS — F4321 Adjustment disorder with depressed mood: Secondary | ICD-10-CM | POA: Diagnosis not present

## 2018-12-21 NOTE — BH Specialist Note (Addendum)
Integrated Behavioral Health Initial Visit  MRN: [id number] Name: Betty Avery  Number of [place] Clinician visits:: 1/6 Session Start time: 9:50 AM  Session End time: 10:15 AM Total time: 25 minutes  Type of Service: [place] Interpretor:No. Interpretor Name and Language: NA   SUBJECTIVE: Betty Avery is a 58 y.o. female accompanied by self Patient was referred by NP Oletta Lamas for grief. Patient reports the following symptoms/concerns: Pt reports difficulty managing depression and anxiety symptoms, such as, decreased appetite, irritability, and feelings of sadness.  Duration of problem: "Long long time ago" Pt reports diagnosis of Bipolar Schizophrenia; Severity of problem: moderate  OBJECTIVE: Mood: Anxious and Pleasant and Affect: Appropriate Risk of harm to self or others: No plan to harm self or others Pt reports "rarely" having passive suicidal ideations with no plan or intent. Denies current SI/HI  LIFE CONTEXT: Family and Social: Pt reports receiving strong support from sister, Camillia Herter, and her four adult daughters and 9 grandchildren that she sees daily. Pt reports strained relationship with brother which causes feelings of sadness and anger, at times School/Work: Pt receives SSI ($700) and medicaid Self-Care: Pt takes medications and supplements to assist in management of medical and mental health conditions and decrease alcohol use. She identified self-care activities (receiving manicures and spending time with family) Life Changes: Pt has difficulty managing mental health conditions  GOALS ADDRESSED: Patient will: 1. Reduce symptoms of: agitation, anxiety and depression 2. Increase knowledge and/or ability of: coping skills and healthy habits  3. Demonstrate ability to: Increase healthy adjustment to current life circumstances and Increase adequate support systems for patient/family  INTERVENTIONS: Interventions  utilized: Supportive Counseling, Psychoeducation and/or Health Education and Link to [employer]  Standardized Assessments completed: GAD-7 and PHQ 2&9  ASSESSMENT: Patient currently experiencing depression and anxiety triggered by stress and grief. She reports hx of passive SI; however, denies current SI/HI. Pt receives strong support from family.   Patient may benefit from continued medication management. Therapeutic strategies were discussed to assist in management of symptoms and LCSW encouraged pt to continue utilizing healthy coping skills.   PLAN: 1. Follow up with behavioral health clinician on : LCSW will follow up with patient via phone in 2 weeks 2. Behavioral recommendations: Continue medication compliance and utilizing healthy coping skills 3. Referral(s): [place] (In Clinic) 4. "From scale of 1-10, how likely are you to follow plan?":   Rebekah Chesterfield, LCSW 12/21/2018 10:39 AM

## 2018-12-27 ENCOUNTER — Ambulatory Visit: Payer: Medicaid Other | Admitting: Licensed Clinical Social Worker

## 2019-01-01 ENCOUNTER — Other Ambulatory Visit: Payer: Self-pay

## 2019-01-01 ENCOUNTER — Ambulatory Visit (INDEPENDENT_AMBULATORY_CARE_PROVIDER_SITE_OTHER): Payer: Medicaid Other | Admitting: Licensed Clinical Social Worker

## 2019-01-01 DIAGNOSIS — F321 Major depressive disorder, single episode, moderate: Secondary | ICD-10-CM | POA: Diagnosis not present

## 2019-01-01 NOTE — BH Specialist Note (Signed)
Integrated Behavioral Health Visit via Telemedicine (Telephone)  01/01/2019 [name] [id number]   Session Start time: 9:05 AM  Session End time: 9:10 AM Total time: 5 mins  Referring Provider: NP Oletta Lamas Type of Visit: Telephonic Patient location: [facility] Provider location: Office All persons participating in visit: LCSW and Patient  Confirmed patient's address: No  Confirmed patient's phone number: No  Any changes to demographics: No   Confirmed patient's insurance: No  Any changes to patient's insurance: No   Discussed confidentiality: No    The following statements were read to the patient and/or legal guardian that are established with the [facility] Provider.  "The purpose of this phone visit is to provide behavioral health care while limiting exposure to the coronavirus (COVID19).  There is a possibility of technology failure and discussed alternative modes of communication if that failure occurs."  "By engaging in this telephone visit, you consent to the provision of healthcare.  Additionally, you authorize for your insurance to be billed for the services provided during this telephone visit."   Patient and/or legal guardian consented to telephone visit: Yes   PRESENTING CONCERNS: Patient and/or family reports the following symptoms/concerns: Pt shared that she has been "feeling better" and reports decrease in depression and anxiety symptoms from last week. She shared that she has been utilizing healthy coping skills (walking and visiting with friends) Duration of problem: Ongoing; Severity of problem: moderate  STRENGTHS (Protective Factors/Coping Skills): Pt is utilizing healthy coping skills Pt has a strong support system  GOALS ADDRESSED: Patient will: 1.  Reduce symptoms of: anxiety and depression  2.  Increase knowledge and/or ability of: coping skills  3.  Demonstrate ability to: Increase adequate support systems for  patient/family  INTERVENTIONS: Interventions utilized:  Solution-Focused Strategies Standardized Assessments completed: Not Needed  ASSESSMENT: Patient currently experiencing depression and anxiety.   Patient may benefit from continued use of healthy coping skills and brief therapy.  PLAN: 1. Follow up with behavioral health clinician on : LCSW will follow up with patient in 2 weeks. Pt encouraged to contact LCSW or PCP with any increase in symptoms 2. Behavioral recommendations: Continue utilizing healthy coping skills 3. Referral(s): [place] (In Clinic)  Rebekah Chesterfield, [place] 01/01/2019 9:32 AM

## 2019-02-26 ENCOUNTER — Ambulatory Visit (INDEPENDENT_AMBULATORY_CARE_PROVIDER_SITE_OTHER): Payer: Medicaid Other | Admitting: Primary Care

## 2019-03-12 ENCOUNTER — Other Ambulatory Visit: Payer: Self-pay

## 2019-03-12 ENCOUNTER — Ambulatory Visit (INDEPENDENT_AMBULATORY_CARE_PROVIDER_SITE_OTHER): Payer: Medicaid Other | Admitting: Primary Care

## 2019-03-22 ENCOUNTER — Other Ambulatory Visit (INDEPENDENT_AMBULATORY_CARE_PROVIDER_SITE_OTHER): Payer: Self-pay | Admitting: Primary Care

## 2019-04-18 ENCOUNTER — Ambulatory Visit: Payer: Medicaid Other | Attending: Internal Medicine

## 2019-04-18 DIAGNOSIS — Z20822 Contact with and (suspected) exposure to covid-19: Secondary | ICD-10-CM

## 2019-04-19 LAB — NOVEL CORONAVIRUS, NAA: SARS-CoV-2, NAA: NOT DETECTED

## 2019-04-21 ENCOUNTER — Telehealth: Payer: Self-pay

## 2019-04-21 NOTE — Telephone Encounter (Signed)
Called and informed patient that test for Covid 19 was NEGATIVE. Discussed signs and symptoms of Covid 19 : fever, chills, respiratory symptoms, cough, ENT symptoms, sore throat, SOB, muscle pain, diarrhea, headache, loss of taste/smell, close exposure to COVID-19 patient. Pt instructed to call PCP if they develop the above signs and sx. Pt also instructed to call 911 if having respiratory issues/distress. . Pt verbalized understanding. Spoke with Gwen Pounds.

## 2019-05-22 ENCOUNTER — Encounter (INDEPENDENT_AMBULATORY_CARE_PROVIDER_SITE_OTHER): Payer: Self-pay | Admitting: Primary Care

## 2019-05-22 ENCOUNTER — Ambulatory Visit (INDEPENDENT_AMBULATORY_CARE_PROVIDER_SITE_OTHER): Payer: Medicaid Other | Admitting: Primary Care

## 2019-05-22 ENCOUNTER — Other Ambulatory Visit: Payer: Self-pay

## 2019-05-22 DIAGNOSIS — M25512 Pain in left shoulder: Secondary | ICD-10-CM

## 2019-05-22 DIAGNOSIS — R197 Diarrhea, unspecified: Secondary | ICD-10-CM

## 2019-05-22 MED ORDER — DICLOFENAC SODIUM 1 % EX GEL: 4.0000 g | Freq: Four times a day (QID) | CUTANEOUS | 1 refills | Status: DC

## 2019-05-22 MED ORDER — DICYCLOMINE HCL 10 MG PO CAPS: 10.0000 mg | ORAL_CAPSULE | Freq: Three times a day (TID) | ORAL | 0 refills | Status: DC

## 2019-05-22 NOTE — Progress Notes (Signed)
Virtual Visit via Telephone Note  I connected with Betty Avery on 05/22/19 at  3:50 PM EDT by telephone and verified that I am speaking with the correct person using two identifiers.   I discussed the limitations, risks, security and privacy concerns of performing an evaluation and management service by telephone and the availability of in person appointments. I also discussed with the patient that there may be a patient responsible charge related to this service. The patient expressed understanding and agreed to proceed.   History of Present Illness: Betty Avery is a 59 year old African American female having tele visit because she has developed bowel incontinence over 3 months ago when she burps, cough, or sneeze    Observations/Objective: Review of Systems  Gastrointestinal: Positive for diarrhea.       2-3 months when she sneeze or cough   Musculoskeletal:       Body aches all over.  Left shoulders hurts 2-3 months    Past Medical History:  Diagnosis Date  . Chronic constipation   . Hypercholesteremia   . Hypertension   . S/P colonoscopic polypectomy 2013  . Wears glasses    Current Outpatient Medications on File Prior to Visit  Medication Sig Dispense Refill  . alendronate (FOSAMAX) 70 MG tablet Take 1 tablet (70 mg total) by mouth every 7 (seven) days. Take with a full glass of water on an empty stomach. 4 tablet 11  . aspirin EC 81 MG tablet Take 1 tablet (81 mg total) by mouth daily. 90 tablet 3  . b complex vitamins tablet Take 1 tablet by mouth daily. 30 tablet 1  . cyclobenzaprine (FLEXERIL) 10 MG tablet Take 1 tablet (10 mg total) by mouth 2 (two) times daily as needed for muscle spasms. 10 tablet 0  . DULoxetine (CYMBALTA) 60 MG capsule Take 1 capsule (60 mg total) by mouth daily. 90 capsule 3  . folic acid (FOLVITE) 1 MG tablet Take 1 tablet (1 mg total) by mouth daily. 30 tablet 1  . gemfibrozil (LOPID) 600 MG tablet Take 1 tablet (600 mg total) by mouth 2  (two) times daily. 60 tablet 3  . ibuprofen (ADVIL) 600 MG tablet Take 1 tablet (600 mg total) by mouth every 8 (eight) hours as needed for moderate pain. 60 tablet 1  . LORazepam (ATIVAN) 2 MG tablet Take one tablet every hour only if symptoms of delerium tremens arise. Do not take more than 5 tablets in one day. 15 tablet 0  . megestrol (MEGACE) 20 MG tablet TAKE 1 TABLET(20 MG) BY MOUTH DAILY (Patient not taking: Reported on 05/22/2019) 30 tablet 2  . meloxicam (MOBIC) 15 MG tablet Take 15 mg by mouth daily.    . Multiple Vitamins-Minerals (MULTIVITAMIN WITH MINERALS) tablet Take 1 tablet by mouth daily. (Patient not taking: Reported on 05/22/2019) 30 tablet 6  . naltrexone (DEPADE) 50 MG tablet Take 1 tablet (50 mg total) by mouth daily. (Patient not taking: Reported on 05/22/2019) 30 tablet 2  . NITROSTAT 0.4 MG SL tablet Place 0.4 mg under the tongue every 5 (five) minutes as needed for chest pain.   0  . omeprazole (PRILOSEC) 20 MG capsule Take 20 mg by mouth daily.    Marland Kitchen PROAIR HFA 108 (90 BASE) MCG/ACT inhaler Inhale 1-2 puffs into the lungs every 6 (six) hours as needed for wheezing or shortness of breath.   0  . tiZANidine (ZANAFLEX) 4 MG tablet Take 4 mg by mouth every  8 (eight) hours as needed for muscle spasms.      No current facility-administered medications on file prior to visit.   Assessment and Plan: Betty Avery was seen today for can not belch, diarrhea, shoulder pain and ankle pain.  Diagnoses and all orders for this visit:  Pain in joint of left shoulder You may try  alternate with heat and ice application for pain relief.  Prescribed pain relief Voltaren gel. Other alternatives include massage, acupuncture and water aerobics.  You must stay active and avoid a sedentary lifestyle.  Diarrhea, unspecified type Unknown etiology does not remember eating out anywhere or need foods. Encourage water [employer] and prescribe medication for diarrhea. If no improvement call the office for a  follow up appointment Other orders -     dicyclomine (BENTYL) 10 MG capsule; Take 1 capsule (10 mg total) by mouth 4 (four) times daily -  before meals and at bedtime. -     diclofenac Sodium (VOLTAREN) 1 % GEL; Apply 4 g topically 4 (four) times daily.    Follow Up Instructions:    I discussed the assessment and treatment plan with the patient. The patient was provided an opportunity to ask questions and all were answered. The patient agreed with the plan and demonstrated an understanding of the instructions.   The patient was advised to call back or seek an in-person evaluation if the symptoms worsen or if the condition fails to improve as anticipated.  I provided 10 minutes of non-face-to-face time during this encounter.   Grayce Sessions, NP

## 2019-05-22 NOTE — Progress Notes (Signed)
Pt states anytime she coughs something comes put her back side

## 2019-05-28 ENCOUNTER — Other Ambulatory Visit (INDEPENDENT_AMBULATORY_CARE_PROVIDER_SITE_OTHER): Payer: Self-pay | Admitting: Primary Care

## 2019-05-28 DIAGNOSIS — F1020 Alcohol dependence, uncomplicated: Secondary | ICD-10-CM

## 2019-05-28 DIAGNOSIS — E782 Mixed hyperlipidemia: Secondary | ICD-10-CM

## 2019-05-28 DIAGNOSIS — I10 Essential (primary) hypertension: Secondary | ICD-10-CM

## 2019-05-28 NOTE — Progress Notes (Signed)
Virtual Visit via Telephone Note  I connected with Betty Avery on 05/28/19 at  by telephone and verified that I am speaking with the correct person using two identifiers.   I discussed the limitations, risks, security and privacy concerns of performing an evaluation and management service by telephone and the availability of in person appointments. I also discussed with the patient that there may be a patient responsible charge related to this service. The patient expressed understanding and agreed to proceed.   History of Present Illness: Ms.Betty Avery is a 59 year old female she is having a tele visit for blood pressure follow up. She has not been checking her blood pressures and she d enies shortness of breath, headaches, chest pain or lower extremity edema Past Medical History:  Diagnosis Date  . Chronic constipation   . Hypercholesteremia   . Hypertension   . S/P colonoscopic polypectomy 2013  . Wears glasses    Current Outpatient Medications on File Prior to Visit  Medication Sig Dispense Refill  . alendronate (FOSAMAX) 70 MG tablet Take 1 tablet (70 mg total) by mouth every 7 (seven) days. Take with a full glass of water on an empty stomach. 4 tablet 11  . aspirin EC 81 MG tablet Take 1 tablet (81 mg total) by mouth daily. 90 tablet 3  . b complex vitamins tablet Take 1 tablet by mouth daily. 30 tablet 1  . cyclobenzaprine (FLEXERIL) 10 MG tablet Take 1 tablet (10 mg total) by mouth 2 (two) times daily as needed for muscle spasms. 10 tablet 0  . diclofenac Sodium (VOLTAREN) 1 % GEL Apply 4 g topically 4 (four) times daily. 100 g 1  . dicyclomine (BENTYL) 10 MG capsule Take 1 capsule (10 mg total) by mouth 4 (four) times daily -  before meals and at bedtime. 120 capsule 0  . DULoxetine (CYMBALTA) 60 MG capsule Take 1 capsule (60 mg total) by mouth daily. 90 capsule 3  . folic acid (FOLVITE) 1 MG tablet Take 1 tablet (1 mg total) by mouth daily. 30 tablet 1  . gemfibrozil  (LOPID) 600 MG tablet Take 1 tablet (600 mg total) by mouth 2 (two) times daily. 60 tablet 3  . ibuprofen (ADVIL) 600 MG tablet Take 1 tablet (600 mg total) by mouth every 8 (eight) hours as needed for moderate pain. 60 tablet 1  . LORazepam (ATIVAN) 2 MG tablet Take one tablet every hour only if symptoms of delerium tremens arise. Do not take more than 5 tablets in one day. 15 tablet 0  . megestrol (MEGACE) 20 MG tablet TAKE 1 TABLET(20 MG) BY MOUTH DAILY (Patient not taking: Reported on 05/22/2019) 30 tablet 2  . meloxicam (MOBIC) 15 MG tablet Take 15 mg by mouth daily.    . Multiple Vitamins-Minerals (MULTIVITAMIN WITH MINERALS) tablet Take 1 tablet by mouth daily. (Patient not taking: Reported on 05/22/2019) 30 tablet 6  . NITROSTAT 0.4 MG SL tablet Place 0.4 mg under the tongue every 5 (five) minutes as needed for chest pain.   0  . omeprazole (PRILOSEC) 20 MG capsule Take 20 mg by mouth daily.    Marland Kitchen PROAIR HFA 108 (90 BASE) MCG/ACT inhaler Inhale 1-2 puffs into the lungs every 6 (six) hours as needed for wheezing or shortness of breath.   0  . tiZANidine (ZANAFLEX) 4 MG tablet Take 4 mg by mouth every 8 (eight) hours as needed for muscle spasms.      No current facility-administered  medications on file prior to visit.    Observations/Objective: Review of Systems  Musculoskeletal: Positive for joint pain and myalgias.       Right shoulder pain  All other systems reviewed and are negative.  Assessment and Plan: Diagnoses and all orders for this visit:  Essential hypertension Counseled on blood pressure goal of less than 130/80, low-sodium, DASH diet, medication compliance, 150 minutes of moderate intensity exercise per week. Continue medication amlodipine 2.61m daily -     CMP14+EGFR; Future  Mixed hyperlipidemia Currently on no medication . She has agreed to return for labs to determine if medication is needed. -     Lipid panel; Future  Al[place]ism [place] discussed  at each encounter promises she is trying and doing better. -     CMP14+EGFR; Future -     CBC with Differential/Platelet; Future -     Ethanol; Future    Follow Up Instructions:    I discussed the assessment and treatment plan with the patient. The patient was provided an opportunity to ask questions and all were answered. The patient agreed with the plan and demonstrated an understanding of the instructions.   The patient was advised to call back or seek an in-person evaluation if the symptoms worsen or if the condition fails to improve as anticipated.  I provided 113mutes of non-face-to-face time during this encounter.   MiKerin PernaNP

## 2019-05-29 ENCOUNTER — Other Ambulatory Visit (INDEPENDENT_AMBULATORY_CARE_PROVIDER_SITE_OTHER): Payer: Medicaid Other

## 2019-05-29 ENCOUNTER — Other Ambulatory Visit: Payer: Self-pay

## 2019-05-29 ENCOUNTER — Ambulatory Visit (INDEPENDENT_AMBULATORY_CARE_PROVIDER_SITE_OTHER): Payer: Medicaid Other | Admitting: Primary Care

## 2019-05-29 ENCOUNTER — Encounter (INDEPENDENT_AMBULATORY_CARE_PROVIDER_SITE_OTHER): Payer: Self-pay | Admitting: Primary Care

## 2019-05-29 VITALS — BP 138/81 | HR 78 | Temp 97.3°F | Ht 61.0 in | Wt 116.8 lb

## 2019-05-29 DIAGNOSIS — Z1239 Encounter for other screening for malignant neoplasm of breast: Secondary | ICD-10-CM

## 2019-05-29 DIAGNOSIS — I10 Essential (primary) hypertension: Secondary | ICD-10-CM | POA: Diagnosis not present

## 2019-05-29 DIAGNOSIS — E782 Mixed hyperlipidemia: Secondary | ICD-10-CM

## 2019-05-29 DIAGNOSIS — Z9181 History of falling: Secondary | ICD-10-CM

## 2019-05-29 DIAGNOSIS — F1020 Alcohol dependence, uncomplicated: Secondary | ICD-10-CM

## 2019-05-29 DIAGNOSIS — F321 Major depressive disorder, single episode, moderate: Secondary | ICD-10-CM | POA: Insufficient documentation

## 2019-05-29 DIAGNOSIS — R296 Repeated falls: Secondary | ICD-10-CM

## 2019-05-29 DIAGNOSIS — Z124 Encounter for screening for malignant neoplasm of cervix: Secondary | ICD-10-CM

## 2019-05-29 MED ORDER — AMLODIPINE BESYLATE 2.5 MG PO TABS: 2.5000 mg | ORAL_TABLET | Freq: Every day | ORAL | 3 refills | Status: DC

## 2019-05-29 NOTE — Patient Instructions (Signed)

## 2019-05-29 NOTE — Progress Notes (Signed)
Acute Office Visit  Subjective:    Patient ID: Betty Avery, female    DOB: 04-19-1960, 59 y.o.   MRN: [id number]  Chief Complaint  Patient presents with  . Hip Pain    right. bruised     HPI [name] is in today for an acute visit she has a history of alcoholic abuse presents with a large bruise on her right  Hip and it is very painfuls. Admits to eating tylenol like candy. Explain unable to treat with any pain medication with known history of alcoholism. Recommended conservative measures. Concern if she fell while inebriated. She states she may of had A beer.   Past Medical History:  Diagnosis Date  . Chronic constipation   . Hypercholesteremia   . Hypertension   . S/P colonoscopic polypectomy 2013  . Wears glasses     Past Surgical History:  Procedure Laterality Date  . COLONOSCOPY     x2  . LEFT HEART CATHETERIZATION WITH CORONARY ANGIOGRAM N/A 02/18/2014   Procedure: LEFT HEART CATHETERIZATION WITH CORONARY ANGIOGRAM;  Surgeon: Robynn Pane, MD;  Location: [facility];  Service: Cardiovascular;  Laterality: N/A;  . ORBITAL FRACTURE SURGERY  2001   rt eye-fx-plate   . ORIF ANKLE FRACTURE Left 12/20/2016   Procedure: OPEN REDUCTION INTERNAL FIXATION (ORIF) LEFT  ANKLE FRACTURE;  Surgeon: Sheral Apley, MD;  Location: [facility];  Service: Orthopedics;  Laterality: Left;  . TONSILLECTOMY      History reviewed. No pertinent family history.  Social History   Socioeconomic History  . Marital status: Single    Spouse name: Not on file  . Number of children: Not on file  . Years of education: Not on file  . Highest education level: Not on file  Occupational History  . Not on file  Tobacco Use  . Smoking status: Never Smoker  . Smokeless tobacco: Never Used  Substance and Sexual Activity  . Alcohol use: Yes    Alcohol/week: 14.0 standard drinks    Types: 14 Cans of beer per week    Comment: beer- alcohol level 330 when pt fell  .  Drug use: No  . Sexual activity: Not on file    Comment: a few a day  Other Topics Concern  . Not on file  Social History Narrative  . Not on file   Social Determinants of Health   Financial Resource Strain:   . Difficulty of Paying Living Expenses:   Food Insecurity:   . Worried About Programme researcher, broadcasting/film/video in the Last Year:   . Barista in the Last Year:   Transportation Needs:   . Freight forwarder (Medical):   Marland Kitchen Lack of Transportation (Non-Medical):   Physical Activity:   . Days of Exercise per Week:   . Minutes of Exercise per Session:   Stress:   . Feeling of Stress :   Social Connections:   . Frequency of Communication with Friends and Family:   . Frequency of Social Gatherings with Friends and Family:   . Attends Religious Services:   . Active Member of Clubs or Organizations:   . Attends Banker Meetings:   Marland Kitchen Marital Status:   Intimate Partner Violence:   . Fear of Current or Ex-Partner:   . Emotionally Abused:   Marland Kitchen Physically Abused:   . Sexually Abused:     Outpatient Medications Prior to Visit  Medication Sig Dispense Refill  .  alendronate (FOSAMAX) 70 MG tablet Take 1 tablet (70 mg total) by mouth every 7 (seven) days. Take with a full glass of water on an empty stomach. 4 tablet 11  . aspirin EC 81 MG tablet Take 1 tablet (81 mg total) by mouth daily. 90 tablet 3  . b complex vitamins tablet Take 1 tablet by mouth daily. 30 tablet 1  . cyclobenzaprine (FLEXERIL) 10 MG tablet Take 1 tablet (10 mg total) by mouth 2 (two) times daily as needed for muscle spasms. 10 tablet 0  . diclofenac Sodium (VOLTAREN) 1 % GEL Apply 4 g topically 4 (four) times daily. 100 g 1  . dicyclomine (BENTYL) 10 MG capsule Take 1 capsule (10 mg total) by mouth 4 (four) times daily -  before meals and at bedtime. 120 capsule 0  . DULoxetine (CYMBALTA) 60 MG capsule Take 1 capsule (60 mg total) by mouth daily. 90 capsule 3  . folic acid (FOLVITE) 1 MG tablet Take 1  tablet (1 mg total) by mouth daily. 30 tablet 1  . gemfibrozil (LOPID) 600 MG tablet Take 1 tablet (600 mg total) by mouth 2 (two) times daily. 60 tablet 3  . ibuprofen (ADVIL) 600 MG tablet Take 1 tablet (600 mg total) by mouth every 8 (eight) hours as needed for moderate pain. 60 tablet 1  . LORazepam (ATIVAN) 2 MG tablet Take one tablet every hour only if symptoms of delerium tremens arise. Do not take more than 5 tablets in one day. 15 tablet 0  . meloxicam (MOBIC) 15 MG tablet Take 15 mg by mouth daily.    Marland Kitchen NITROSTAT 0.4 MG SL tablet Place 0.4 mg under the tongue every 5 (five) minutes as needed for chest pain.   0  . omeprazole (PRILOSEC) 20 MG capsule Take 20 mg by mouth daily.    Marland Kitchen PROAIR HFA 108 (90 BASE) MCG/ACT inhaler Inhale 1-2 puffs into the lungs every 6 (six) hours as needed for wheezing or shortness of breath.   0  . tiZANidine (ZANAFLEX) 4 MG tablet Take 4 mg by mouth every 8 (eight) hours as needed for muscle spasms.     Marland Kitchen amLODipine (NORVASC) 2.5 MG tablet Take 1 tablet (2.5 mg total) by mouth daily. 30 tablet 3  . megestrol (MEGACE) 20 MG tablet TAKE 1 TABLET(20 MG) BY MOUTH DAILY (Patient not taking: Reported on 05/22/2019) 30 tablet 2  . Multiple Vitamins-Minerals (MULTIVITAMIN WITH MINERALS) tablet Take 1 tablet by mouth daily. (Patient not taking: Reported on 05/22/2019) 30 tablet 6  . naltrexone (DEPADE) 50 MG tablet Take 1 tablet (50 mg total) by mouth daily. (Patient not taking: Reported on 05/22/2019) 30 tablet 2  . metoprolol succinate (TOPROL-XL) 25 MG 24 hr tablet Take 0.5 tablets (12.5 mg total) by mouth daily. (Patient not taking: Reported on 05/22/2019) 30 tablet 3   No facility-administered medications prior to visit.    Allergies  Allergen Reactions  . Latex Itching    Review of Systems  HENT:       Chip front tooth with fall   Musculoskeletal:       Bruise  All other systems reviewed and are negative.      Objective:    Physical Exam Vitals reviewed.    HENT:     Head: Normocephalic.     Mouth/Throat:     Comments: Chip front tooth  Cardiovascular:     Rate and Rhythm: Normal rate and regular rhythm.  Pulmonary:  Effort: Pulmonary effort is normal.     Breath sounds: Normal breath sounds.  Abdominal:     General: Bowel sounds are normal.  Musculoskeletal:        General: Normal range of motion.     Cervical back: Normal range of motion.  Skin:    Findings: Bruising present.  Neurological:     Mental Status: She is alert.  Psychiatric:        Mood and Affect: Mood normal.        Behavior: Behavior normal.        Thought Content: Thought content normal.        Judgment: Judgment normal.     BP 138/81 (BP Location: Left Arm, Patient Position: Sitting, Cuff Size: Small)   Pulse 78   Temp (!) 97.3 F (36.3 C) (Temporal)   Ht 5\' 1"  (1.549 m)   Wt 116 lb 12.8 oz (53 kg)   LMP 09/21/2012   SpO2 100%   BMI 22.07 kg/m  Wt Readings from Last 3 Encounters:  05/29/19 116 lb 12.8 oz (53 kg)  12/05/18 120 lb 6.4 oz (54.6 kg)  11/14/18 118 lb (53.5 kg)    Health Maintenance Due  Topic Date Due  . MAMMOGRAM  01/18/2018    There are no preventive care reminders to display for this patient.   Lab Results  Component Value Date   TSH 1.890 10/24/2017   Lab Results  Component Value Date   WBC 5.9 11/14/2018   HGB 12.0 11/14/2018   HCT 34.9 (L) 11/14/2018   MCV 87.3 11/14/2018   PLT 274 11/14/2018   Lab Results  Component Value Date   NA 128 (L) 11/14/2018   K 4.4 11/14/2018   CO2 22 11/14/2018   GLUCOSE 92 11/14/2018   BUN 14 11/14/2018   CREATININE 0.74 11/14/2018   BILITOT 0.7 11/09/2018   ALKPHOS 112 11/09/2018   AST 108 (H) 11/09/2018   ALT 60 (H) 11/09/2018   PROT 8.6 (H) 11/09/2018   ALBUMIN 5.5 (H) 11/09/2018   CALCIUM 10.0 11/14/2018   ANIONGAP 12 11/14/2018   Lab Results  Component Value Date   CHOL 259 (H) 11/09/2018   Lab Results  Component Value Date   HDL 167 11/09/2018   Lab Results   Component Value Date   LDLCALC 86 11/09/2018   Lab Results  Component Value Date   TRIG 37 11/09/2018   Lab Results  Component Value Date   CHOLHDL 1.6 11/09/2018   No results found for: HGBA1C     Assessment & Plan:   Betty Avery was seen today for hip pain.  Diagnoses and all orders for this visit:  Status post fall   Alcohol daily and ASA  Blood thinners unable to prescribe pain medication conservative measures ICE to area .   Encounter for breast cancer screening using non-mammogram modality Recommended yearly at the age of 65. -     MM Digital Diagnostic Bilat; Future  Alcoholism (HCC)  reports current alcohol use of about 14.0 standard drinks of alcohol per week. Today her first beer was at 10 AM. Discussed needing to stopped increased risk for fractures with osteoporosis falls  Essential hypertension Counseled on blood pressure goal of less than 130/80, low-sodium, DASH diet, medication compliance, 150 minutes of moderate intensity exercise per week. Discussed medication compliance, adverse effects.   Meds ordered this encounter  Medications  . amLODipine (NORVASC) 2.5 MG tablet    Sig: Take 1 tablet (2.5 mg total)  by mouth daily.    Dispense:  30 tablet    Refill:  3     Kerin Perna, NP

## 2019-05-31 LAB — CMP14+EGFR
ALT: 81 IU/L — ABNORMAL HIGH (ref 0–32)
AST: 128 IU/L — ABNORMAL HIGH (ref 0–40)
Albumin/Globulin Ratio: 2 (ref 1.2–2.2)
Albumin: 4.8 g/dL (ref 3.8–4.9)
Alkaline Phosphatase: 52 IU/L (ref 39–117)
BUN/Creatinine Ratio: 10 (ref 9–23)
BUN: 6 mg/dL (ref 6–24)
Bilirubin Total: 0.8 mg/dL (ref 0.0–1.2)
CO2: 19 mmol/L — ABNORMAL LOW (ref 20–29)
Calcium: 9.7 mg/dL (ref 8.7–10.2)
Chloride: 99 mmol/L (ref 96–106)
Creatinine, Ser: 0.61 mg/dL (ref 0.57–1.00)
GFR calc Af Amer: 116 mL/min/{1.73_m2} (ref >59)
GFR calc non Af Amer: 101 mL/min/{1.73_m2} (ref >59)
Globulin, Total: 2.4 g/dL (ref 1.5–4.5)
Glucose: 81 mg/dL (ref 65–99)
Potassium: 5.2 mmol/L (ref 3.5–5.2)
Sodium: 135 mmol/L (ref 134–144)
Total Protein: 7.2 g/dL (ref 6.0–8.5)

## 2019-05-31 LAB — CBC WITH DIFFERENTIAL/PLATELET
Basophils Absolute: 0 10*3/uL (ref 0.0–0.2)
Basos: 1 %
EOS (ABSOLUTE): 0.2 10*3/uL (ref 0.0–0.4)
Eos: 4 %
Hematocrit: 35.7 % (ref 34.0–46.6)
Hemoglobin: 11.4 g/dL (ref 11.1–15.9)
Immature Grans (Abs): 0 10*3/uL (ref 0.0–0.1)
Immature Granulocytes: 0 %
Lymphocytes Absolute: 1.4 10*3/uL (ref 0.7–3.1)
Lymphs: 32 %
MCH: 29.6 pg (ref 26.6–33.0)
MCHC: 31.9 g/dL (ref 31.5–35.7)
MCV: 93 fL (ref 79–97)
Monocytes Absolute: 0.4 10*3/uL (ref 0.1–0.9)
Monocytes: 9 %
Neutrophils Absolute: 2.4 10*3/uL (ref 1.4–7.0)
Neutrophils: 54 %
Platelets: 282 10*3/uL (ref 150–450)
RBC: 3.85 x10E6/uL (ref 3.77–5.28)
RDW: 12.3 % (ref 11.7–15.4)
WBC: 4.3 10*3/uL (ref 3.4–10.8)

## 2019-05-31 LAB — LIPID PANEL
Chol/HDL Ratio: 1.4 ratio (ref 0.0–4.4)
Cholesterol, Total: 196 mg/dL (ref 100–199)
HDL: 142 mg/dL (ref >39)
LDL Chol Calc (NIH): 46 mg/dL (ref 0–99)
Triglycerides: 36 mg/dL (ref 0–149)
VLDL Cholesterol Cal: 8 mg/dL (ref 5–40)

## 2019-05-31 LAB — ETHANOL: Ethanol: <0.01 %

## 2019-05-31 LAB — HIV ANTIBODY (ROUTINE TESTING W REFLEX): HIV Screen 4th Generation wRfx: NONREACTIVE

## 2019-06-06 ENCOUNTER — Telehealth (INDEPENDENT_AMBULATORY_CARE_PROVIDER_SITE_OTHER): Payer: Self-pay

## 2019-06-06 NOTE — Telephone Encounter (Signed)
-----   Message from Grayce Sessions, NP sent at 06/04/2019  5:25 PM EDT ----- Labs are normal except liver enzyme elevated from alcohol abuse HIV negative

## 2019-06-06 NOTE — Telephone Encounter (Signed)
Patient is aware that HIV is negative. Labs normal except elevated liver enzymes due to alcohol abuse. She verbalized understanding. Maryjean Morn, CMA

## 2019-06-19 ENCOUNTER — Telehealth (INDEPENDENT_AMBULATORY_CARE_PROVIDER_SITE_OTHER): Payer: Self-pay

## 2019-06-19 NOTE — Telephone Encounter (Signed)
Pharmacy sent over fax requesting refill of Naltrexone. Patient also presented to office with bottle requesting refill. Bottle states last fill was 03/2019. Please refill if appropriate. Send to [facility] on [address]. Maryjean Morn, CMA

## 2019-06-20 NOTE — Telephone Encounter (Signed)
Dose: 50 mg Route: Oral Frequency: Daily  Dispense Quantity: 30 tablet Refills: 2        Sig: Take 1 tablet (50 mg total) by mouth daily.  Patient not taking: Reported on 05/22/2019       Start Date: 04/30/18 End Date: --  Written Date: 04/30/18 Expiration Date: 04/30/19    This can be addressed by her PCP. She is not due for refills for another week or so.

## 2019-06-20 NOTE — Telephone Encounter (Signed)
Patient is aware to discuss refill with PCP when she returns since she is not due for refill for another week or so. She verbalized understanding.

## 2019-07-01 ENCOUNTER — Telehealth (INDEPENDENT_AMBULATORY_CARE_PROVIDER_SITE_OTHER): Payer: Self-pay

## 2019-07-01 NOTE — Telephone Encounter (Signed)
Patient came into clinic requesting a refill for naltrexone (DEPADE) 50 MG tablet  Patient states she is completely out of medication.  Patient uses [facility] on [address]   Please advice [phone]

## 2019-07-01 NOTE — Telephone Encounter (Signed)
Sent to PCP ?

## 2019-07-02 ENCOUNTER — Other Ambulatory Visit (INDEPENDENT_AMBULATORY_CARE_PROVIDER_SITE_OTHER): Payer: Self-pay | Admitting: Primary Care

## 2019-07-02 MED ORDER — NALTREXONE HCL 50 MG PO TABS: 50.0000 mg | ORAL_TABLET | Freq: Every day | ORAL | 2 refills | Status: DC

## 2019-08-12 ENCOUNTER — Ambulatory Visit (INDEPENDENT_AMBULATORY_CARE_PROVIDER_SITE_OTHER): Payer: Medicaid Other | Admitting: Primary Care

## 2019-10-02 ENCOUNTER — Other Ambulatory Visit (INDEPENDENT_AMBULATORY_CARE_PROVIDER_SITE_OTHER): Payer: Self-pay | Admitting: Primary Care

## 2019-12-14 ENCOUNTER — Other Ambulatory Visit (INDEPENDENT_AMBULATORY_CARE_PROVIDER_SITE_OTHER): Payer: Self-pay | Admitting: Primary Care

## 2019-12-14 DIAGNOSIS — F418 Other specified anxiety disorders: Secondary | ICD-10-CM

## 2019-12-14 NOTE — Telephone Encounter (Signed)
Requested medications are due for refill today yes  Requested medications are on the active medication list yes  Last refill 8/1  Future visit scheduled no  Notes to clinic Failed protocol of valid visit within 6 months, no visit scheduled.

## 2019-12-14 NOTE — Telephone Encounter (Signed)
Requested medication (s) are due for refill today: yes  Requested medication (s) are on the active medication list: yes  Last refill:  11/09/18  Future visit scheduled: no  Notes to clinic:  med expired 11/09/19- attempted to call pt, but phone is not in service- no other numbers listed in chart   Requested Prescriptions  Pending Prescriptions Disp Refills   DULoxetine (CYMBALTA) 60 MG capsule [Pharmacy Med Name: DULOXETINE DR 60MG  CAPSULES] 90 capsule 3    Sig: TAKE ONE CAPSULE BY MOUTH DAILY      Psychiatry: Antidepressants - SNRI Failed - 12/14/2019  3:09 AM      Failed - Valid encounter within last 6 months    Recent Outpatient Visits           6 months ago Status post fall   [facility], NP   6 months ago Pain in joint of left shoulder   [facility], NP   12 months ago Osteopenia after menopause   [facility], NP   1 year ago Cervical cancer screening   [facility], NP   1 year ago Breast cancer screening   [facility], NP              Passed - Completed PHQ-2 or PHQ-9 in the last 360 days      Passed - Last BP in normal range    BP Readings from Last 1 Encounters:  05/29/19 138/81

## 2019-12-23 ENCOUNTER — Other Ambulatory Visit (INDEPENDENT_AMBULATORY_CARE_PROVIDER_SITE_OTHER): Payer: Self-pay | Admitting: Primary Care

## 2019-12-23 DIAGNOSIS — F418 Other specified anxiety disorders: Secondary | ICD-10-CM

## 2019-12-23 NOTE — Telephone Encounter (Signed)
Requested medication (s) are due for refill today: yes  Requested medication (s) are on the active medication list: yes  Last refill:  11/09/2018 #90 3 refills  Future visit scheduled: no  Notes to clinic:  expired medication. Needs appt. Do you want to renew medication?      Requested Prescriptions  Pending Prescriptions Disp Refills   DULoxetine (CYMBALTA) 60 MG capsule [Pharmacy Med Name: DULOXETINE DR 60MG  CAPSULES] 90 capsule 3    Sig: TAKE ONE CAPSULE BY MOUTH DAILY      Psychiatry: Antidepressants - SNRI Failed - 12/23/2019  5:30 PM      Failed - Valid encounter within last 6 months    Recent Outpatient Visits           6 months ago Status post fall   [facility], NP   7 months ago Pain in joint of left shoulder   [facility], NP   1 year ago Osteopenia after menopause   [facility], NP   1 year ago Cervical cancer screening   [facility], NP   1 year ago Breast cancer screening   [facility], NP              Passed - Completed PHQ-2 or PHQ-9 in the last 360 days      Passed - Last BP in normal range    BP Readings from Last 1 Encounters:  05/29/19 138/81

## 2019-12-31 ENCOUNTER — Other Ambulatory Visit (INDEPENDENT_AMBULATORY_CARE_PROVIDER_SITE_OTHER): Payer: Self-pay | Admitting: Primary Care

## 2019-12-31 NOTE — Telephone Encounter (Signed)
Requested Prescriptions  Pending Prescriptions Disp Refills  . diclofenac Sodium (VOLTAREN) 1 % GEL [Pharmacy Med Name: DICLOFENAC 1% GEL 100GM] 100 g 1    Sig: APPLY 4 GRAMS TOPICALLY TO THE AFFECTED AREA FOUR TIMES DAILY     Analgesics:  Topicals Passed - 12/31/2019  4:30 PM      Passed - Valid encounter within last 12 months    Recent Outpatient Visits          7 months ago Status post fall   [facility] Grayce Sessions, NP   7 months ago Pain in joint of left shoulder   [facility] Grayce Sessions, NP   1 year ago Osteopenia after menopause   [facility] Grayce Sessions, NP   1 year ago Cervical cancer screening   [facility] Grayce Sessions, NP   1 year ago Breast cancer screening   [facility] Grayce Sessions, NP

## 2020-01-23 ENCOUNTER — Other Ambulatory Visit (INDEPENDENT_AMBULATORY_CARE_PROVIDER_SITE_OTHER): Payer: Self-pay | Admitting: Family Medicine

## 2020-04-08 ENCOUNTER — Other Ambulatory Visit (INDEPENDENT_AMBULATORY_CARE_PROVIDER_SITE_OTHER): Payer: Self-pay | Admitting: Primary Care

## 2020-04-23 ENCOUNTER — Other Ambulatory Visit (INDEPENDENT_AMBULATORY_CARE_PROVIDER_SITE_OTHER): Payer: Self-pay | Admitting: Primary Care

## 2020-04-30 ENCOUNTER — Other Ambulatory Visit: Payer: Self-pay

## 2020-04-30 ENCOUNTER — Ambulatory Visit (INDEPENDENT_AMBULATORY_CARE_PROVIDER_SITE_OTHER): Payer: Medicaid Other | Admitting: Primary Care

## 2020-04-30 ENCOUNTER — Encounter (INDEPENDENT_AMBULATORY_CARE_PROVIDER_SITE_OTHER): Payer: Self-pay | Admitting: Primary Care

## 2020-04-30 VITALS — BP 105/70 | HR 101 | Temp 97.3°F | Ht 61.0 in | Wt 114.0 lb

## 2020-04-30 DIAGNOSIS — Z13220 Encounter for screening for lipoid disorders: Secondary | ICD-10-CM

## 2020-04-30 DIAGNOSIS — F418 Other specified anxiety disorders: Secondary | ICD-10-CM

## 2020-04-30 DIAGNOSIS — K5900 Constipation, unspecified: Secondary | ICD-10-CM

## 2020-04-30 DIAGNOSIS — Z79899 Other long term (current) drug therapy: Secondary | ICD-10-CM | POA: Diagnosis not present

## 2020-04-30 DIAGNOSIS — F1020 Alcohol dependence, uncomplicated: Secondary | ICD-10-CM

## 2020-04-30 MED ORDER — SENNA 8.6 MG PO TABS: 1.0000 | ORAL_TABLET | Freq: Every day | ORAL | 1 refills | Status: DC | PRN

## 2020-04-30 MED ORDER — NALTREXONE HCL 50 MG PO TABS: 50.0000 mg | ORAL_TABLET | Freq: Every day | ORAL | 2 refills | Status: DC

## 2020-04-30 MED ORDER — DULOXETINE HCL 60 MG PO CPEP: 60.0000 mg | ORAL_CAPSULE | Freq: Every day | ORAL | 3 refills | Status: DC

## 2020-04-30 MED ORDER — MULTI-VITAMIN/MINERALS PO TABS: 1.0000 | ORAL_TABLET | Freq: Every day | ORAL | 6 refills | Status: DC

## 2020-04-30 MED ORDER — ALENDRONATE SODIUM 70 MG PO TABS: 70.0000 mg | ORAL_TABLET | ORAL | 11 refills | Status: DC

## 2020-04-30 MED ORDER — AMLODIPINE BESYLATE 2.5 MG PO TABS: 2.5000 mg | ORAL_TABLET | Freq: Every day | ORAL | 1 refills | Status: DC

## 2020-04-30 NOTE — Patient Instructions (Signed)
MANAGEMENT OF CHRONIC CONSTIPATION  Drink fluids in the recommended amount everyday. Recommend amount is 8 cups of water daily. Do not replace water with Gatorade or Powerade as these should only be used when you are dehydrated.  Eat lots of high fiber foods-fruits, veggies, bran and whole grain instead of white bread Be active everyday. Inactivity makes constipation worse. Add psyllium daily (Metamucil) which comes in capsules now. Start very low dose and work up to recommended dose on bottle daily. Stay away from Milk of Magnesia or any magnesium containing laxative, unless you need it to clear things out rarely. It is an addictive laxative and your gut will become dependent on it. If that is not working, I would start Miralax, which you can buy in generic 17 gms daily. It's a powder and not an "addictive laxative". Take it every day and titrate the dose up or down to get the daily Bm. We will consider the use of other pharmacological treatments should the above recommendations prove to be unsuccessful.   

## 2020-04-30 NOTE — Progress Notes (Signed)
Established Patient Office Visit  Subjective:  Patient ID: Betty Avery, female    DOB: Jan 18, 1961  Age: 60 y.o. MRN: [id number]  CC:  Chief Complaint  Patient presents with  . Medication Refill    HPI Betty Avery is a 60 year old female who  presents for medication. She does state having problems with constipation. Asked was she drinking stated no. She was more focus , compliant with answering questions and not off subject allover the place. Told her very proud of her stop drinking.  Past Medical History:  Diagnosis Date  . Chronic constipation   . Hypercholesteremia   . Hypertension   . S/P colonoscopic polypectomy 2013  . Wears glasses     Past Surgical History:  Procedure Laterality Date  . COLONOSCOPY     x2  . LEFT HEART CATHETERIZATION WITH CORONARY ANGIOGRAM N/A 02/18/2014   Procedure: LEFT HEART CATHETERIZATION WITH CORONARY ANGIOGRAM;  Surgeon: Clent Demark, MD;  Location: [place] [facility]ATH [facility];  Service: Cardiovascular;  Laterality: N/A;  . ORBITAL FRACTURE SURGERY  2001   rt eye-fx-plate   . ORIF ANKLE FRACTURE Left 12/20/2016   Procedure: OPEN REDUCTION INTERNAL FIXATION (ORIF) LEFT  ANKLE FRACTURE;  Surgeon: Renette Butters, MD;  Location: [facility];  Service: Orthopedics;  Laterality: Left;  . TONSILLECTOMY      History reviewed. No pertinent family history.  Social History   Socioeconomic History  . Marital status: Single    Spouse name: Not on file  . Number of children: Not on file  . Years of education: Not on file  . Highest education level: Not on file  Occupational History  . Not on file  Tobacco Use  . Smoking status: Never Smoker  . Smokeless tobacco: Never Used  Substance and Sexual Activity  . Alcohol use: Yes    Alcohol/week: 14.0 standard drinks    Types: 14 Cans of beer per week    Comment: beer- alcohol level 330 when pt fell  . Drug use: No  . Sexual activity: Not on file    Comment: a few a day  Other  Topics Concern  . Not on file  Social History Narrative  . Not on file   Social Determinants of Health   Financial Resource Strain: Not on file  Food Insecurity: Not on file  Transportation Needs: Not on file  Physical Activity: Not on file  Stress: Not on file  Social Connections: Not on file  Intimate Partner Violence: Not on file    Outpatient Medications Prior to Visit  Medication Sig Dispense Refill  . aspirin EC 81 MG tablet Take 1 tablet (81 mg total) by mouth daily. 90 tablet 3  . diclofenac Sodium (VOLTAREN) 1 % GEL APPLY 4 GRAMS TOPICALLY TO THE AFFECTED AREA FOUR TIMES DAILY 100 g 1  . ibuprofen (ADVIL) 600 MG tablet TAKE 1 TABLET BY MOUTH EVERY 8 HOURS AS NEEDED FOR MODERATE PAIN 60 tablet 1  . LORazepam (ATIVAN) 2 MG tablet Take one tablet every hour only if symptoms of delerium tremens arise. Do not take more than 5 tablets in one day. 15 tablet 0  . megestrol (MEGACE) 20 MG tablet TAKE 1 TABLET(20 MG) BY MOUTH DAILY 30 tablet 2  . NITROSTAT 0.4 MG SL tablet Place 0.4 mg under the tongue every 5 (five) minutes as needed for chest pain.   0  . amLODipine (NORVASC) 2.5 MG tablet Take 1 tablet (2.5 mg total)  by mouth daily. 30 tablet 3  . b complex vitamins tablet Take 1 tablet by mouth daily. 30 tablet 1  . dicyclomine (BENTYL) 10 MG capsule Take 1 capsule (10 mg total) by mouth 4 (four) times daily -  before meals and at bedtime. 120 capsule 0  . DULoxetine (CYMBALTA) 60 MG capsule TAKE ONE CAPSULE BY MOUTH DAILY 90 capsule 3  . folic acid (FOLVITE) 1 MG tablet Take 1 tablet (1 mg total) by mouth daily. 30 tablet 1  . meloxicam (MOBIC) 15 MG tablet Take 15 mg by mouth daily.    . Multiple Vitamins-Minerals (MULTIVITAMIN WITH MINERALS) tablet Take 1 tablet by mouth daily. 30 tablet 6  . naltrexone (DEPADE) 50 MG tablet Take 1 tablet (50 mg total) by mouth daily. 90 tablet 2  . omeprazole (PRILOSEC) 20 MG capsule Take 20 mg by mouth daily.    Marland Kitchen PROAIR HFA 108 (90 BASE)  MCG/ACT inhaler Inhale 1-2 puffs into the lungs every 6 (six) hours as needed for wheezing or shortness of breath.   0  . tiZANidine (ZANAFLEX) 4 MG tablet Take 4 mg by mouth every 8 (eight) hours as needed for muscle spasms.     Marland Kitchen alendronate (FOSAMAX) 70 MG tablet Take 1 tablet (70 mg total) by mouth every 7 (seven) days. Take with a full glass of water on an empty stomach. (Patient not taking: Reported on 04/30/2020) 4 tablet 11  . cyclobenzaprine (FLEXERIL) 10 MG tablet Take 1 tablet (10 mg total) by mouth 2 (two) times daily as needed for muscle spasms. 10 tablet 0   No facility-administered medications prior to visit.    Allergies  Allergen Reactions  . Latex Itching    ROS Review of Systems  Gastrointestinal: Positive for abdominal pain and constipation.  Psychiatric/Behavioral:       Depression   All other systems reviewed and are negative.     Objective:    Physical Exam Vitals reviewed.  Constitutional:      Appearance: Normal appearance.     Comments: Thin frame   HENT:     Head: Normocephalic.     Right Ear: Tympanic membrane and external ear normal.     Left Ear: Tympanic membrane and external ear normal.     Nose: Nose normal.  Eyes:     Extraocular Movements: Extraocular movements intact.  Cardiovascular:     Rate and Rhythm: Normal rate and regular rhythm.  Pulmonary:     Effort: Pulmonary effort is normal.     Breath sounds: Normal breath sounds.  Abdominal:     General: Bowel sounds are normal.  Musculoskeletal:        General: Normal range of motion.     Cervical back: Normal range of motion.  Skin:    General: Skin is warm and dry.  Neurological:     Mental Status: She is alert and oriented to person, place, and time.  Psychiatric:        Mood and Affect: Mood normal.        Behavior: Behavior normal.        Thought Content: Thought content normal.        Judgment: Judgment normal.     BP 105/70 (BP Location: Left Arm, Patient Position:  Sitting, Cuff Size: Normal)   Pulse (!) 101   Temp (!) 97.3 F (36.3 C) (Temporal)   Ht $R'5\' 1"'Xs$  (1.549 m)   Wt 114 lb (51.7 kg)   LMP 09/21/2012  SpO2 99%   BMI 21.54 kg/m  Wt Readings from Last 3 Encounters:  04/30/20 114 lb (51.7 kg)  05/29/19 116 lb 12.8 oz (53 kg)  12/05/18 120 lb 6.4 oz (54.6 kg)     Health Maintenance Due  Topic Date Due  . COVID-19 Vaccine (1) Never done  . MAMMOGRAM  01/18/2018    There are no preventive care reminders to display for this patient.  Lab Results  Component Value Date   TSH 1.890 10/24/2017   Lab Results  Component Value Date   WBC 4.3 05/29/2019   HGB 11.4 05/29/2019   HCT 35.7 05/29/2019   MCV 93 05/29/2019   PLT 282 05/29/2019   Lab Results  Component Value Date   NA 135 05/29/2019   K 5.2 05/29/2019   CO2 19 (L) 05/29/2019   GLUCOSE 81 05/29/2019   BUN 6 05/29/2019   CREATININE 0.61 05/29/2019   BILITOT 0.8 05/29/2019   ALKPHOS 52 05/29/2019   AST 128 (H) 05/29/2019   ALT 81 (H) 05/29/2019   PROT 7.2 05/29/2019   ALBUMIN 4.8 05/29/2019   CALCIUM 9.7 05/29/2019   ANIONGAP 12 11/14/2018   Lab Results  Component Value Date   CHOL 196 05/29/2019   Lab Results  Component Value Date   HDL 142 05/29/2019   Lab Results  Component Value Date   LDLCALC 46 05/29/2019   Lab Results  Component Value Date   TRIG 36 05/29/2019   Lab Results  Component Value Date   CHOLHDL 1.4 05/29/2019   No results found for: HGBA1C    Assessment & Plan:  Betty Avery was seen today for medication refill.  Diagnoses and all orders for this visit:  Lipid screening -     Lipid Panel  Medication management -     CBC with Differential -     CMP14+EGFR -     alendronate (FOSAMAX) 70 MG tablet; Take 1 tablet (70 mg total) by mouth every 7 (seven) days. Take with a full glass of water on an empty stomach. -     naltrexone (DEPADE) 50 MG tablet; Take 1 tablet (50 mg total) by mouth daily. -     amLODipine (NORVASC) 2.5 MG tablet;  Take 1 tablet (2.5 mg total) by mouth daily. -     Multiple Vitamins-Minerals (MULTIVITAMIN WITH MINERALS) tablet; Take 1 tablet by mouth daily. -     DULoxetine (CYMBALTA) 60 MG capsule; Take 1 capsule (60 mg total) by mouth daily.  Depression with anxiety Flowsheet Row Office Visit from 04/30/2020 in [facility]  PHQ-9 Total Score 15    Currently on Cymbalta no complaints of increase depression, anxiety feels dose is appropriate  -     DULoxetine (CYMBALTA) 60 MG capsule; Take 1 capsule (60 mg total) by mouth daily.  Constipation, unspecified constipation type MANAGEMENT OF CHRONIC CONSTIPATION Drink fluids in the recommended amount everyday. Recommend amount is 8 cups of water daily. Do not replace water with Gatorade or Powerade as these should only be used when you are dehydrated.  Eat lots of high fiber foods-fruits, veggies, bran and whole grain instead of white bread Be active everyday. Inactivity makes constipation worse. Sent in laxative and stool softener  Information place on AVS   Alcoholism [facility]) Per patient stop drinking alcohol last ethyol test was negative .  Continue  naltrexone (DEPADE) 50 MG tablet; Take 1 tablet (50 mg total) by mouth daily.  Other orders -  senna (SENOKOT) 8.6 MG TABS tablet; Take 1 tablet (8.6 mg total) by mouth daily as needed for mild constipation.    Meds ordered this encounter  Medications  . alendronate (FOSAMAX) 70 MG tablet    Sig: Take 1 tablet (70 mg total) by mouth every 7 (seven) days. Take with a full glass of water on an empty stomach.    Dispense:  4 tablet    Refill:  11  . naltrexone (DEPADE) 50 MG tablet    Sig: Take 1 tablet (50 mg total) by mouth daily.    Dispense:  90 tablet    Refill:  2  . amLODipine (NORVASC) 2.5 MG tablet    Sig: Take 1 tablet (2.5 mg total) by mouth daily.    Dispense:  90 tablet    Refill:  1  . Multiple Vitamins-Minerals (MULTIVITAMIN WITH MINERALS) tablet    Sig:  Take 1 tablet by mouth daily.    Dispense:  30 tablet    Refill:  6  . DULoxetine (CYMBALTA) 60 MG capsule    Sig: Take 1 capsule (60 mg total) by mouth daily.    Dispense:  90 capsule    Refill:  3  . senna (SENOKOT) 8.6 MG TABS tablet    Sig: Take 1 tablet (8.6 mg total) by mouth daily as needed for mild constipation.    Dispense:  120 tablet    Refill:  1    Follow-up: Return in about 6 months (around 10/31/2020) for routine f/u and medication management.    Kerin Perna, NP

## 2020-05-01 LAB — CBC WITH DIFFERENTIAL/PLATELET
Basophils Absolute: 0.1 10*3/uL (ref 0.0–0.2)
Basos: 2 %
EOS (ABSOLUTE): 0.2 10*3/uL (ref 0.0–0.4)
Eos: 4 %
Hematocrit: 37.2 % (ref 34.0–46.6)
Hemoglobin: 12.6 g/dL (ref 11.1–15.9)
Immature Grans (Abs): 0 10*3/uL (ref 0.0–0.1)
Immature Granulocytes: 0 %
Lymphocytes Absolute: 2 10*3/uL (ref 0.7–3.1)
Lymphs: 35 %
MCH: 30.5 pg (ref 26.6–33.0)
MCHC: 33.9 g/dL (ref 31.5–35.7)
MCV: 90 fL (ref 79–97)
Monocytes Absolute: 0.6 10*3/uL (ref 0.1–0.9)
Monocytes: 10 %
Neutrophils Absolute: 2.9 10*3/uL (ref 1.4–7.0)
Neutrophils: 49 %
Platelets: 320 10*3/uL (ref 150–450)
RBC: 4.13 x10E6/uL (ref 3.77–5.28)
RDW: 12.7 % (ref 11.7–15.4)
WBC: 5.8 10*3/uL (ref 3.4–10.8)

## 2020-05-01 LAB — CMP14+EGFR
ALT: 17 IU/L (ref 0–32)
AST: 30 IU/L (ref 0–40)
Albumin/Globulin Ratio: 1.8 (ref 1.2–2.2)
Albumin: 5.3 g/dL — ABNORMAL HIGH (ref 3.8–4.9)
Alkaline Phosphatase: 51 IU/L (ref 44–121)
BUN/Creatinine Ratio: 11 (ref 9–23)
BUN: 9 mg/dL (ref 6–24)
Bilirubin Total: 0.9 mg/dL (ref 0.0–1.2)
CO2: 16 mmol/L — ABNORMAL LOW (ref 20–29)
Calcium: 10 mg/dL (ref 8.7–10.2)
Chloride: 92 mmol/L — ABNORMAL LOW (ref 96–106)
Creatinine, Ser: 0.79 mg/dL (ref 0.57–1.00)
Globulin, Total: 2.9 g/dL (ref 1.5–4.5)
Glucose: 60 mg/dL — ABNORMAL LOW (ref 65–99)
Potassium: 4.9 mmol/L (ref 3.5–5.2)
Sodium: 131 mmol/L — ABNORMAL LOW (ref 134–144)
Total Protein: 8.2 g/dL (ref 6.0–8.5)
eGFR: 87 mL/min/{1.73_m2} (ref >59)

## 2020-05-01 LAB — LIPID PANEL
Chol/HDL Ratio: 1.9 ratio (ref 0.0–4.4)
Cholesterol, Total: 240 mg/dL — ABNORMAL HIGH (ref 100–199)
HDL: 127 mg/dL (ref >39)
LDL Chol Calc (NIH): 101 mg/dL — ABNORMAL HIGH (ref 0–99)
Triglycerides: 74 mg/dL (ref 0–149)
VLDL Cholesterol Cal: 12 mg/dL (ref 5–40)

## 2020-05-06 ENCOUNTER — Other Ambulatory Visit (INDEPENDENT_AMBULATORY_CARE_PROVIDER_SITE_OTHER): Payer: Self-pay | Admitting: Primary Care

## 2020-05-06 DIAGNOSIS — E782 Mixed hyperlipidemia: Secondary | ICD-10-CM

## 2020-05-06 MED ORDER — ATORVASTATIN CALCIUM 20 MG PO TABS: 20.0000 mg | ORAL_TABLET | Freq: Every day | ORAL | 3 refills | Status: DC

## 2020-05-11 ENCOUNTER — Encounter (INDEPENDENT_AMBULATORY_CARE_PROVIDER_SITE_OTHER): Payer: Self-pay

## 2020-05-13 ENCOUNTER — Other Ambulatory Visit (INDEPENDENT_AMBULATORY_CARE_PROVIDER_SITE_OTHER): Payer: Self-pay | Admitting: Primary Care

## 2020-05-16 ENCOUNTER — Other Ambulatory Visit (INDEPENDENT_AMBULATORY_CARE_PROVIDER_SITE_OTHER): Payer: Self-pay | Admitting: Family Medicine

## 2020-06-12 ENCOUNTER — Emergency Department (HOSPITAL_COMMUNITY)
Admission: EM | Admit: 2020-06-12 | Discharge: 2020-06-13 | Disposition: A | Discharge status: Home / Self Care | Payer: Medicaid Other | Attending: Emergency Medicine | Admitting: Emergency Medicine

## 2020-06-12 ENCOUNTER — Other Ambulatory Visit: Payer: Self-pay

## 2020-06-12 DIAGNOSIS — Z91040 Latex allergy status: Secondary | ICD-10-CM | POA: Insufficient documentation

## 2020-06-12 DIAGNOSIS — Z79899 Other long term (current) drug therapy: Secondary | ICD-10-CM | POA: Diagnosis not present

## 2020-06-12 DIAGNOSIS — Z7982 Long term (current) use of aspirin: Secondary | ICD-10-CM | POA: Diagnosis not present

## 2020-06-12 DIAGNOSIS — I10 Essential (primary) hypertension: Secondary | ICD-10-CM | POA: Insufficient documentation

## 2020-06-12 DIAGNOSIS — E871 Hypo-osmolality and hyponatremia: Secondary | ICD-10-CM

## 2020-06-12 DIAGNOSIS — R1084 Generalized abdominal pain: Secondary | ICD-10-CM | POA: Diagnosis not present

## 2020-06-12 LAB — TYPE AND SCREEN
ABO/RH(D): A POS
Antibody Screen: NEGATIVE

## 2020-06-12 LAB — COMPREHENSIVE METABOLIC PANEL
ALT: 95 U/L — ABNORMAL HIGH (ref 0–44)
AST: 91 U/L — ABNORMAL HIGH (ref 15–41)
Albumin: 4.5 g/dL (ref 3.5–5.0)
Alkaline Phosphatase: 42 U/L (ref 38–126)
Anion gap: 11 (ref 5–15)
BUN: <5 mg/dL — ABNORMAL LOW (ref 6–20)
CO2: 22 mmol/L (ref 22–32)
Calcium: 9.3 mg/dL (ref 8.9–10.3)
Chloride: 96 mmol/L — ABNORMAL LOW (ref 98–111)
Creatinine, Ser: 0.51 mg/dL (ref 0.44–1.00)
GFR, Estimated: >60 mL/min (ref >60)
Glucose, Bld: 81 mg/dL (ref 70–99)
Potassium: 3.9 mmol/L (ref 3.5–5.1)
Sodium: 129 mmol/L — ABNORMAL LOW (ref 135–145)
Total Bilirubin: 0.4 mg/dL (ref 0.3–1.2)
Total Protein: 7.8 g/dL (ref 6.5–8.1)

## 2020-06-12 LAB — CBC WITH DIFFERENTIAL/PLATELET
Abs Immature Granulocytes: 0.03 10*3/uL (ref 0.00–0.07)
Basophils Absolute: 0.1 10*3/uL (ref 0.0–0.1)
Basophils Relative: 1 %
Eosinophils Absolute: 0.4 10*3/uL (ref 0.0–0.5)
Eosinophils Relative: 6 %
HCT: 33.9 % — ABNORMAL LOW (ref 36.0–46.0)
Hemoglobin: 11.5 g/dL — ABNORMAL LOW (ref 12.0–15.0)
Immature Granulocytes: 1 %
Lymphocytes Relative: 40 %
Lymphs Abs: 2.7 10*3/uL (ref 0.7–4.0)
MCH: 29.9 pg (ref 26.0–34.0)
MCHC: 33.9 g/dL (ref 30.0–36.0)
MCV: 88.3 fL (ref 80.0–100.0)
Monocytes Absolute: 0.6 10*3/uL (ref 0.1–1.0)
Monocytes Relative: 8 %
Neutro Abs: 3 10*3/uL (ref 1.7–7.7)
Neutrophils Relative %: 44 %
Platelets: 243 10*3/uL (ref 150–400)
RBC: 3.84 MIL/uL — ABNORMAL LOW (ref 3.87–5.11)
RDW: 13 % (ref 11.5–15.5)
WBC: 6.6 10*3/uL (ref 4.0–10.5)
nRBC: 0 % (ref 0.0–0.2)

## 2020-06-12 LAB — URINALYSIS, ROUTINE W REFLEX MICROSCOPIC
Bilirubin Urine: NEGATIVE
Glucose, UA: NEGATIVE mg/dL
Hgb urine dipstick: NEGATIVE
Ketones, ur: NEGATIVE mg/dL
Leukocytes,Ua: NEGATIVE
Nitrite: NEGATIVE
Protein, ur: NEGATIVE mg/dL
Specific Gravity, Urine: 1.003 — ABNORMAL LOW (ref 1.005–1.030)
pH: 5 (ref 5.0–8.0)

## 2020-06-12 LAB — ABO/RH: ABO/RH(D): A POS

## 2020-06-12 LAB — LIPASE, BLOOD: Lipase: 29 U/L (ref 11–51)

## 2020-06-12 NOTE — ED Triage Notes (Signed)
Emergency Medicine Provider Triage Evaluation Note  Betty Avery , a 60 y.o. female  was evaluated in triage.  Pt complains of abd pain and dark stools. Was seen by pcp and recommended to get a CT abd/pelvis  Review of Systems  Positive: Abd pain, dark stools, nv Negative: fever  Physical Exam  BP (!) 152/80 (BP Location: Left Arm)   Pulse 79   Temp 98.4 F (36.9 C) (Oral)   Resp 18   Ht 5\' 1"  (1.549 m)   Wt 52.6 kg   LMP 09/21/2012   SpO2 99%   BMI 21.92 kg/m  Gen:   Awake, no distress   HEENT:  Atraumatic  Resp:  Normal effort  Cardiac:  Normal rate  Abd:   Nondistended, nontender  MSK:   Moves extremities without difficulty  Neuro:  Speech clear   Medical Decision Making  Medically screening exam initiated at 8:54 PM.  Appropriate orders placed.  TYNESHA FREE was informed that the remainder of the evaluation will be completed by another provider, this initial triage assessment does not replace that evaluation, and the importance of remaining in the ED until their evaluation is complete.  Clinical Impression   MSE was initiated and I personally evaluated the patient and placed orders (if any) at  8:54 PM on June 12, 2020.  The patient appears stable so that the remainder of the MSE may be completed by another provider.    Jun 14, 2020, Karrie Meres 06/12/20 2054

## 2020-06-13 ENCOUNTER — Emergency Department (HOSPITAL_COMMUNITY): Payer: Medicaid Other

## 2020-06-13 LAB — POC OCCULT BLOOD, ED: Fecal Occult Bld: NEGATIVE

## 2020-06-13 MED ORDER — FENTANYL CITRATE (PF) 100 MCG/2ML IJ SOLN
50.0000 ug | Freq: Once | INTRAMUSCULAR | Status: AC
  Administered 2020-06-13: 50 ug via INTRAVENOUS
  Filled 2020-06-13: qty 2

## 2020-06-13 MED ORDER — IOHEXOL 300 MG/ML  SOLN
100.0000 mL | Freq: Once | INTRAMUSCULAR | Status: AC | PRN
  Administered 2020-06-13: 100 mL via INTRAVENOUS

## 2020-06-13 MED ORDER — SODIUM CHLORIDE 0.9 % IV BOLUS
500.0000 mL | Freq: Once | INTRAVENOUS | Status: AC
  Administered 2020-06-13: 500 mL via INTRAVENOUS

## 2020-06-13 MED ORDER — DICYCLOMINE HCL 20 MG PO TABS: 20.0000 mg | ORAL_TABLET | Freq: Two times a day (BID) | ORAL | 0 refills | Status: DC

## 2020-06-13 NOTE — Discharge Instructions (Addendum)
Your stool test is negative for blood and your abdominal CT is also negative for any serious condition.   You can be discharged home and should plan to follow up with your doctor after the weekend. Take Bentyl for abdominal pain as directed and follow up with your doctor to insure your pain resolved.   If you develop any high fever, severe pain, vomiting, please return to the emergency department for further evaluation.

## 2020-06-13 NOTE — ED Provider Notes (Signed)
[facility] EMERGENCY DEPARTMENT Provider Note   CSN: [id number] Arrival date & time: 06/12/20  1905     History Chief Complaint  Patient presents with  . Abdominal Pain    Pt c/o abd pain that started earlier in the week    Betty Avery is a 60 y.o. female.  Patient to ED with complaint of generalized, intermittent abdominal pain for the past several days. No nausea or vomiting. No fever. She reports regular bowel movements that appears dark/black. History of GI bleeding. She reports history of polyps on previous remote colonoscopy. No urinary symptoms. She states other symptoms include generalized weakness, fatigue, lightheaded. No falls secondary to weakness.   The history is provided by the patient. No language interpreter was used.       Past Medical History:  Diagnosis Date  . Chronic constipation   . Hypercholesteremia   . Hypertension   . S/P colonoscopic polypectomy 2013  . Wears glasses     Patient Active Problem List   Diagnosis Date Noted  . Current moderate episode of major depressive disorder (HCC) 05/29/2019  . Alcoholism (HCC) 03/21/2018  . Hypertension 03/21/2018    Class: Chronic    Past Surgical History:  Procedure Laterality Date  . COLONOSCOPY     x2  . LEFT HEART CATHETERIZATION WITH CORONARY ANGIOGRAM N/A 02/18/2014   Procedure: LEFT HEART CATHETERIZATION WITH CORONARY ANGIOGRAM;  Surgeon: Robynn Pane, MD;  Location: [facility];  Service: Cardiovascular;  Laterality: N/A;  . ORBITAL FRACTURE SURGERY  2001   rt eye-fx-plate   . ORIF ANKLE FRACTURE Left 12/20/2016   Procedure: OPEN REDUCTION INTERNAL FIXATION (ORIF) LEFT  ANKLE FRACTURE;  Surgeon: Sheral Apley, MD;  Location: [facility];  Service: Orthopedics;  Laterality: Left;  . TONSILLECTOMY       OB History   No obstetric history on file.     No family history on file.  Social History   Tobacco Use  . Smoking status: Never Smoker  .  Smokeless tobacco: Never Used  Substance Use Topics  . Alcohol use: Yes    Alcohol/week: 14.0 standard drinks    Types: 14 Cans of beer per week    Comment: beer- alcohol level 330 when pt fell  . Drug use: No    Home Medications Prior to Admission medications   Medication Sig Start Date End Date Taking? Authorizing Provider  alendronate (FOSAMAX) 70 MG tablet Take 1 tablet (70 mg total) by mouth every 7 (seven) days. Take with a full glass of water on an empty stomach. 04/30/20   Grayce Sessions, NP  amLODipine (NORVASC) 2.5 MG tablet Take 1 tablet (2.5 mg total) by mouth daily. 04/30/20   Grayce Sessions, NP  aspirin EC 81 MG tablet Take 1 tablet (81 mg total) by mouth daily. 10/24/17   Loletta Specter, PA-C  atorvastatin (LIPITOR) 20 MG tablet Take 1 tablet (20 mg total) by mouth daily. 05/06/20   Grayce Sessions, NP  diclofenac Sodium (VOLTAREN) 1 % GEL APPLY 4 GRAMS TOPICALLY TO THE AFFECTED AREA FOUR TIMES DAILY 12/31/19   Grayce Sessions, NP  DULoxetine (CYMBALTA) 60 MG capsule Take 1 capsule (60 mg total) by mouth daily. 04/30/20   Grayce Sessions, NP  ibuprofen (ADVIL) 600 MG tablet TAKE 1 TABLET BY MOUTH EVERY 8 HOURS AS NEEDED FOR MODERATE PAIN 05/13/20   Grayce Sessions, NP  LORazepam (ATIVAN) 2 MG tablet Take one  tablet every hour only if symptoms of delerium tremens arise. Do not take more than 5 tablets in one day. 11/21/17   Loletta Specter, PA-C  megestrol (MEGACE) 20 MG tablet TAKE 1 TABLET(20 MG) BY MOUTH DAILY 05/16/20   Grayce Sessions, NP  Multiple Vitamins-Minerals (MULTIVITAMIN WITH MINERALS) tablet Take 1 tablet by mouth daily. 04/30/20   Grayce Sessions, NP  naltrexone (DEPADE) 50 MG tablet Take 1 tablet (50 mg total) by mouth daily. 04/30/20   Grayce Sessions, NP  NITROSTAT 0.4 MG SL tablet Place 0.4 mg under the tongue every 5 (five) minutes as needed for chest pain.  02/13/14   [provider]  senna (SENOKOT) 8.6 MG TABS  tablet Take 1 tablet (8.6 mg total) by mouth daily as needed for mild constipation. 04/30/20   Grayce Sessions, NP    Allergies    Latex  Review of Systems   Review of Systems  Constitutional: Negative for chills and fever.  HENT: Negative.   Respiratory: Negative.   Cardiovascular: Negative.   Gastrointestinal: Positive for abdominal pain. Negative for diarrhea and vomiting.       Reports dark/black stool  Genitourinary: Negative.   Musculoskeletal: Negative.   Skin: Negative.   Neurological: Positive for weakness and light-headedness. Negative for syncope.    Physical Exam Updated Vital Signs BP 125/79 (BP Location: Left Arm)   Pulse 88   Temp 98.1 F (36.7 C) (Oral)   Resp 16   Ht 5\' 1"  (1.549 m)   Wt 52.6 kg   LMP 09/21/2012   SpO2 97%   BMI 21.92 kg/m   Physical Exam Vitals and nursing note reviewed.  Constitutional:      Appearance: She is well-developed.  HENT:     Head: Normocephalic.  Eyes:     Comments: No conjunctival pallor  Cardiovascular:     Rate and Rhythm: Normal rate and regular rhythm.  Pulmonary:     Effort: Pulmonary effort is normal.     Breath sounds: Normal breath sounds.  Abdominal:     General: Bowel sounds are normal.     Palpations: Abdomen is soft.     Tenderness: There is generalized abdominal tenderness. There is no guarding or rebound.  Genitourinary:    Rectum: Guaiac result negative.     Comments: Stool appears dark. Guaiac negative.  Musculoskeletal:        General: Normal range of motion.     Cervical back: Normal range of motion and neck supple.  Skin:    General: Skin is warm and dry.     Findings: No rash.  Neurological:     Mental Status: She is alert and oriented to person, place, and time.     ED Results / Procedures / Treatments   Labs (all labs ordered are listed, but only abnormal results are displayed) Labs Reviewed  COMPREHENSIVE METABOLIC PANEL - Abnormal; Notable for the following components:       Result Value   Sodium 129 (*)    Chloride 96 (*)    BUN <5 (*)    AST 91 (*)    ALT 95 (*)    All other components within normal limits  CBC WITH DIFFERENTIAL/PLATELET - Abnormal; Notable for the following components:   RBC 3.84 (*)    Hemoglobin 11.5 (*)    HCT 33.9 (*)    All other components within normal limits  URINALYSIS, ROUTINE W REFLEX MICROSCOPIC - Abnormal; Notable for the following  components:   Color, Urine STRAW (*)    Specific Gravity, Urine 1.003 (*)    All other components within normal limits  LIPASE, BLOOD  POC OCCULT BLOOD, ED  TYPE AND SCREEN  ABO/RH    EKG None  Radiology No results found.  Procedures Procedures   Medications Ordered in ED Medications - No data to display  ED Course  I have reviewed the triage vital signs and the nursing notes.  Pertinent labs & imaging results that were available during my care of the patient were reviewed by me and considered in my medical decision making (see chart for details).    MDM Rules/Calculators/A&P                          Patient to ED for evaluation of abdominal pain that is generalized and dark stool. She reports her doctor sent her to have CT abd/pel as part of further evaluation.   The patient is overall nontoxic and comfortable in appearance. Stool is dark but is guaiac negative. Hgb 11. VSS, no hypotension or tachycardia to suggest volume loss.   Abd/pel CT obtained and shows only bladder wall thickening, correlate with UA. UA negative for infection. Without fever, leukocytosis, urinary symptoms, doubt acute cystitis. Will add cultures to review with PCP.   The patient is felt appropriate for discharge home. Patient and family member updated. Encouraged close follow up with PCP.  Final Clinical Impression(s) / ED Diagnoses Final diagnoses:  None   1. Abdominal pain 2. Hyponatremia  Rx / DC Orders ED Discharge Orders    None       Elpidio Anis, PA-C 06/13/20 0535    Geoffery Lyons, MD 06/13/20 [phone]

## 2020-06-13 NOTE — ED Notes (Signed)
The pt c/o abd pain for several days some temp  She ate a baked potato while waiting or a portion of it

## 2020-06-15 ENCOUNTER — Telehealth: Payer: Self-pay

## 2020-06-15 NOTE — Telephone Encounter (Signed)
Transition Care Management Follow-up Telephone Call  Date of discharge and from where: 06/13/2020 from [facility]  How have you been since you were released from the hospital? Pt stated that she is feeling okay today. Pt is picking up her rx's today from the pharmacy.   Any questions or concerns? No  Items Reviewed:  Did the pt receive and understand the discharge instructions provided? Yes   Medications obtained and verified? Yes   Other? No   Any new allergies since your discharge? No   Dietary orders reviewed? na  Do you have support at home? Yes   Functional Questionnaire: (I = Independent and D = Dependent) ADLs: I  Bathing/Dressing- I  Meal Prep- I  Eating- I  Maintaining continence- I  Transferring/Ambulation- I  Managing Meds- I   Follow up appointments reviewed:   PCP Hospital f/u appt confirmed? No    Specialist Hospital f/u appt confirmed? No  .  Are transportation arrangements needed? No    If their condition worsens, is the pt aware to call PCP or go to the Emergency Dept.? Yes  Was the patient provided with contact information for the PCP's office or ED? Yes  Was to pt encouraged to call back with questions or concerns? Yes

## 2020-07-26 ENCOUNTER — Other Ambulatory Visit: Payer: Self-pay

## 2020-07-26 ENCOUNTER — Ambulatory Visit (HOSPITAL_COMMUNITY)
Admission: EM | Admit: 2020-07-26 | Discharge: 2020-07-27 | Disposition: A | Discharge status: Home / Self Care | Payer: Medicaid Other | Attending: Behavioral Health | Admitting: Behavioral Health

## 2020-07-26 DIAGNOSIS — Z79899 Other long term (current) drug therapy: Secondary | ICD-10-CM | POA: Insufficient documentation

## 2020-07-26 DIAGNOSIS — F1994 Other psychoactive substance use, unspecified with psychoactive substance-induced mood disorder: Secondary | ICD-10-CM

## 2020-07-26 DIAGNOSIS — Z20822 Contact with and (suspected) exposure to covid-19: Secondary | ICD-10-CM | POA: Insufficient documentation

## 2020-07-26 DIAGNOSIS — F259 Schizoaffective disorder, unspecified: Secondary | ICD-10-CM | POA: Diagnosis not present

## 2020-07-26 LAB — POCT URINE DRUG SCREEN - MANUAL ENTRY (I-SCREEN)
POC Amphetamine UR: NOT DETECTED
POC Buprenorphine (BUP): NOT DETECTED
POC Cocaine UR: NOT DETECTED
POC Marijuana UR: NOT DETECTED
POC Methadone UR: NOT DETECTED
POC Methamphetamine UR: NOT DETECTED
POC Morphine: NOT DETECTED
POC Oxazepam (BZO): NOT DETECTED
POC Oxycodone UR: NOT DETECTED
POC Secobarbital (BAR): NOT DETECTED

## 2020-07-26 LAB — POCT PREGNANCY, URINE: Preg Test, Ur: NEGATIVE

## 2020-07-26 LAB — POC SARS CORONAVIRUS 2 AG: SARSCOV2ONAVIRUS 2 AG: NEGATIVE

## 2020-07-26 MED ORDER — LOPERAMIDE HCL 2 MG PO CAPS: 2.0000 mg | ORAL_CAPSULE | ORAL | Status: DC | PRN

## 2020-07-26 MED ORDER — QUETIAPINE FUMARATE 25 MG PO TABS: 25.0000 mg | ORAL_TABLET | Freq: Two times a day (BID) | ORAL | Status: DC

## 2020-07-26 MED ORDER — LORAZEPAM 1 MG PO TABS: 1.0000 mg | ORAL_TABLET | Freq: Four times a day (QID) | ORAL | Status: DC | PRN

## 2020-07-26 MED ORDER — THIAMINE HCL 100 MG/ML IJ SOLN
100.0000 mg | Freq: Once | INTRAMUSCULAR | Status: AC
  Administered 2020-07-26: 100 mg via INTRAMUSCULAR
  Filled 2020-07-26: qty 2

## 2020-07-26 MED ORDER — ONDANSETRON 4 MG PO TBDP: 4.0000 mg | ORAL_TABLET | Freq: Four times a day (QID) | ORAL | Status: DC | PRN

## 2020-07-26 MED ORDER — MAGNESIUM HYDROXIDE 400 MG/5ML PO SUSP: 30.0000 mL | Freq: Every day | ORAL | Status: DC | PRN

## 2020-07-26 MED ORDER — HYDROXYZINE HCL 25 MG PO TABS: 25.0000 mg | ORAL_TABLET | Freq: Three times a day (TID) | ORAL | Status: DC | PRN

## 2020-07-26 MED ORDER — ALUM & MAG HYDROXIDE-SIMETH 200-200-20 MG/5ML PO SUSP: 30.0000 mL | ORAL | Status: DC | PRN

## 2020-07-26 MED ORDER — ACETAMINOPHEN 325 MG PO TABS: 650.0000 mg | ORAL_TABLET | Freq: Four times a day (QID) | ORAL | Status: DC | PRN

## 2020-07-26 MED ORDER — ASPIRIN EC 81 MG PO TBEC
81.0000 mg | DELAYED_RELEASE_TABLET | Freq: Every day | ORAL | Status: DC
  Administered 2020-07-26 – 2020-07-27 (×2): 81 mg via ORAL
  Filled 2020-07-26 (×3): qty 1

## 2020-07-26 MED ORDER — QUETIAPINE FUMARATE 50 MG PO TABS
50.0000 mg | ORAL_TABLET | Freq: Once | ORAL | Status: AC
  Administered 2020-07-26: 50 mg via ORAL
  Filled 2020-07-26: qty 1

## 2020-07-26 MED ORDER — AMLODIPINE BESYLATE 5 MG PO TABS
2.5000 mg | ORAL_TABLET | Freq: Every day | ORAL | Status: DC
  Administered 2020-07-26 – 2020-07-27 (×2): 2.5 mg via ORAL
  Filled 2020-07-26 (×3): qty 1

## 2020-07-26 MED ORDER — THIAMINE HCL 100 MG PO TABS
100.0000 mg | ORAL_TABLET | Freq: Every day | ORAL | Status: DC
  Administered 2020-07-27: 100 mg via ORAL
  Filled 2020-07-26 (×2): qty 1

## 2020-07-26 MED ORDER — QUETIAPINE FUMARATE 25 MG PO TABS
25.0000 mg | ORAL_TABLET | Freq: Two times a day (BID) | ORAL | Status: DC
  Administered 2020-07-27: 25 mg via ORAL
  Filled 2020-07-26: qty 1

## 2020-07-26 MED ORDER — BACITRACIN-NEOMYCIN-POLYMYXIN 400-5-5000 EX OINT
TOPICAL_OINTMENT | CUTANEOUS | Status: AC
  Administered 2020-07-26: 1 via TOPICAL
  Filled 2020-07-26: qty 1

## 2020-07-26 MED ORDER — ATORVASTATIN CALCIUM 10 MG PO TABS
20.0000 mg | ORAL_TABLET | Freq: Every day | ORAL | Status: DC
  Administered 2020-07-26: 20 mg via ORAL
  Filled 2020-07-26 (×2): qty 2

## 2020-07-26 MED ORDER — BACITRACIN-NEOMYCIN-POLYMYXIN 400-5-5000 EX OINT
1.0000 "application " | TOPICAL_OINTMENT | Freq: Three times a day (TID) | CUTANEOUS | Status: DC
  Administered 2020-07-26 – 2020-07-27 (×2): 1 via TOPICAL
  Filled 2020-07-26 (×2): qty 1

## 2020-07-26 MED ORDER — ADULT MULTIVITAMIN W/MINERALS CH
1.0000 | ORAL_TABLET | Freq: Every day | ORAL | Status: DC
  Administered 2020-07-26 – 2020-07-27 (×2): 1 via ORAL
  Filled 2020-07-26 (×3): qty 1
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
## 2020-07-26 NOTE — [facility] Counselor (Signed)
This counselor has completed First Examination and placed it in the orange folder in adult nursing station.   Patient's family has been notified of plan of care.

## 2020-07-26 NOTE — ED Notes (Signed)
Pt resting in bed watching TV. Respirations even and unlabored. No signs of acute distress noted. Will continue to monitor for safety.
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
## 2020-07-26 NOTE — ED Notes (Signed)
Pt alert and oriented during [facility] admission. Pt is anxious and states "My daughter put me here. I just want to do what I have to do so I can leave tomorrow." Pt denies SI/HI, A/VH, and any pain. Skin assessment complete and witnessed by [place], NT. Abrasion noted to left side of  face near left eye lid. Abrasion also noted on left knee. Blood/lab workup unable to be completed because pt reports she is scared of needles and continuously pulled and jerked her arm away before the needle stick. Education, support, reassurance, and encouragement provided. Pt's belongings in locker #29 and belongings sheet signed. Pt denies any concerns at this time, and verbally contracts for safety. Pt ambulating on the unit with no issues. Pt remains safe on the unit.

## 2020-07-26 NOTE — ED Notes (Signed)
Pt asleep in bed. Respirations even and unlabored. Will continue to monitor for safety. ?

## 2020-07-26 NOTE — ED Notes (Signed)
Pt given meal

## 2020-07-26 NOTE — ED Notes (Signed)
Pt taking a shower 

## 2020-07-27 LAB — COMPREHENSIVE METABOLIC PANEL
ALT: 43 U/L (ref 0–44)
AST: 60 U/L — ABNORMAL HIGH (ref 15–41)
Albumin: 4.7 g/dL (ref 3.5–5.0)
Alkaline Phosphatase: 45 U/L (ref 38–126)
Anion gap: 11 (ref 5–15)
BUN: 13 mg/dL (ref 6–20)
CO2: 25 mmol/L (ref 22–32)
Calcium: 10.1 mg/dL (ref 8.9–10.3)
Chloride: 93 mmol/L — ABNORMAL LOW (ref 98–111)
Creatinine, Ser: 0.77 mg/dL (ref 0.44–1.00)
GFR, Estimated: >60 mL/min (ref >60)
Glucose, Bld: 128 mg/dL — ABNORMAL HIGH (ref 70–99)
Potassium: 4.3 mmol/L (ref 3.5–5.1)
Sodium: 129 mmol/L — ABNORMAL LOW (ref 135–145)
Total Bilirubin: 1.4 mg/dL — ABNORMAL HIGH (ref 0.3–1.2)
Total Protein: 7.8 g/dL (ref 6.5–8.1)

## 2020-07-27 LAB — CBC WITH DIFFERENTIAL/PLATELET
Abs Immature Granulocytes: 0.02 10*3/uL (ref 0.00–0.07)
Basophils Absolute: 0 10*3/uL (ref 0.0–0.1)
Basophils Relative: 1 %
Eosinophils Absolute: 0.1 10*3/uL (ref 0.0–0.5)
Eosinophils Relative: 2 %
HCT: 36.5 % (ref 36.0–46.0)
Hemoglobin: 12.6 g/dL (ref 12.0–15.0)
Immature Granulocytes: 0 %
Lymphocytes Relative: 30 %
Lymphs Abs: 1.5 10*3/uL (ref 0.7–4.0)
MCH: 30.1 pg (ref 26.0–34.0)
MCHC: 34.5 g/dL (ref 30.0–36.0)
MCV: 87.1 fL (ref 80.0–100.0)
Monocytes Absolute: 0.7 10*3/uL (ref 0.1–1.0)
Monocytes Relative: 14 %
Neutro Abs: 2.7 10*3/uL (ref 1.7–7.7)
Neutrophils Relative %: 53 %
Platelets: 331 10*3/uL (ref 150–400)
RBC: 4.19 MIL/uL (ref 3.87–5.11)
RDW: 13.5 % (ref 11.5–15.5)
WBC: 5.1 10*3/uL (ref 4.0–10.5)
nRBC: 0 % (ref 0.0–0.2)

## 2020-07-27 LAB — URINALYSIS, ROUTINE W REFLEX MICROSCOPIC
Bacteria, UA: NONE SEEN
Bilirubin Urine: NEGATIVE
Glucose, UA: NEGATIVE mg/dL
Hgb urine dipstick: NEGATIVE
Ketones, ur: 5 mg/dL — AB
Leukocytes,Ua: NEGATIVE
Nitrite: NEGATIVE
Protein, ur: 30 mg/dL — AB
Specific Gravity, Urine: 1.008 (ref 1.005–1.030)
pH: 5 (ref 5.0–8.0)

## 2020-07-27 LAB — TSH: TSH: 1.791 u[IU]/mL (ref 0.350–4.500)

## 2020-07-27 LAB — RESP PANEL BY RT-PCR (FLU A&B, COVID) ARPGX2
Influenza A by PCR: NEGATIVE
Influenza B by PCR: NEGATIVE
SARS Coronavirus 2 by RT PCR: NEGATIVE

## 2020-07-27 LAB — PREGNANCY, URINE: Preg Test, Ur: NEGATIVE

## 2020-07-27 LAB — LIPID PANEL
Cholesterol: 235 mg/dL — ABNORMAL HIGH (ref 0–200)
HDL: >135 mg/dL (ref >40)
Triglycerides: 38 mg/dL (ref <150)
VLDL: 8 mg/dL (ref 0–40)

## 2020-07-27 LAB — ETHANOL: Alcohol, Ethyl (B): <10 mg/dL (ref <10)
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
## 2020-07-27 NOTE — ED Notes (Signed)
GIVEN LUNCH 

## 2020-07-27 NOTE — ED Notes (Signed)
Pt accepted morning meds w/o difficulty. Pt conversing with other pt's and making her bed. Allowed staff to collect blood specimens per orders. Safety maintained and will continue to monitor.

## 2020-07-27 NOTE — ED Notes (Signed)
Discharge instructions provided and Pt stated understanding. Personal belongings returned. Pt alert, orient and ambulatory. Pt escorted to the front lobby to meet family to go home. Safety maintained.

## 2020-07-27 NOTE — Discharge Instructions (Signed)

## 2020-07-27 NOTE — Progress Notes (Signed)
Per provider, patient seeking outpatient SU resources.  CSW provided Substance Use resources in the AVS with instructions for next steps.    Kristian Mogg, LCSW, LCAS Clincal Social Worker  [facility]

## 2020-07-27 NOTE — ED Notes (Signed)
Pt asleep in bed. Respirations even and unlabored. Will continue to monitor for safety. ?

## 2020-07-28 LAB — HEMOGLOBIN A1C
Hgb A1c MFr Bld: 4.6 % — ABNORMAL LOW (ref 4.8–5.6)
Mean Plasma Glucose: 85 mg/dL

## 2020-10-14 ENCOUNTER — Other Ambulatory Visit (INDEPENDENT_AMBULATORY_CARE_PROVIDER_SITE_OTHER): Payer: Self-pay | Admitting: Primary Care

## 2020-10-26 ENCOUNTER — Other Ambulatory Visit: Payer: Self-pay | Admitting: Internal Medicine

## 2020-10-26 DIAGNOSIS — Z1231 Encounter for screening mammogram for malignant neoplasm of breast: Secondary | ICD-10-CM

## 2020-11-02 ENCOUNTER — Ambulatory Visit (INDEPENDENT_AMBULATORY_CARE_PROVIDER_SITE_OTHER): Payer: Medicaid Other | Admitting: Primary Care

## 2020-11-13 ENCOUNTER — Other Ambulatory Visit (INDEPENDENT_AMBULATORY_CARE_PROVIDER_SITE_OTHER): Payer: Self-pay | Admitting: Primary Care

## 2020-11-13 DIAGNOSIS — Z79899 Other long term (current) drug therapy: Secondary | ICD-10-CM

## 2020-11-13 NOTE — Telephone Encounter (Signed)
Sent to PCP ?

## 2020-11-23 ENCOUNTER — Ambulatory Visit: Payer: Medicaid Other

## 2020-11-26 ENCOUNTER — Ambulatory Visit (INDEPENDENT_AMBULATORY_CARE_PROVIDER_SITE_OTHER): Payer: Medicaid Other | Admitting: Primary Care

## 2020-12-01 ENCOUNTER — Ambulatory Visit (INDEPENDENT_AMBULATORY_CARE_PROVIDER_SITE_OTHER): Payer: Medicaid Other | Admitting: Primary Care

## 2020-12-12 ENCOUNTER — Other Ambulatory Visit (INDEPENDENT_AMBULATORY_CARE_PROVIDER_SITE_OTHER): Payer: Self-pay | Admitting: Primary Care

## 2020-12-12 NOTE — Telephone Encounter (Signed)
Discontinued 07/27/20. Not on current med profile

## 2021-01-06 ENCOUNTER — Other Ambulatory Visit: Payer: Self-pay | Admitting: Physician Assistant

## 2021-01-06 DIAGNOSIS — Z1231 Encounter for screening mammogram for malignant neoplasm of breast: Secondary | ICD-10-CM

## 2021-02-01 ENCOUNTER — Other Ambulatory Visit (INDEPENDENT_AMBULATORY_CARE_PROVIDER_SITE_OTHER): Payer: Self-pay | Admitting: Primary Care

## 2021-03-15 ENCOUNTER — Other Ambulatory Visit (INDEPENDENT_AMBULATORY_CARE_PROVIDER_SITE_OTHER): Payer: Self-pay | Admitting: Primary Care

## 2021-03-15 DIAGNOSIS — Z79899 Other long term (current) drug therapy: Secondary | ICD-10-CM

## 2021-04-19 ENCOUNTER — Other Ambulatory Visit (INDEPENDENT_AMBULATORY_CARE_PROVIDER_SITE_OTHER): Payer: Self-pay | Admitting: Primary Care

## 2021-05-26 ENCOUNTER — Other Ambulatory Visit (INDEPENDENT_AMBULATORY_CARE_PROVIDER_SITE_OTHER): Payer: Self-pay | Admitting: Primary Care

## 2021-06-22 ENCOUNTER — Other Ambulatory Visit (INDEPENDENT_AMBULATORY_CARE_PROVIDER_SITE_OTHER): Payer: Self-pay | Admitting: Primary Care

## 2021-06-22 DIAGNOSIS — E782 Mixed hyperlipidemia: Secondary | ICD-10-CM

## 2021-06-23 NOTE — Telephone Encounter (Signed)
Requested medication (s) are due for refill today:   No ? ?Requested medication (s) are on the active medication list:   No ? ?Future visit scheduled:   No  Been over a yr since seen last.   Multiple No Shows ? ? ?Last ordered: 07/27/2020 discontinued ? ?Returned because a refill request was received and protocol criteria failed  ? ?Requested Prescriptions  ?Pending Prescriptions Disp Refills  ? atorvastatin (LIPITOR) 20 MG tablet [Pharmacy Med Name: ATORVASTATIN 20MG  TABLETS] 90 tablet 3  ?  Sig: TAKE 1 TABLET(20 MG) BY MOUTH DAILY  ?  ? Cardiovascular:  Antilipid - Statins Failed - 06/22/2021  1:18 PM  ?  ?  Failed - Valid encounter within last 12 months  ?  Recent Outpatient Visits   ? ?      ? 1 year ago Lipid screening  ? [facility], NP  ? 2 years ago Status post fall  ? [facility], NP  ? 2 years ago Pain in joint of left shoulder  ? [facility], NP  ? 2 years ago Osteopenia after menopause  ? [facility], NP  ? 2 years ago Cervical cancer screening  ? [facility], NP  ? ?  ?  ? ? ?  ?  ?  Failed - Lipid Panel in normal range within the last 12 months  ?  Cholesterol, Total  ?Date Value Ref Range Status  ?04/30/2020 240 (H) 100 - 199 mg/dL Final  ? ?Cholesterol  ?Date Value Ref Range Status  ?07/27/2020 235 (H) 0 - 200 mg/dL Final  ? ?LDL Chol Calc (NIH)  ?Date Value Ref Range Status  ?04/30/2020 101 (H) 0 - 99 mg/dL Final  ? ?LDL Cholesterol  ?Date Value Ref Range Status  ?07/27/2020 NOT CALCULATED 0 - 99 mg/dL Final  ?  Comment:  ?  Performed at [facility], [address], [place], [place]  ? ?HDL  ?Date Value Ref Range Status  ?07/27/2020 >135 >40 mg/dL Final  ?  Comment:  ?  REPEATED TO VERIFY  ?04/30/2020 127 >39 mg/dL Final  ? ?Triglycerides  ?Date Value Ref Range Status   ?07/27/2020 38 <150 mg/dL Final  ? ?  ?  ?  Passed - Patient is not pregnant  ?  ?  ? ?

## 2021-08-30 ENCOUNTER — Encounter (HOSPITAL_COMMUNITY): Payer: Self-pay | Admitting: Emergency Medicine

## 2021-08-30 ENCOUNTER — Ambulatory Visit (HOSPITAL_COMMUNITY)
Admission: EM | Admit: 2021-08-30 | Discharge: 2021-08-30 | Disposition: A | Discharge status: Home / Self Care | Payer: Medicaid Other | Attending: Physician Assistant | Admitting: Physician Assistant

## 2021-08-30 ENCOUNTER — Other Ambulatory Visit: Payer: Self-pay

## 2021-08-30 DIAGNOSIS — M79604 Pain in right leg: Secondary | ICD-10-CM | POA: Insufficient documentation

## 2021-08-30 DIAGNOSIS — M79662 Pain in left lower leg: Secondary | ICD-10-CM | POA: Diagnosis not present

## 2021-08-30 DIAGNOSIS — M79661 Pain in right lower leg: Secondary | ICD-10-CM | POA: Insufficient documentation

## 2021-08-30 DIAGNOSIS — M79605 Pain in left leg: Secondary | ICD-10-CM | POA: Insufficient documentation

## 2021-08-30 LAB — COMPREHENSIVE METABOLIC PANEL
ALT: 20 U/L (ref 0–44)
AST: 37 U/L (ref 15–41)
Albumin: 4.5 g/dL (ref 3.5–5.0)
Alkaline Phosphatase: 39 U/L (ref 38–126)
Anion gap: 8 (ref 5–15)
BUN: 6 mg/dL (ref 6–20)
CO2: 23 mmol/L (ref 22–32)
Calcium: 9.8 mg/dL (ref 8.9–10.3)
Chloride: 101 mmol/L (ref 98–111)
Creatinine, Ser: 0.49 mg/dL (ref 0.44–1.00)
GFR, Estimated: >60 mL/min (ref >60)
Glucose, Bld: 78 mg/dL (ref 70–99)
Potassium: 4.5 mmol/L (ref 3.5–5.1)
Sodium: 132 mmol/L — ABNORMAL LOW (ref 135–145)
Total Bilirubin: 0.5 mg/dL (ref 0.3–1.2)
Total Protein: 8.1 g/dL (ref 6.5–8.1)

## 2021-08-30 LAB — SEDIMENTATION RATE: Sed Rate: 24 mm/hr — ABNORMAL HIGH (ref 0–22)

## 2021-08-30 MED ORDER — NAPROXEN 375 MG PO TABS: 375.0000 mg | ORAL_TABLET | Freq: Two times a day (BID) | ORAL | 0 refills | Status: DC

## 2021-08-30 NOTE — ED Provider Notes (Signed)
[facility]    CSN: [id number] Arrival date & time: 08/30/21  8182      History   Chief Complaint Chief Complaint  Patient presents with   Leg Pain    HPI Betty Avery is a 61 y.o. female.   61 year old female presents with 1 months duration of bilateral lower leg pain.  Patient indicates for the past month she has been having pain of both legs from the knees to the feet.  Patient indicates that the pain is worse when she walks, and not relieved with taking Tylenol or Advil.  Patient relates that she has not injured her lower legs, and has no swelling.  Patient relates that she feels the pain in the anterior aspects of both lower legs and also the calf of both lower legs.  Patient indicates that the pain does not go above the knee level, and she occasionally has is intermittent numbness and tingling associated.  Patient relates she is not having any pain in the thighs, and no other pain associated in any large or medium sized joint areas.  Patient denies fever or chills   Leg Pain   Past Medical History:  Diagnosis Date   Chronic constipation    Hypercholesteremia    Hypertension    S/P colonoscopic polypectomy 2013   Wears glasses     Patient Active Problem List   Diagnosis Date Noted   Substance induced mood disorder (HCC) 07/26/2020   Current moderate episode of major depressive disorder (HCC) 05/29/2019   Alcoholism (HCC) 03/21/2018   Hypertension 03/21/2018    Class: Chronic    Past Surgical History:  Procedure Laterality Date   COLONOSCOPY     x2   LEFT HEART CATHETERIZATION WITH CORONARY ANGIOGRAM N/A 02/18/2014   Procedure: LEFT HEART CATHETERIZATION WITH CORONARY ANGIOGRAM;  Surgeon: Robynn Pane, MD;  Location: [facility];  Service: Cardiovascular;  Laterality: N/A;   ORBITAL FRACTURE SURGERY  2001   rt eye-fx-plate    ORIF ANKLE FRACTURE Left 12/20/2016   Procedure: OPEN REDUCTION INTERNAL FIXATION (ORIF) LEFT  ANKLE FRACTURE;  Surgeon:  Sheral Apley, MD;  Location: [facility];  Service: Orthopedics;  Laterality: Left;   TONSILLECTOMY      OB History   No obstetric history on file.      Home Medications    Prior to Admission medications   Medication Sig Start Date End Date Taking? Authorizing Provider  naproxen (NAPROSYN) 375 MG tablet Take 1 tablet (375 mg total) by mouth 2 (two) times daily. 08/30/21  Yes Ellsworth Lennox, PA-C  diclofenac Sodium (VOLTAREN) 1 % GEL APPLY 4 GRAMS TO THE AFFECTED AREA FOUR TIMES DAILY 02/01/21   Grayce Sessions, NP  amLODipine (NORVASC) 2.5 MG tablet Take 1 tablet (2.5 mg total) by mouth daily. 04/30/20 07/27/20  Grayce Sessions, NP  atorvastatin (LIPITOR) 20 MG tablet Take 1 tablet (20 mg total) by mouth daily. 05/06/20 07/27/20  Grayce Sessions, NP  DULoxetine (CYMBALTA) 60 MG capsule Take 1 capsule (60 mg total) by mouth daily. 04/30/20 07/27/20  Grayce Sessions, NP  NITROSTAT 0.4 MG SL tablet Place 0.4 mg under the tongue every 5 (five) minutes as needed for chest pain.  Patient not taking: Reported on 07/27/2020 02/13/14 07/27/20  [provider]    Family History History reviewed. No pertinent family history.  Social History Social History   Tobacco Use   Smoking status: Never   Smokeless tobacco: Never  Vaping Use   Vaping Use: Never used  Substance Use Topics   Alcohol use: Yes    Alcohol/week: 14.0 standard drinks of alcohol    Types: 14 Cans of beer per week    Comment: beer- alcohol level 330 when pt fell   Drug use: No     Allergies   Latex   Review of Systems Review of Systems  Musculoskeletal:  Positive for gait problem (pain of both lower legs).     Physical Exam Triage Vital Signs ED Triage Vitals  Enc Vitals Group     BP 08/30/21 1040 135/78     Pulse Rate 08/30/21 1040 77     Resp 08/30/21 1040 18     Temp 08/30/21 1040 98.6 F (37 C)     Temp Source 08/30/21 1040 Oral     SpO2 08/30/21 1040 97 %      Weight --      Height --      Head Circumference --      Peak Flow --      Pain Score 08/30/21 1037 9     Pain Loc --      Pain Edu? --      Excl. in GC? --    No data found.  Updated Vital Signs BP 135/78 (BP Location: Right Arm)   Pulse 77   Temp 98.6 F (37 C) (Oral)   Resp 18   LMP 02/22/2012   SpO2 97%   Visual Acuity Right Eye Distance:   Left Eye Distance:   Bilateral Distance:    Right Eye Near:   Left Eye Near:    Bilateral Near:     Physical Exam Constitutional:      Appearance: Normal appearance.  Cardiovascular:     Rate and Rhythm: Normal rate and regular rhythm.     Heart sounds: Normal heart sounds.  Pulmonary:     Effort: Pulmonary effort is normal.     Breath sounds: Normal breath sounds and air entry. No wheezing, rhonchi or rales.  Musculoskeletal:     Comments: Bilateral lower legs: The legs have a normal appearance from the knees to the feet, without redness, or swelling.  There is tenderness on palpation of the anterior aspects of both lower legs, and tenderness on palpation of the posterior aspects of the calf bilaterally.  The dorsalis pedis and posterior tibial pulses are normal bilaterally, flexion and extension is normal of both lower extremities without causing pain.  Negative Homans' sign bilaterally.  FROM normal bilaterally.  There is no pain on palpation of the anterior thighs.  Lymphadenopathy:     Cervical: No cervical adenopathy.  Neurological:     Mental Status: She is alert.      UC Treatments / Results  Labs (all labs ordered are listed, but only abnormal results are displayed) Labs Reviewed  COMPREHENSIVE METABOLIC PANEL  SEDIMENTATION RATE    EKG   Radiology No results found.  Procedures Procedures (including critical care time)  Medications Ordered in UC Medications - No data to display  Initial Impression / Assessment and Plan / UC Course  I have reviewed the triage vital signs and the nursing  notes.  Pertinent labs & imaging results that were available during my care of the patient were reviewed by me and considered in my medical decision making (see chart for details).    Plan: 1.  Advised to take Naprosyn 375 mg 1 twice daily with food to see if  this will decrease the pain. 2.  A CMP and sed rate are pending 3.  Advised to follow-up PCP or return to urgent care if symptoms fail to improve. Final Clinical Impressions(s) / UC Diagnoses   Final diagnoses:  Right leg pain  Left leg pain  Bilateral calf pain     Discharge Instructions      Advised to take the Naprosyn 375 mg 1 twice daily to help relieve the pain of the lower legs. Advised to follow-up with PCP or return to urgent care if symptoms fail to improve    ED Prescriptions     Medication Sig Dispense Auth. Provider   naproxen (NAPROSYN) 375 MG tablet Take 1 tablet (375 mg total) by mouth 2 (two) times daily. 20 tablet Ellsworth Lennox, PA-C      PDMP not reviewed this encounter.   Ellsworth Lennox, PA-C 08/30/21 1126

## 2021-08-30 NOTE — Discharge Instructions (Addendum)
Advised to take the Naprosyn 375 mg 1 twice daily to help relieve the pain of the lower legs. Advised to follow-up with PCP or return to urgent care if symptoms fail to improve

## 2021-08-30 NOTE — ED Triage Notes (Signed)
Patient states both calves of legs hurt, but touches hip as location where pains tops.  Denies falls.  Pain started one month ago.  Has tried tylenol, but does not help.  Patient denies back pain.  Patient has used diclofenac cream.  This does not help

## 2021-11-01 ENCOUNTER — Ambulatory Visit (INDEPENDENT_AMBULATORY_CARE_PROVIDER_SITE_OTHER): Payer: Medicaid Other | Admitting: Primary Care

## 2021-11-01 ENCOUNTER — Encounter (INDEPENDENT_AMBULATORY_CARE_PROVIDER_SITE_OTHER): Payer: Self-pay | Admitting: Primary Care

## 2021-11-01 VITALS — BP 129/87 | HR 88 | Resp 16 | Ht 61.0 in | Wt 115.8 lb

## 2021-11-01 DIAGNOSIS — F1020 Alcohol dependence, uncomplicated: Secondary | ICD-10-CM | POA: Diagnosis not present

## 2021-11-01 DIAGNOSIS — E782 Mixed hyperlipidemia: Secondary | ICD-10-CM

## 2021-11-01 DIAGNOSIS — J9801 Acute bronchospasm: Secondary | ICD-10-CM | POA: Diagnosis not present

## 2021-11-01 DIAGNOSIS — Z131 Encounter for screening for diabetes mellitus: Secondary | ICD-10-CM | POA: Diagnosis not present

## 2021-11-01 DIAGNOSIS — I10 Essential (primary) hypertension: Secondary | ICD-10-CM | POA: Diagnosis not present

## 2021-11-01 DIAGNOSIS — Z79899 Other long term (current) drug therapy: Secondary | ICD-10-CM

## 2021-11-01 MED ORDER — ALBUTEROL SULFATE HFA 108 (90 BASE) MCG/ACT IN AERS: 2.0000 | INHALATION_SPRAY | Freq: Four times a day (QID) | RESPIRATORY_TRACT | 2 refills | Status: DC | PRN

## 2021-11-01 NOTE — Progress Notes (Signed)
Betty  Bibiana Avery, is a 61 y.o. female  TOI:[id number]  IPJ:[id number]  DOB - [birth year]  Chief Complaint  Patient presents with   Medication Refill       Subjective:   Betty Avery is a 61 y.o. female here today for a follow up visit. Patient has No headache, No chest pain, No abdominal pain - No Nausea, No new weakness tingling or numbness, No Cough - shortness of breath  No problems updated.  Allergies  Allergen Reactions   Latex Itching    Past Medical History:  Diagnosis Date   Chronic constipation    Hypercholesteremia    Hypertension    S/P colonoscopic polypectomy 2013   Wears glasses     Current Outpatient Medications on File Prior to Visit  Medication Sig Dispense Refill   diclofenac Sodium (VOLTAREN) 1 % GEL APPLY 4 GRAMS TO THE AFFECTED AREA FOUR TIMES DAILY 100 g 1   naproxen (NAPROSYN) 375 MG tablet Take 1 tablet (375 mg total) by mouth 2 (two) times daily. 20 tablet 0   [DISCONTINUED] amLODipine (NORVASC) 2.5 MG tablet Take 1 tablet (2.5 mg total) by mouth daily. 90 tablet 1   [DISCONTINUED] atorvastatin (LIPITOR) 20 MG tablet Take 1 tablet (20 mg total) by mouth daily. 90 tablet 3   [DISCONTINUED] DULoxetine (CYMBALTA) 60 MG capsule Take 1 capsule (60 mg total) by mouth daily. 90 capsule 3   [DISCONTINUED] NITROSTAT 0.4 MG SL tablet Place 0.4 mg under the tongue every 5 (five) minutes as needed for chest pain.  (Patient not taking: Reported on 07/27/2020)  0   No current facility-administered medications on file prior to visit.    Objective:   Vitals:   11/01/21 1021  BP: 129/87  Pulse: 88  Resp: 16  SpO2: 99%  Weight: 115 lb 12.8 oz (52.5 kg)  Height: 5\' 1"  (1.549 m)    Exam General appearance : Awake, alert, not in any distress. Speech Clear. Not toxic looking HEENT: Atraumatic and Normocephalic, pupils equally reactive to light and accomodation Neck: Supple, no JVD. No cervical lymphadenopathy.  Chest: Good air  entry bilaterally, no added sounds  CVS: S1 S2 regular, no murmurs.  Abdomen: Bowel sounds present, Non tender and not distended with no gaurding, rigidity or rebound. Extremities: B/L Lower Ext shows no edema, both legs are warm to touch Neurology: Awake alert, and oriented X 3, CN II-XII intact, Non focal Skin: No Rash  Data Review Lab Results  Component Value Date   HGBA1C 4.6 (L) 07/27/2020    Assessment & Plan   1. Primary hypertension BP goal - < 130/80 Explained that having normal blood pressure is the goal and medications are helping to get to goal and maintain normal blood pressure. DIET: Limit salt intake, read nutrition labels to check salt content, limit fried and high fatty foods  Avoid using multisymptom OTC cold preparations that generally contain sudafed which can rise BP. Consult with pharmacist on best cold relief products to use for persons with HTN EXERCISE Discussed incorporating exercise such as walking - 30 minutes most days of the week and can do in 10 minute intervals    - CBC - Comprehensive metabolic panel  2. Alcoholism ([place]) Hx of   3. Mixed hyperlipidemia  Healthy lifestyle diet of fruits vegetables fish nuts whole grains and low saturated fat . Foods high in cholesterol or liver, fatty meats,cheese, butter avocados, nuts and seeds, chocolate and fried foods. - Lipid panel  4. Medication  management - CBC  5. Screening for diabetes mellitus - Hemoglobin A1c  6. Bronchospasm - albuterol (VENTOLIN HFA) 108 (90 Base) MCG/ACT inhaler; Inhale 2 puffs into the lungs every 6 (six) hours as needed for wheezing or shortness of breath.  Dispense: 8 g; Refill: 2  Patient have been counseled extensively about nutrition and exercise. Other issues discussed during this visit include: low cholesterol diet, weight control and daily exercise, foot care, annual eye examinations at Ophthalmology, importance of adherence with medications and regular follow-up. We  also discussed long term complications of uncontrolled diabetes and hypertension.   Return in about 3 months (around 01/31/2022).  The patient was given clear instructions to go to ER or return to medical center if symptoms don't improve, worsen or new problems develop. The patient verbalized understanding. The patient was told to call to get lab results if they haven't heard anything in the next week.   This note has been created with Education officer, environmental. Any transcriptional errors are unintentional.   Grayce Sessions, NP 11/01/2021, 2:36 PM

## 2021-11-02 LAB — COMPREHENSIVE METABOLIC PANEL
ALT: 10 IU/L (ref 0–32)
AST: 21 IU/L (ref 0–40)
Albumin/Globulin Ratio: 1.6 (ref 1.2–2.2)
Albumin: 5 g/dL — ABNORMAL HIGH (ref 3.8–4.9)
Alkaline Phosphatase: 59 IU/L (ref 44–121)
BUN/Creatinine Ratio: 23 (ref 9–23)
BUN: 13 mg/dL (ref 6–24)
Bilirubin Total: 0.5 mg/dL (ref 0.0–1.2)
CO2: 23 mmol/L (ref 20–29)
Calcium: 10.7 mg/dL — ABNORMAL HIGH (ref 8.7–10.2)
Chloride: 97 mmol/L (ref 96–106)
Creatinine, Ser: 0.57 mg/dL (ref 0.57–1.00)
Globulin, Total: 3.1 g/dL (ref 1.5–4.5)
Glucose: 103 mg/dL — ABNORMAL HIGH (ref 70–99)
Potassium: 4.6 mmol/L (ref 3.5–5.2)
Sodium: 136 mmol/L (ref 134–144)
Total Protein: 8.1 g/dL (ref 6.0–8.5)
eGFR: 105 mL/min/{1.73_m2} (ref >59)

## 2021-11-02 LAB — HEMOGLOBIN A1C
Est. average glucose Bld gHb Est-mCnc: 103 mg/dL
Hgb A1c MFr Bld: 5.2 % (ref 4.8–5.6)

## 2021-11-02 LAB — CBC
Hematocrit: 38.5 % (ref 34.0–46.6)
Hemoglobin: 12.5 g/dL (ref 11.1–15.9)
MCH: 29.3 pg (ref 26.6–33.0)
MCHC: 32.5 g/dL (ref 31.5–35.7)
MCV: 90 fL (ref 79–97)
Platelets: 339 10*3/uL (ref 150–450)
RBC: 4.26 x10E6/uL (ref 3.77–5.28)
RDW: 12.6 % (ref 11.7–15.4)
WBC: 5.1 10*3/uL (ref 3.4–10.8)

## 2021-11-02 LAB — LIPID PANEL
Chol/HDL Ratio: 1.7 ratio (ref 0.0–4.4)
Cholesterol, Total: 229 mg/dL — ABNORMAL HIGH (ref 100–199)
HDL: 133 mg/dL (ref >39)
LDL Chol Calc (NIH): 84 mg/dL (ref 0–99)
Triglycerides: 68 mg/dL (ref 0–149)
VLDL Cholesterol Cal: 12 mg/dL (ref 5–40)

## 2021-11-06 ENCOUNTER — Other Ambulatory Visit (INDEPENDENT_AMBULATORY_CARE_PROVIDER_SITE_OTHER): Payer: Self-pay | Admitting: Primary Care

## 2021-11-06 DIAGNOSIS — Z79899 Other long term (current) drug therapy: Secondary | ICD-10-CM

## 2021-11-07 ENCOUNTER — Other Ambulatory Visit (INDEPENDENT_AMBULATORY_CARE_PROVIDER_SITE_OTHER): Payer: Self-pay | Admitting: Primary Care

## 2021-11-07 DIAGNOSIS — E782 Mixed hyperlipidemia: Secondary | ICD-10-CM

## 2021-11-07 MED ORDER — ATORVASTATIN CALCIUM 20 MG PO TABS: 20.0000 mg | ORAL_TABLET | Freq: Every day | ORAL | 3 refills | Status: AC

## 2021-11-08 NOTE — Telephone Encounter (Signed)
Requested medications are due for refill today.  unsure  Requested medications are on the active medications list.  no  Last refill. 04/30/2020 #4 11 refills  Future visit scheduled.   yes  Notes to clinic.  Medication was d/c'd 07/27/2020. No PCP listed.    Requested Prescriptions  Pending Prescriptions Disp Refills   alendronate (FOSAMAX) 70 MG tablet [Pharmacy Med Name: ALENDRONATE $RemoveBeforeDEI'70MG'[id number]$  TABLETS] 4 tablet 11    Sig: TAKE 1 TABLET(70 MG) BY MOUTH EVERY 7 DAYS WITH A FULL GLASS OF WATER AND ON AN EMPTY STOMACH     Endocrinology:  Bisphosphonates Failed - 11/06/2021  3:18 PM      Failed - Ca in normal range and within 360 days    Calcium  Date Value Ref Range Status  11/01/2021 10.7 (H) 8.7 - 10.2 mg/dL Final   Calcium, Ion  Date Value Ref Range Status  02/18/2012 1.18 1.12 - 1.23 mmol/L Final         Failed - Vitamin D in normal range and within 360 days    No results found for: "[id number]", "QI3474QV9", "VD125OH2TOT", "25OHVITD3", "25OHVITD2", "25OHVITD1", "VD25OH"       Failed - Mg Level in normal range and within 360 days    No results found for: "MG"       Failed - Phosphate in normal range and within 360 days    No results found for: "PHOS"       Failed - Bone Mineral Density or Dexa Scan completed in the last 2 years      Passed - Cr in normal range and within 360 days    Creatinine, Ser  Date Value Ref Range Status  11/01/2021 0.57 0.57 - 1.00 mg/dL Final         Passed - eGFR is 30 or above and within 360 days    GFR calc Af Amer  Date Value Ref Range Status  05/29/2019 116 >59 mL/min/1.73 Final   GFR, Estimated  Date Value Ref Range Status  08/30/2021 >60 >60 mL/min Final    Comment:    (NOTE) Calculated using the CKD-EPI Creatinine Equation (2021)    eGFR  Date Value Ref Range Status  11/01/2021 105 >59 mL/min/1.73 Final         Passed - Valid encounter within last 12 months    Recent Outpatient Visits           1 week ago Primary  hypertension   [place], Michelle P, NP   1 year ago Lipid screening   [place], [place], NP   2 years ago Status post fall   [place], Michelle P, NP   2 years ago Pain in joint of left shoulder   [place], Michelle P, NP   2 years ago Osteopenia after menopause   [place], [place], NP       Future Appointments             In 2 months Oletta Lamas, [place] Cage, NP [place]

## 2021-11-09 ENCOUNTER — Telehealth (INDEPENDENT_AMBULATORY_CARE_PROVIDER_SITE_OTHER): Payer: Self-pay

## 2021-11-09 NOTE — Telephone Encounter (Signed)
Contacted pt to go over lab results pt is aware. Pt only concern is she didn't receive rx for her bone pill.

## 2021-11-10 ENCOUNTER — Other Ambulatory Visit (INDEPENDENT_AMBULATORY_CARE_PROVIDER_SITE_OTHER): Payer: Self-pay | Admitting: Primary Care

## 2021-11-10 MED ORDER — ALENDRONATE SODIUM 70 MG PO TABS: 70.0000 mg | ORAL_TABLET | ORAL | 11 refills | Status: DC

## 2021-11-12 NOTE — Telephone Encounter (Signed)
Contacted pt to make aware of provider message pt doesn't have any questions or concerns

## 2021-11-22 ENCOUNTER — Other Ambulatory Visit (INDEPENDENT_AMBULATORY_CARE_PROVIDER_SITE_OTHER): Payer: Self-pay | Admitting: Primary Care

## 2021-11-23 NOTE — Telephone Encounter (Signed)
Will forward to provider  

## 2021-11-23 NOTE — Telephone Encounter (Signed)
Requested medication (s) are due for refill today- no  Requested medication (s) are on the active medication list -no  Future visit scheduled -yes  Last refill: 05/16/21  Notes to clinic: non delegated Rx- no longer on current medication list  Requested Prescriptions  Pending Prescriptions Disp Refills   megestrol (MEGACE) 20 MG tablet [Pharmacy Med Name: MEGESTROL ACETATE 20MG  TABLETS] 30 tablet 5    Sig: TAKE 1 TABLET(20 MG) BY MOUTH DAILY     Not Delegated - OB/GYN:  Progestins - megestrol acetate Failed - 11/22/2021  1:05 PM      Failed - This refill cannot be delegated      Passed - Last BP in normal range    BP Readings from Last 1 Encounters:  11/01/21 129/87         Passed - Valid encounter within last 12 months    Recent Outpatient Visits           3 weeks ago Primary hypertension   [place] Kerin Perna, NP   1 year ago Lipid screening   [place], [place], NP   2 years ago Status post fall   [place], Michelle P, NP   2 years ago Pain in joint of left shoulder   [place], Michelle P, NP   2 years ago Osteopenia after menopause   [place], [place], NP       Future Appointments             In 2 months Oletta Lamas, [place] Cage, NP [place]               Requested Prescriptions  Pending Prescriptions Disp Refills   megestrol (MEGACE) 20 MG tablet [Pharmacy Med Name: MEGESTROL ACETATE 20MG  TABLETS] 30 tablet 5    Sig: TAKE 1 TABLET(20 MG) BY MOUTH DAILY     Not Delegated - OB/GYN:  Progestins - megestrol acetate Failed - 11/22/2021  1:05 PM      Failed - This refill cannot be delegated      Passed - Last BP in normal range    BP Readings from Last 1 Encounters:  11/01/21 129/87         Passed - Valid encounter within last 12 months    Recent Outpatient  Visits           3 weeks ago Primary hypertension   [place] Kerin Perna, NP   1 year ago Lipid screening   [place], [place], NP   2 years ago Status post fall   [place], Michelle P, NP   2 years ago Pain in joint of left shoulder   [place] Kerin Perna, NP   2 years ago Osteopenia after menopause   [place] Kerin Perna, NP       Future Appointments             In 2 months Oletta Lamas, Mil[place] Cage, NP [place]

## 2022-01-31 ENCOUNTER — Ambulatory Visit (INDEPENDENT_AMBULATORY_CARE_PROVIDER_SITE_OTHER): Payer: Medicaid Other | Admitting: Primary Care

## 2022-02-27 ENCOUNTER — Other Ambulatory Visit (INDEPENDENT_AMBULATORY_CARE_PROVIDER_SITE_OTHER): Payer: Self-pay | Admitting: Primary Care

## 2022-04-27 ENCOUNTER — Other Ambulatory Visit (INDEPENDENT_AMBULATORY_CARE_PROVIDER_SITE_OTHER): Payer: Self-pay | Admitting: Primary Care

## 2022-06-10 ENCOUNTER — Other Ambulatory Visit: Payer: Self-pay | Admitting: Physician Assistant

## 2022-06-10 DIAGNOSIS — Z1231 Encounter for screening mammogram for malignant neoplasm of breast: Secondary | ICD-10-CM

## 2022-06-28 ENCOUNTER — Ambulatory Visit (HOSPITAL_COMMUNITY)
Admission: EM | Admit: 2022-06-28 | Discharge: 2022-06-28 | Disposition: A | Discharge status: Home / Self Care | Payer: Medicaid Other | Attending: Psychiatry | Admitting: Psychiatry

## 2022-06-28 DIAGNOSIS — F69 Unspecified disorder of adult personality and behavior: Secondary | ICD-10-CM

## 2022-06-28 DIAGNOSIS — F319 Bipolar disorder, unspecified: Secondary | ICD-10-CM | POA: Insufficient documentation

## 2022-06-28 DIAGNOSIS — F411 Generalized anxiety disorder: Secondary | ICD-10-CM | POA: Insufficient documentation

## 2022-06-28 DIAGNOSIS — F1010 Alcohol abuse, uncomplicated: Secondary | ICD-10-CM | POA: Insufficient documentation

## 2022-06-28 DIAGNOSIS — F209 Schizophrenia, unspecified: Secondary | ICD-10-CM | POA: Insufficient documentation

## 2022-06-28 DIAGNOSIS — F339 Major depressive disorder, recurrent, unspecified: Secondary | ICD-10-CM

## 2022-06-28 NOTE — Discharge Instructions (Signed)
F/u with outpatient resources  ?

## 2022-06-28 NOTE — Progress Notes (Signed)
   06/28/22 1951  [facility] Triage Screening (Walk-ins at [facility] only)  How Did You Hear About [place]? Family/Friend  What Is the Reason for Your Visit/Call Today? Pt presents to [facility] voluntarily, accompanied by her daughter due to alcohol use and depression. Pt immediately stated " I need help". Per daughter, pt called her and asked to come to [facility] to be evaluated. Pt reports drinking 2 large 24oz beers and 2 smaller cans of beer earlier today. Pt reports history of depression and bipolar. Pt is unable to remember medications that she is prescribed, but did state that she is not always consistent. Pt denies SI,HI, AVH.  How Long Has This Been Causing You Problems? <Week  Have You Recently Had Any Thoughts About Hurting Yourself? No  Are You Planning to Commit Suicide/Harm Yourself At This time? No  Have you Recently Had Thoughts About Hurting Someone Karolee Ohs? No  Are You Planning To Harm Someone At This Time? No  Are you currently experiencing any auditory, visual or other hallucinations? No  Have You Used Any Alcohol or Drugs in the Past 24 Hours? Yes  How long ago did you use Drugs or Alcohol? earlier today  What Did You Use and How Much? 4 beers  Do you have any current medical co-morbidities that require immediate attention? No (pt reports COPD, Diabetes, heart condition)  Clinician description of patient physical appearance/behavior: calm, cooperative, but tearful at times  What Do You Feel Would Help You the Most Today? Treatment for Depression or other mood problem;Alcohol or Drug Use Treatment  If access to [facility] was not available, would you have sought care in the Emergency Department? No  Determination of Need Urgent (48 hours)  Options For Referral Other: Comment;BH Urgent Care;Outpatient Therapy;Medication Management;Facility-Based Crisis

## 2022-06-28 NOTE — ED Provider Notes (Signed)
Behavioral Health Urgent Care Medical Screening Exam  Patient Name: Betty Avery MRN: [id number] Date of Evaluation: 06/29/22 Chief Complaint:   Diagnosis:  Final diagnoses:  Alcohol abuse  Recurrent major depressive disorder, remission status unspecified (HCC)  Generalized anxiety disorder  Behavior concern in adult    History of Present illness: Betty Avery is a 62 y.o. female. With a history of alcohol abuse, GAD, MDD, bipolar disorder, unconfirmed schizophrenia.  Presented to [facility] accompanied by her daughter.  Per the patient she came because she needed help with alcohol and depression.  According to patient she is drinking 3-4 beers daily she has been drinking since age 51.  Patient is currently not seeing a psychiatrist or therapist however patient is prescribed medication for anxiety and depression but stated she only takes it when she feels like it.  According to patient she lives alone but her daughter's checkup on her every day.  Discussed with patient [facility] admission however patient stated she does not want to stay here, according to patient " I am not staying here, I have my doctor's appointments and I am supposed to do surgery and I do not want to be here.  Patient's daughter tried to convince patient to stay however patient is adamant that she is not staying in the facility because patient states she is unable to stay for 1 day.  According to patient's daughter they were looking into getting her into a facility for long-term at least 30 days or more but according to patient's daughter they have checked out a couple facility but is still waiting to make a decision.  Patient denies access to guns or weapons.  Face-to-face observation of patient, patient is alert and oriented x 4, speech is clear, maintaining eye contact.  Patient denies SI, HI, AVH or paranoia at this time.  Patient denies illicit drug use.  According to patient she drinks 3-4 beers daily has been doing it for  years.  Patient does not seem to want to be in a rehab or does not seem to want to stop and it does appear that patient only came because her daughter made her calm.  However patient verbalized that she is not staying here and she came freely so she can leave.  During the interview process patient and her daughter been going back and forth because patient is adamant she is not staying here.  Patient does not seem to be influenced by external or internal stimuli at this time.  Collaborate with social work to provide patient with outpatient resources so she can try outpatient detox.  However patient is made aware that she can return at any time she is ready to start the detox process. Patient is educated and verbalized understanding of mental health resources and other crisis services in the community.  She is instructed to call 911 and present to the nearest emergency room should she experience any suicidal or homicidal or visual or auditory hallucination or any worsening of her mental health condition.  Patient was given given needed resources for community services and psychiatric outpatient services.   Recommend discharge the patient to follow-up with outpatient resources provided to her.  Flowsheet Row ED from 06/28/2022 in [facility] ED from 08/30/2021 in [facility] at [facility] ED from 07/26/2020 in [facility]  C-SSRS RISK CATEGORY No Risk No Risk No Risk       Psychiatric Specialty Exam  Presentation  General Appearance:Casual  Eye Contact:Good  Speech:Clear and Coherent  Speech Volume:No data recorded Handedness:Right   Mood and Affect  Mood: Anxious  Affect: Blunt   Thought Process  Thought Processes: Coherent  Descriptions of Associations:Circumstantial  Orientation:Full (Time, Place and Person)  Thought Content:Abstract Reasoning  Diagnosis of Schizophrenia or Schizoaffective disorder in past: No  data recorded  Hallucinations:None  Ideas of Reference:None  Suicidal Thoughts:No  Homicidal Thoughts:No   Sensorium  Memory: Immediate Fair  Judgment: Poor  Insight: Fair   Art therapist  Concentration: Fair  Attention Span: Fair  Recall: Fair  Fund of Knowledge: Good  Language: Good   Psychomotor Activity  Psychomotor Activity: Normal   Assets  Assets: Desire for Improvement; Physical Health; Resilience   Sleep  Sleep: Poor  Number of hours:  4   Physical Exam: Physical Exam HENT:     Head: Normocephalic.     Nose: Nose normal.  Cardiovascular:     Rate and Rhythm: Normal rate.  Pulmonary:     Effort: Pulmonary effort is normal.  Musculoskeletal:        General: Normal range of motion.     Cervical back: Normal range of motion.  Neurological:     General: No focal deficit present.     Mental Status: She is alert.  Psychiatric:        Mood and Affect: Mood normal.        Behavior: Behavior normal.        Thought Content: Thought content normal.        Judgment: Judgment normal.    Review of Systems  Constitutional: Negative.   HENT: Negative.    Eyes: Negative.   Respiratory: Negative.    Cardiovascular: Negative.   Gastrointestinal: Negative.   Genitourinary: Negative.   Musculoskeletal: Negative.   Skin: Negative.   Neurological: Negative.   Psychiatric/Behavioral:  Positive for substance abuse. The patient is nervous/anxious and has insomnia.    Blood pressure (!) 152/82, pulse 76, temperature 97.9 F (36.6 C), temperature source Oral, resp. rate 20, last menstrual period 02/22/2012, SpO2 100 %. There is no height or weight on file to calculate BMI.  Musculoskeletal: Strength & Muscle Tone: within normal limits Gait & Station: normal Patient leans: N/A   [facility] MSE Discharge Disposition for Follow up and Recommendations: Based on my evaluation the patient does not appear to have an emergency medical condition  and can be discharged with resources and follow up care in outpatient services for Substance Abuse Intensive Outpatient Program   Sindy Guadeloupe, NP 06/29/2022, 5:59 AM

## 2022-07-29 ENCOUNTER — Encounter (HOSPITAL_COMMUNITY): Payer: Self-pay

## 2022-07-29 ENCOUNTER — Ambulatory Visit (HOSPITAL_COMMUNITY)
Admission: EM | Admit: 2022-07-29 | Discharge: 2022-07-29 | Disposition: A | Discharge status: Home / Self Care | Payer: Medicaid Other | Attending: Emergency Medicine | Admitting: Emergency Medicine

## 2022-07-29 DIAGNOSIS — L03011 Cellulitis of right finger: Secondary | ICD-10-CM | POA: Diagnosis present

## 2022-07-29 DIAGNOSIS — N898 Other specified noninflammatory disorders of vagina: Secondary | ICD-10-CM

## 2022-07-29 MED ORDER — DOXYCYCLINE HYCLATE 100 MG PO CAPS: 100.0000 mg | ORAL_CAPSULE | Freq: Two times a day (BID) | ORAL | 0 refills | Status: AC

## 2022-07-29 MED ORDER — IBUPROFEN 800 MG PO TABS: 800.0000 mg | ORAL_TABLET | Freq: Three times a day (TID) | ORAL | 0 refills | Status: AC

## 2022-07-29 NOTE — Discharge Instructions (Addendum)
For the finger infection, please take antibiotic (doxycycline) as prescribed. Take with food to avoid upset stomach. This will help with the infection that is causing swelling, pain, and the white spots you saw by the nail.  I have sent the prescription strength ibuprofen to your pharmacy.  You can take every 6 hours as needed for pain.  We will call you if anything on your swab returns positive. This can take 1-2 days to result.

## 2022-07-29 NOTE — ED Provider Notes (Signed)
[facility]    CSN: [id number] Arrival date & time: 07/29/22  1139     History   Chief Complaint Chief Complaint  Patient presents with   Hand Pain    HPI Betty Avery is a 62 y.o. female.  Here with 1 week history of swelling and pain around right pinky fingernail. Rates pain 9/10. Noticed some discoloration of the skin that concerned her. No drainage or bleeding. Does not bite her nails.  No injury or trauma.   Some vaginal itching on and off for a few days. No discharge. She is not currently sexually active. History of yeast infection. No bleeding or spotting. Postmenopausal   Past Medical History:  Diagnosis Date   Chronic constipation    Hypercholesteremia    Hypertension    S/P colonoscopic polypectomy 2013   Wears glasses     Patient Active Problem List   Diagnosis Date Noted   Substance induced mood disorder (HCC) 07/26/2020   Current moderate episode of major depressive disorder (HCC) 05/29/2019   Alcoholism (HCC) 03/21/2018   Hypertension 03/21/2018    Class: Chronic    Past Surgical History:  Procedure Laterality Date   COLONOSCOPY     x2   LEFT HEART CATHETERIZATION WITH CORONARY ANGIOGRAM N/A 02/18/2014   Procedure: LEFT HEART CATHETERIZATION WITH CORONARY ANGIOGRAM;  Surgeon: Robynn Pane, MD;  Location: [facility];  Service: Cardiovascular;  Laterality: N/A;   ORBITAL FRACTURE SURGERY  2001   rt eye-fx-plate    ORIF ANKLE FRACTURE Left 12/20/2016   Procedure: OPEN REDUCTION INTERNAL FIXATION (ORIF) LEFT  ANKLE FRACTURE;  Surgeon: Sheral Apley, MD;  Location: [facility];  Service: Orthopedics;  Laterality: Left;   TONSILLECTOMY      OB History   No obstetric history on file.      Home Medications    Prior to Admission medications   Medication Sig Start Date End Date Taking? Authorizing Provider  doxycycline (VIBRAMYCIN) 100 MG capsule Take 1 capsule (100 mg total) by mouth 2 (two) times daily for 5 days.  07/29/22 08/03/22 Yes Analeia Ismael, Lurena Joiner, PA-C  ibuprofen (ADVIL) 800 MG tablet Take 1 tablet (800 mg total) by mouth 3 (three) times daily. 07/29/22  Yes Aadarsh Cozort, Lurena Joiner, PA-C  albuterol (VENTOLIN HFA) 108 (90 Base) MCG/ACT inhaler Inhale 2 puffs into the lungs every 6 (six) hours as needed for wheezing or shortness of breath. 11/01/21   Grayce Sessions, NP  alendronate (FOSAMAX) 70 MG tablet Take 1 tablet (70 mg total) by mouth every 7 (seven) days. Take with a full glass of water on an empty stomach. 11/10/21   Grayce Sessions, NP  atorvastatin (LIPITOR) 20 MG tablet Take 1 tablet (20 mg total) by mouth daily. 11/07/21   Grayce Sessions, NP  amLODipine (NORVASC) 2.5 MG tablet Take 1 tablet (2.5 mg total) by mouth daily. 04/30/20 07/27/20  Grayce Sessions, NP  DULoxetine (CYMBALTA) 60 MG capsule Take 1 capsule (60 mg total) by mouth daily. 04/30/20 07/27/20  Grayce Sessions, NP  NITROSTAT 0.4 MG SL tablet Place 0.4 mg under the tongue every 5 (five) minutes as needed for chest pain.  Patient not taking: Reported on 07/27/2020 02/13/14 07/27/20  [provider]    Family History History reviewed. No pertinent family history.  Social History Social History   Tobacco Use   Smoking status: Never   Smokeless tobacco: Never  Vaping Use   Vaping Use: Never used  Substance  Use Topics   Alcohol use: Yes    Alcohol/week: 14.0 standard drinks of alcohol    Types: 14 Cans of beer per week    Comment: beer- alcohol level 330 when pt fell   Drug use: No     Allergies   Latex   Review of Systems Review of Systems As per HPI  Physical Exam Triage Vital Signs ED Triage Vitals  Enc Vitals Group     BP 07/29/22 1200 119/75     Pulse Rate 07/29/22 1200 79     Resp 07/29/22 1200 20     Temp 07/29/22 1200 (!) 97.4 F (36.3 C)     Temp src --      SpO2 07/29/22 1200 98 %     Weight --      Height --      Head Circumference --      Peak Flow --      Pain Score 07/29/22  1159 9     Pain Loc --      Pain Edu? --      Excl. in GC? --    No data found.  Updated Vital Signs BP 119/75   Pulse 79   Temp (!) 97.4 F (36.3 C)   Resp 20   LMP 02/22/2012   SpO2 98%   Physical Exam Vitals and nursing note reviewed.  Constitutional:      General: She is not in acute distress. HENT:     Mouth/Throat:     Pharynx: Oropharynx is clear.  Cardiovascular:     Rate and Rhythm: Normal rate and regular rhythm.     Pulses: Normal pulses.  Pulmonary:     Effort: Pulmonary effort is normal.  Musculoskeletal:        General: Swelling and tenderness present.     Cervical back: Normal range of motion.     Comments: Swelling around distal pinky finger, right hand. Mildly erythematous with one small area of pus. No fluctuance. Exam is limited due to patient discomfort.  No nail damage. Full ROM of other fingers and wrist. Cannot assess cap refill of pinky finger due to pain  Neurological:     Mental Status: She is alert and oriented to person, place, and time.      UC Treatments / Results  Labs (all labs ordered are listed, but only abnormal results are displayed) Labs Reviewed  CERVICOVAGINAL ANCILLARY ONLY    EKG  Radiology No results found.  Procedures Procedures (including critical care time)  Medications Ordered in UC Medications - No data to display  Initial Impression / Assessment and Plan / UC Course  I have reviewed the triage vital signs and the nursing notes.  Pertinent labs & imaging results that were available during my care of the patient were reviewed by me and considered in my medical decision making (see chart for details).  Right pinky finger paronychia. Small area of pus but firm, not fluctuant. Cannot drain in clinic. Augmentin BID x 5 days given distal swelling and erythema. Kidney function reviewed and normal. Ibuprofen for pain control. Patient was requesting narcotics, discussed not warranted. Cytology swab pending for  BV/yeast. Treat positive as indicated. No questions at this time  Final Clinical Impressions(s) / UC Diagnoses   Final diagnoses:  Paronychia of finger of right hand  Vaginal itching     Discharge Instructions      For the finger infection, please take antibiotic (doxycycline) as prescribed. Take with food to  avoid upset stomach. This will help with the infection that is causing swelling, pain, and the white spots you saw by the nail.  I have sent the prescription strength ibuprofen to your pharmacy.  You can take every 6 hours as needed for pain.  We will call you if anything on your swab returns positive. This can take 1-2 days to result.      ED Prescriptions     Medication Sig Dispense Auth. Provider   ibuprofen (ADVIL) 800 MG tablet Take 1 tablet (800 mg total) by mouth 3 (three) times daily. 21 tablet Ziare Orrick, PA-C   doxycycline (VIBRAMYCIN) 100 MG capsule Take 1 capsule (100 mg total) by mouth 2 (two) times daily for 5 days. 10 capsule Jadeyn Hargett, Lurena Joiner, PA-C      PDMP not reviewed this encounter.   Kathrine Haddock 07/29/22 1242

## 2022-07-29 NOTE — ED Triage Notes (Signed)
[facility] to [facility] with co of right pinky finger swelling and pain at nail. And left middle finger swelling in the second joint for 2 weeks.

## 2022-08-01 LAB — CERVICOVAGINAL ANCILLARY ONLY
Bacterial Vaginitis (gardnerella): POSITIVE — AB
Candida Glabrata: NEGATIVE
Candida Vaginitis: NEGATIVE
Comment: NEGATIVE
Comment: NEGATIVE
Comment: NEGATIVE

## 2022-08-02 ENCOUNTER — Telehealth: Payer: Self-pay | Admitting: Emergency Medicine

## 2022-08-02 MED ORDER — METRONIDAZOLE 0.75 % VA GEL: 1.0000 | Freq: Every day | VAGINAL | 0 refills | Status: AC

## 2022-08-02 NOTE — Telephone Encounter (Signed)
Per protocol pt needs tx with metronidazole. Reviewed with patient, verified pharmacy, prescription sent.

## 2022-10-05 ENCOUNTER — Emergency Department (HOSPITAL_BASED_OUTPATIENT_CLINIC_OR_DEPARTMENT_OTHER): Payer: Medicaid Other | Admitting: Radiology

## 2022-10-05 ENCOUNTER — Encounter (HOSPITAL_BASED_OUTPATIENT_CLINIC_OR_DEPARTMENT_OTHER): Payer: Self-pay | Admitting: Emergency Medicine

## 2022-10-05 ENCOUNTER — Emergency Department (HOSPITAL_BASED_OUTPATIENT_CLINIC_OR_DEPARTMENT_OTHER)
Admission: EM | Admit: 2022-10-05 | Discharge: 2022-10-05 | Disposition: A | Discharge status: Home / Self Care | Payer: Medicaid Other | Attending: Emergency Medicine | Admitting: Emergency Medicine

## 2022-10-05 DIAGNOSIS — Z20822 Contact with and (suspected) exposure to covid-19: Secondary | ICD-10-CM | POA: Insufficient documentation

## 2022-10-05 DIAGNOSIS — Z91040 Latex allergy status: Secondary | ICD-10-CM | POA: Diagnosis not present

## 2022-10-05 DIAGNOSIS — E871 Hypo-osmolality and hyponatremia: Secondary | ICD-10-CM | POA: Diagnosis not present

## 2022-10-05 DIAGNOSIS — I10 Essential (primary) hypertension: Secondary | ICD-10-CM | POA: Diagnosis not present

## 2022-10-05 DIAGNOSIS — W19XXXA Unspecified fall, initial encounter: Secondary | ICD-10-CM | POA: Diagnosis not present

## 2022-10-05 DIAGNOSIS — S299XXA Unspecified injury of thorax, initial encounter: Secondary | ICD-10-CM | POA: Diagnosis present

## 2022-10-05 DIAGNOSIS — S29011A Strain of muscle and tendon of front wall of thorax, initial encounter: Secondary | ICD-10-CM | POA: Insufficient documentation

## 2022-10-05 DIAGNOSIS — R0789 Other chest pain: Secondary | ICD-10-CM

## 2022-10-05 LAB — CBC
HCT: 34.8 % — ABNORMAL LOW (ref 36.0–46.0)
Hemoglobin: 12.4 g/dL (ref 12.0–15.0)
MCH: 29.7 pg (ref 26.0–34.0)
MCHC: 35.6 g/dL (ref 30.0–36.0)
MCV: 83.3 fL (ref 80.0–100.0)
Platelets: 207 10*3/uL (ref 150–400)
RBC: 4.18 MIL/uL (ref 3.87–5.11)
RDW: 13.1 % (ref 11.5–15.5)
WBC: 3.9 10*3/uL — ABNORMAL LOW (ref 4.0–10.5)
nRBC: 0 % (ref 0.0–0.2)

## 2022-10-05 LAB — ETHANOL: Alcohol, Ethyl (B): <10 mg/dL (ref <10)

## 2022-10-05 LAB — RESP PANEL BY RT-PCR (RSV, FLU A&B, COVID)  RVPGX2
Influenza A by PCR: NEGATIVE
Influenza B by PCR: NEGATIVE
Resp Syncytial Virus by PCR: NEGATIVE
SARS Coronavirus 2 by RT PCR: NEGATIVE

## 2022-10-05 LAB — TROPONIN I (HIGH SENSITIVITY): Troponin I (High Sensitivity): 5 ng/L (ref <18)

## 2022-10-05 LAB — BASIC METABOLIC PANEL
Anion gap: 13 (ref 5–15)
BUN: 12 mg/dL (ref 6–20)
CO2: 25 mmol/L (ref 22–32)
Calcium: 10 mg/dL (ref 8.9–10.3)
Chloride: 88 mmol/L — ABNORMAL LOW (ref 98–111)
Creatinine, Ser: 0.75 mg/dL (ref 0.44–1.00)
GFR, Estimated: >60 mL/min (ref >60)
Glucose, Bld: 93 mg/dL (ref 70–99)
Potassium: 4.3 mmol/L (ref 3.5–5.1)
Sodium: 126 mmol/L — ABNORMAL LOW (ref 135–145)

## 2022-10-05 MED ORDER — KETOROLAC TROMETHAMINE 15 MG/ML IJ SOLN: 15.0000 mg | Freq: Once | INTRAMUSCULAR | Status: DC

## 2022-10-05 MED ORDER — LIDOCAINE 5 % EX PTCH
1.0000 | MEDICATED_PATCH | CUTANEOUS | Status: DC
  Administered 2022-10-05: 1 via TRANSDERMAL
  Filled 2022-10-05: qty 1

## 2022-10-05 MED ORDER — METHOCARBAMOL 500 MG PO TABS: 500.0000 mg | ORAL_TABLET | Freq: Two times a day (BID) | ORAL | 0 refills | Status: AC

## 2022-10-05 MED ORDER — METHOCARBAMOL 500 MG PO TABS
500.0000 mg | ORAL_TABLET | Freq: Once | ORAL | Status: AC
  Administered 2022-10-05: 500 mg via ORAL
  Filled 2022-10-05: qty 1

## 2022-10-05 MED ORDER — SODIUM CHLORIDE 0.9 % IV BOLUS
1000.0000 mL | Freq: Once | INTRAVENOUS | Status: AC
  Administered 2022-10-05: 1000 mL via INTRAVENOUS

## 2022-10-05 MED ORDER — HYDROMORPHONE HCL 1 MG/ML IJ SOLN: 1.0000 mg | Freq: Once | INTRAMUSCULAR | Status: DC

## 2022-10-05 MED ORDER — KETOROLAC TROMETHAMINE 15 MG/ML IJ SOLN
15.0000 mg | Freq: Once | INTRAMUSCULAR | Status: AC
  Administered 2022-10-05: 15 mg via INTRAVENOUS
  Filled 2022-10-05: qty 1

## 2022-10-05 MED ORDER — MORPHINE SULFATE (PF) 4 MG/ML IV SOLN
4.0000 mg | Freq: Once | INTRAVENOUS | Status: AC
  Administered 2022-10-05: 4 mg via INTRAVENOUS
  Filled 2022-10-05: qty 1

## 2022-10-05 NOTE — Discharge Instructions (Addendum)
Please use Tylenol or ibuprofen for pain.  You may use 600 mg ibuprofen every 6 hours or 1000 mg of Tylenol every 6 hours.  You may choose to alternate between the 2.  This would be most effective.  Not to exceed 4 g of Tylenol within 24 hours.  Not to exceed 3200 mg ibuprofen 24 hours.  You can use the muscle relaxant I am prescribing in addition to the above to help with any breakthrough pain.  You can take it up to twice daily.  It is safe to take at night, but I would be cautious taking it during the day as it can cause some drowsiness.  Make sure that you are feeling awake and alert before you get behind the wheel of a car or operate a motor vehicle.  It is not a narcotic pain medication so you are able to take it if it is not making you drowsy and still pilot a vehicle or machinery safely.  You can use the lidocaine patches in addition to the above directly where it hurts for the chest wall and back pain.  Your sodium level was low in the emergency department, make sure that you are drinking plenty of electrolyte containing fluids such as Pedialyte, Gatorade, if you are drinking any alcohol at this time I recommend that you discontinue as this can worsen your low sodium.  Please schedule a follow-up appointment with your primary care doctor to recheck your sodium levels.  I have attached the contact information for an orthopedic physician to follow-up with your chest wall pain continues

## 2022-10-05 NOTE — ED Triage Notes (Signed)
Mid chest pain radiates into left shoulder. Some sob, lightheadedness Woke up feeling chest pain.  Fell 4 days ago, shoulder pain started after fall, chest pain/sob started today

## 2022-10-05 NOTE — ED Provider Notes (Incomplete)
[place] [facility] AT [facility] Provider Note   CSN: [id number] Arrival date & time: 10/05/22  1753     History {Add pertinent medical, surgical, social history, OB history to HPI:1} Chief Complaint  Patient presents with  . Chest Pain    Betty Avery is a 62 y.o. female With past medical history significant for substance-induced mood disorder, alcoholism who presents with concern for an severe left-sided chest pain, shoulder pain for the last several days, worsened today. Patient reports she was lifting a large pot from the stove, she additionally had a fall on the left shoulder and 1 to 2 weeks ago but reports that was not having significant pain at that time. Patient denies any previous history of ACS. She tried some Advil for pain at home without any relief.    Chest Pain      Home Medications Prior to Admission medications   Medication Sig Start Date End Date Taking? Authorizing Provider  methocarbamol (ROBAXIN) 500 MG tablet Take 1 tablet (500 mg total) by mouth 2 (two) times daily. 10/05/22  Yes Ashaki Frosch H, PA-C  albuterol (VENTOLIN HFA) 108 (90 Base) MCG/ACT inhaler Inhale 2 puffs into the lungs every 6 (six) hours as needed for wheezing or shortness of breath. 11/01/21   Grayce Sessions, NP  alendronate (FOSAMAX) 70 MG tablet Take 1 tablet (70 mg total) by mouth every 7 (seven) days. Take with a full glass of water on an empty stomach. 11/10/21   Grayce Sessions, NP  atorvastatin (LIPITOR) 20 MG tablet Take 1 tablet (20 mg total) by mouth daily. 11/07/21   Grayce Sessions, NP  ibuprofen (ADVIL) 800 MG tablet Take 1 tablet (800 mg total) by mouth 3 (three) times daily. 07/29/22   Rising, Lurena Joiner, PA-C  amLODipine (NORVASC) 2.5 MG tablet Take 1 tablet (2.5 mg total) by mouth daily. 04/30/20 07/27/20  Grayce Sessions, NP  DULoxetine (CYMBALTA) 60 MG capsule Take 1 capsule (60 mg total) by mouth daily. 04/30/20 07/27/20  Grayce Sessions, NP  NITROSTAT 0.4 MG SL tablet Place 0.4 mg under the tongue every 5 (five) minutes as needed for chest pain.  Patient not taking: Reported on 07/27/2020 02/13/14 07/27/20  [provider]      Allergies    Latex    Review of Systems   Review of Systems  Cardiovascular:  Positive for chest pain.  All other systems reviewed and are negative.   Physical Exam Updated Vital Signs BP (!) 158/76   Pulse 78   Temp 98.9 F (37.2 C)   Resp 16   LMP 02/22/2012   SpO2 100%  Physical Exam Vitals and nursing note reviewed.  Constitutional:      General: She is not in acute distress.    Appearance: Normal appearance.  HENT:     Head: Normocephalic and atraumatic.  Eyes:     General:        Right eye: No discharge.        Left eye: No discharge.  Cardiovascular:     Rate and Rhythm: Normal rate and regular rhythm.  Pulmonary:     Effort: Pulmonary effort is normal. No respiratory distress.  Musculoskeletal:        General: No deformity.  Skin:    General: Skin is warm and dry.  Neurological:     Mental Status: She is alert and oriented to person, place, and time.  Psychiatric:  Mood and Affect: Mood normal.        Behavior: Behavior normal.     ED Results / Procedures / Treatments   Labs (all labs ordered are listed, but only abnormal results are displayed) Labs Reviewed  BASIC METABOLIC PANEL - Abnormal; Notable for the following components:      Result Value   Sodium 126 (*)    Chloride 88 (*)    All other components within normal limits  CBC - Abnormal; Notable for the following components:   WBC 3.9 (*)    HCT 34.8 (*)    All other components within normal limits  RESP PANEL BY RT-PCR (RSV, FLU A&B, COVID)  [id number]  ETHANOL  TROPONIN I (HIGH SENSITIVITY)    EKG EKG Interpretation Date/Time:  Wednesday October 05 2022 18:04:10 EDT Ventricular Rate:  87 PR Interval:  132 QRS Duration:  72 QT Interval:  364 QTC Calculation: 438 R  Axis:   79  Text Interpretation: Normal sinus rhythm Right atrial enlargement Minimal voltage criteria for LVH, may be normal variant ( Sokolow-Lyon ) Borderline ECG When compared with ECG of 14-Nov-2018 20:51, No significant change was found Confirmed by Ernie Avena ([id number]) on 10/05/2022 10:01:48 PM  Radiology DG Chest 2 View  Result Date: 10/05/2022 CLINICAL DATA:  Chest pain EXAM: CHEST - 2 VIEW COMPARISON:  11/14/2018 FINDINGS: Mild hyperinflation. Heart and mediastinal contours are within normal limits. No focal opacities or effusions. No acute bony abnormality. IMPRESSION: Hyperinflation.  No active cardiopulmonary disease. Electronically Signed   By: Charlett Nose M.D.   On: 10/05/2022 19:34    Procedures Procedures  {Document cardiac monitor, telemetry assessment procedure when appropriate:1}  Medications Ordered in ED Medications  ketorolac (TORADOL) 15 MG/ML injection 15 mg (has no administration in time range)  lidocaine (LIDODERM) 5 % 1 patch (has no administration in time range)  lidocaine (LIDODERM) 5 % 1 patch (has no administration in time range)  sodium chloride 0.9 % bolus 1,000 mL (1,000 mLs Intravenous New Bag/Given 10/05/22 2109)  morphine (PF) 4 MG/ML injection 4 mg (4 mg Intravenous Given 10/05/22 2111)  methocarbamol (ROBAXIN) tablet 500 mg (500 mg Oral Given 10/05/22 2106)    ED Course/ Medical Decision Making/ A&P    {   Click here for ABCD2, HEART and other calculatorsREFRESH Note before signing :1}                              Medical Decision Making Amount and/or Complexity of Data Reviewed Labs: ordered. Radiology: ordered.  Risk Prescription drug management.   This patient is a 62 y.o. female  who presents to the ED for concern of chest pain.   Differential diagnoses prior to evaluation: The emergent differential diagnosis includes, but is not limited to,  ACS, AAS, PE, Mallory-Weiss, Boerhaave's, Pneumonia, acute bronchitis, asthma or COPD  exacerbation, anxiety, MSK pain or traumatic injury to the chest, acid reflux versus other . This is not an exhaustive differential.   Past Medical History / Co-morbidities / Social History: Alcoholism, HTN, substance induced mood disorder  Additional history: Chart reviewed. Pertinent results include: Reviewed lab work, imaging from previous emergency department visits  Physical Exam: Physical exam performed. The pertinent findings include: Patient with some significant tenderness palpation along the chest wall, left shoulder  Lab Tests/Imaging studies: I personally interpreted labs/imaging and the pertinent results include: CBC overall unremarkable, troponin negative x 1 in context of chest pain ongoing  for greater than 6 hours, she seems likely musculoskeletal in nature, RVP negative for COVID, flu, RSV, ethanol level negative today.  Her BMP is notable for hyponatremia, worse compared to baseline, sodium 126.  Will administer fluid bolus, encouraged alcohol cessation.  I dependently interpreted plain film chest x-ray which shows no evidence of acute intrathoracic abnormality. I agree with the radiologist interpretation.  Cardiac monitoring: EKG obtained and interpreted by myself and attending physician which shows: Normal sinus rhythm   Medications: I ordered medication including Toradol, morphine, Robaxin, and Lidoderm patch.  I have reviewed the patients home medicines and have made adjustments as needed.   Disposition: After consideration of the diagnostic results and the patients response to treatment, I feel that *** .   ***emergency department workup does not suggest an emergent condition requiring admission or immediate intervention beyond what has been performed at this time. The plan is: ***. The patient is safe for discharge and has been instructed to return immediately for worsening symptoms, change in symptoms or any other concerns.  Final Clinical Impression(s) / ED  Diagnoses Final diagnoses:  Chest wall pain  Muscle strain of chest wall, initial encounter  Hyponatremia    Rx / DC Orders ED Discharge Orders          Ordered    methocarbamol (ROBAXIN) 500 MG tablet  2 times daily        10/05/22 2215

## 2022-10-05 NOTE — ED Notes (Signed)
EDP at bedside  

## 2022-10-05 NOTE — ED Provider Notes (Signed)
[place] [facility]ERGENCY DEPARTMENT AT [facility] Provider Note   CSN: [id number] Arrival date & time: 10/05/22  1753     History  Chief Complaint  Patient presents with   Chest Pain    Betty Avery is a 62 y.o. female With past medical history significant for substance-induced mood disorder, alcoholism who presents with concern for an severe left-sided chest pain, shoulder pain for the last several days, worsened today. Patient reports she was lifting a large pot from the stove, she additionally had a fall on the left shoulder and 1 to 2 weeks ago but reports that was not having significant pain at that time. Patient denies any previous history of ACS. She tried some Advil for pain at home without any relief.    Chest Pain      Home Medications Prior to Admission medications   Medication Sig Start Date End Date Taking? Authorizing Provider  methocarbamol (ROBAXIN) 500 MG tablet Take 1 tablet (500 mg total) by mouth 2 (two) times daily. 10/05/22  Yes Phila Shoaf H, PA-C  albuterol (VENTOLIN HFA) 108 (90 Base) MCG/ACT inhaler Inhale 2 puffs into the lungs every 6 (six) hours as needed for wheezing or shortness of breath. 11/01/21   Grayce Sessions, NP  alendronate (FOSAMAX) 70 MG tablet Take 1 tablet (70 mg total) by mouth every 7 (seven) days. Take with a full glass of water on an empty stomach. 11/10/21   Grayce Sessions, NP  atorvastatin (LIPITOR) 20 MG tablet Take 1 tablet (20 mg total) by mouth daily. 11/07/21   Grayce Sessions, NP  ibuprofen (ADVIL) 800 MG tablet Take 1 tablet (800 mg total) by mouth 3 (three) times daily. 07/29/22   Rising, Lurena Joiner, PA-C  amLODipine (NORVASC) 2.5 MG tablet Take 1 tablet (2.5 mg total) by mouth daily. 04/30/20 07/27/20  Grayce Sessions, NP  DULoxetine (CYMBALTA) 60 MG capsule Take 1 capsule (60 mg total) by mouth daily. 04/30/20 07/27/20  Grayce Sessions, NP  NITROSTAT 0.4 MG SL tablet Place 0.4 mg under the tongue  every 5 (five) minutes as needed for chest pain.  Patient not taking: Reported on 07/27/2020 02/13/14 07/27/20  [provider]      Allergies    Latex    Review of Systems   Review of Systems  Cardiovascular:  Positive for chest pain.  All other systems reviewed and are negative.   Physical Exam Updated Vital Signs BP (!) 158/76   Pulse 78   Temp 98.9 F (37.2 C)   Resp 16   LMP 02/22/2012   SpO2 100%  Physical Exam Vitals and nursing note reviewed.  Constitutional:      General: She is not in acute distress.    Appearance: Normal appearance.  HENT:     Head: Normocephalic and atraumatic.  Eyes:     General:        Right eye: No discharge.        Left eye: No discharge.  Cardiovascular:     Rate and Rhythm: Normal rate and regular rhythm.  Pulmonary:     Effort: Pulmonary effort is normal. No respiratory distress.  Musculoskeletal:        General: No deformity.  Skin:    General: Skin is warm and dry.  Neurological:     Mental Status: She is alert and oriented to person, place, and time.  Psychiatric:        Mood and Affect: Mood normal.  Behavior: Behavior normal.     ED Results / Procedures / Treatments   Labs (all labs ordered are listed, but only abnormal results are displayed) Labs Reviewed  BASIC METABOLIC PANEL - Abnormal; Notable for the following components:      Result Value   Sodium 126 (*)    Chloride 88 (*)    All other components within normal limits  CBC - Abnormal; Notable for the following components:   WBC 3.9 (*)    HCT 34.8 (*)    All other components within normal limits  RESP PANEL BY RT-PCR (RSV, FLU A&B, COVID)  RVPGX2  ETHANOL  TROPONIN I (HIGH SENSITIVITY)    EKG EKG Interpretation Date/Time:  Wednesday October 05 2022 18:04:10 EDT Ventricular Rate:  87 PR Interval:  132 QRS Duration:  72 QT Interval:  364 QTC Calculation: 438 R Axis:   79  Text Interpretation: Normal sinus rhythm Right atrial  enlargement Minimal voltage criteria for LVH, may be normal variant ( Sokolow-Lyon ) Borderline ECG When compared with ECG of 14-Nov-2018 20:51, No significant change was found Confirmed by Ernie Avena ([id number]) on 10/05/2022 10:01:48 PM  Radiology DG Chest 2 View  Result Date: 10/05/2022 CLINICAL DATA:  Chest pain EXAM: CHEST - 2 VIEW COMPARISON:  11/14/2018 FINDINGS: Mild hyperinflation. Heart and mediastinal contours are within normal limits. No focal opacities or effusions. No acute bony abnormality. IMPRESSION: Hyperinflation.  No active cardiopulmonary disease. Electronically Signed   By: Charlett Nose M.D.   On: 10/05/2022 19:34    Procedures Procedures    Medications Ordered in ED Medications  lidocaine (LIDODERM) 5 % 1 patch (1 patch Transdermal Patch Applied 10/05/22 2229)  lidocaine (LIDODERM) 5 % 1 patch (1 patch Transdermal Patch Applied 10/05/22 2229)  sodium chloride 0.9 % bolus 1,000 mL (0 mLs Intravenous Stopped 10/05/22 2224)  morphine (PF) 4 MG/ML injection 4 mg (4 mg Intravenous Given 10/05/22 2111)  methocarbamol (ROBAXIN) tablet 500 mg (500 mg Oral Given 10/05/22 2106)  ketorolac (TORADOL) 15 MG/ML injection 15 mg (15 mg Intravenous Given 10/05/22 2228)    ED Course/ Medical Decision Making/ A&P                                  Medical Decision Making Amount and/or Complexity of Data Reviewed Labs: ordered. Radiology: ordered.  Risk Prescription drug management.   This patient is a 62 y.o. female  who presents to the ED for concern of chest pain.   Differential diagnoses prior to evaluation: The emergent differential diagnosis includes, but is not limited to,  ACS, AAS, PE, Mallory-Weiss, Boerhaave's, Pneumonia, acute bronchitis, asthma or COPD exacerbation, anxiety, MSK pain or traumatic injury to the chest, acid reflux versus other . This is not an exhaustive differential.   Past Medical History / Co-morbidities / Social History: Alcoholism, HTN, substance  induced mood disorder  Additional history: Chart reviewed. Pertinent results include: Reviewed lab work, imaging from previous emergency department visits  Physical Exam: Physical exam performed. The pertinent findings include: Patient with some significant tenderness palpation along the chest wall, left shoulder  Lab Tests/Imaging studies: I personally interpreted labs/imaging and the pertinent results include: CBC overall unremarkable, troponin negative x 1 in context of chest pain ongoing for greater than 6 hours, she seems likely musculoskeletal in nature, RVP negative for COVID, flu, RSV, ethanol level negative today.  Her BMP is notable for hyponatremia, worse compared to baseline,  sodium 126.  Will administer fluid bolus, encouraged alcohol cessation.  I dependently interpreted plain film chest x-ray which shows no evidence of acute intrathoracic abnormality. I agree with the radiologist interpretation.  Cardiac monitoring: EKG obtained and interpreted by myself and attending physician which shows: Normal sinus rhythm   Medications: I ordered medication including Toradol, morphine, Robaxin, and Lidoderm patch.  I have reviewed the patients home medicines and have made adjustments as needed.   Disposition: After consideration of the diagnostic results and the patients response to treatment, I feel that patient is stable for discharge with suspicion for musculoskeletal chest pain, she reports a likely strain incident lifting a pot, encouraged Robaxin, lidocaine patches, ibuprofen, Tylenol, orthopedic follow-up as needed.  No evidence of acute ACS noted in the emergency department.Marland Kitchen   emergency department workup does not suggest an emergent condition requiring admission or immediate intervention beyond what has been performed at this time. The plan is: as above. The patient is safe for discharge and has been instructed to return immediately for worsening symptoms, change in symptoms or any  other concerns.  Final Clinical Impression(s) / ED Diagnoses Final diagnoses:  Chest wall pain  Muscle strain of chest wall, initial encounter  Hyponatremia    Rx / DC Orders ED Discharge Orders          Ordered    methocarbamol (ROBAXIN) 500 MG tablet  2 times daily        10/05/22 2215              [place] 10/06/22 0010    Ernie Avena, MD 10/06/22 [phone]

## 2022-11-18 ENCOUNTER — Ambulatory Visit (INDEPENDENT_AMBULATORY_CARE_PROVIDER_SITE_OTHER): Payer: Medicaid Other | Admitting: Primary Care

## 2022-12-01 ENCOUNTER — Ambulatory Visit (INDEPENDENT_AMBULATORY_CARE_PROVIDER_SITE_OTHER): Payer: Medicaid Other | Admitting: Primary Care

## 2022-12-01 ENCOUNTER — Encounter (INDEPENDENT_AMBULATORY_CARE_PROVIDER_SITE_OTHER): Payer: Self-pay | Admitting: Primary Care

## 2022-12-01 ENCOUNTER — Other Ambulatory Visit (INDEPENDENT_AMBULATORY_CARE_PROVIDER_SITE_OTHER): Payer: Self-pay | Admitting: Primary Care

## 2022-12-01 VITALS — BP 146/92 | HR 78 | Resp 16 | Ht 61.0 in | Wt 108.4 lb

## 2022-12-01 DIAGNOSIS — Z2821 Immunization not carried out because of patient refusal: Secondary | ICD-10-CM

## 2022-12-01 DIAGNOSIS — J9801 Acute bronchospasm: Secondary | ICD-10-CM

## 2022-12-01 DIAGNOSIS — M25512 Pain in left shoulder: Secondary | ICD-10-CM | POA: Diagnosis not present

## 2022-12-01 DIAGNOSIS — Z79899 Other long term (current) drug therapy: Secondary | ICD-10-CM

## 2022-12-01 DIAGNOSIS — I10 Essential (primary) hypertension: Secondary | ICD-10-CM

## 2022-12-01 MED ORDER — LIDOCAINE 5 % EX PTCH: 1.0000 | MEDICATED_PATCH | CUTANEOUS | 0 refills | Status: AC

## 2022-12-01 MED ORDER — ALBUTEROL SULFATE HFA 108 (90 BASE) MCG/ACT IN AERS
2.0000 | INHALATION_SPRAY | Freq: Four times a day (QID) | RESPIRATORY_TRACT | 2 refills | Status: AC | PRN
Start: 2022-12-01 — End: ?

## 2022-12-01 MED ORDER — ALENDRONATE SODIUM 70 MG PO TABS: 70.0000 mg | ORAL_TABLET | ORAL | 11 refills | Status: AC

## 2022-12-01 MED ORDER — AMLODIPINE BESYLATE 10 MG PO TABS
10.0000 mg | ORAL_TABLET | Freq: Every day | ORAL | 1 refills | Status: AC
Start: 2022-12-01 — End: ?

## 2022-12-01 NOTE — Telephone Encounter (Signed)
Requested medication (s) are due for refill today: Yes  Requested medication (s) are on the active medication list: No  Last refill:  05/16/20 Medication discontinued 07/27/20   Future visit scheduled: Yes  Notes to clinic:  Unable to refill per protocol, cannot delegate.      Requested Prescriptions  Pending Prescriptions Disp Refills   megestrol (MEGACE) 20 MG tablet [Pharmacy Med Name: MEGESTROL ACETATE 20MG  TABLETS] 30 tablet 5    Sig: TAKE 1 TABLET(20 MG) BY MOUTH DAILY     Not Delegated - OB/GYN:  Progestins - megestrol acetate Failed - 12/01/2022  2:24 PM      Failed - This refill cannot be delegated      Failed - Last BP in normal range    BP Readings from Last 1 Encounters:  12/01/22 (!) 146/92         Passed - Valid encounter within last 12 months    Recent Outpatient Visits           Today Influenza vaccination declined   [place] [facility] Grayce Sessions, NP   1 year ago Primary hypertension   [place] [facility] Grayce Sessions, NP   2 years ago Lipid screening   [place] [facility] Grayce Sessions, NP   3 years ago Status post fall   [place] [facility] Grayce Sessions, NP   3 years ago Pain in joint of left shoulder   [place] [facility] Grayce Sessions, NP       Future Appointments             In 3 weeks Randa Evens, Kinnie Scales, NP [place] [facility]

## 2022-12-01 NOTE — Progress Notes (Signed)
     [facility]           Subjective:    Betty Avery is a 62 y.o. female who presents with left shoulder pain. The symptoms began several months ago. Aggravating factors: lifting pots at home cooking . Pain is located the bone that connects the clavicle to the humerus. . Discomfort is described as aching, sharp/stabbing, and throbbing. Symptoms are exacerbated by overhead movements and lying on the shoulder. Evaluation to date: none. Therapy to date includes: rest, ice, avoidance of offending activity, and prescription NSAIDS which are somewhat effective.  The following portions of the patient's history were reviewed and updated as appropriate: allergies, current medications, past family history, past medical history, past social history, past surgical history, and problem list.  Review of Systems Pertinent items noted in HPI and remainder of comprehensive ROS otherwise negative.   Objective:    BP (!) 166/96 (BP Location: Left Arm, Patient Position: Sitting, Cuff Size: Small)   Pulse 78   Resp 16   Ht 5\' 1"  (1.549 m)   Wt 108 lb 6.4 oz (49.2 kg)   LMP 02/22/2012   SpO2 100%   BMI 20.48 kg/m   Assessment:  Betty Avery was seen today for shoulder pain.  Diagnoses and all orders for this visit:  Influenza vaccination declined  Herpes zoster vaccination declined  Acute pain of left shoulder -     Ambulatory referral to Orthopedic Surgery    Rest, ice, compression, and elevation (RICE) therapy. NSAIDs per medication orders. Patch lidocaine   Betty Avery was seen today for shoulder pain.  Diagnoses and all orders for this visit:  Influenza vaccination declined  Herpes zoster vaccination declined  Acute pain of left shoulder -     Ambulatory referral to Orthopedic Surgery  Bronchospasm -     albuterol (VENTOLIN HFA) 108 (90 Base) MCG/ACT inhaler; Inhale 2 puffs into the lungs every 6 (six) hours as needed for wheezing or shortness of  breath.  Medication management -     amLODipine (NORVASC) 10 MG tablet; Take 1 tablet (10 mg total) by mouth daily.  Essential hypertension BP goal - < 140/90 Explained that having normal blood pressure is the goal and medications are helping to get to goal and maintain normal blood pressure. DIET: Limit salt intake, read nutrition labels to check salt content, limit fried and high fatty foods  Avoid using multisymptom OTC cold preparations that generally contain sudafed which can rise BP. Consult with pharmacist on best cold relief products to use for persons with HTN EXERCISE Discussed incorporating exercise such as walking - 30 minutes most days of the week and can do in 10 minute intervals     Other orders -     alendronate (FOSAMAX) 70 MG tablet; Take 1 tablet (70 mg total) by mouth every 7 (seven) days. Take with a full glass of water on an empty stomach. -     lidocaine (LIDODERM) 5 %; Place 1 patch onto the skin daily. Remove & Discard patch within 12 hours or as directed by MD    This note has been created with Education officer, environmental. Any transcriptional errors are unintentional.   Grayce Sessions, NP 12/01/2022, 10:57 AM

## 2022-12-02 NOTE — Telephone Encounter (Signed)
Will forward to provider  

## 2022-12-12 ENCOUNTER — Telehealth (INDEPENDENT_AMBULATORY_CARE_PROVIDER_SITE_OTHER): Payer: Self-pay | Admitting: Primary Care

## 2022-12-12 NOTE — Telephone Encounter (Signed)
Pt came by the office stating when she seen Marcelino Duster she forgot to send in rx for appetite. Please follow up

## 2022-12-13 NOTE — Telephone Encounter (Signed)
Tried returning pt call was unable to reach pt due to call can't completed as this time

## 2022-12-22 ENCOUNTER — Ambulatory Visit (INDEPENDENT_AMBULATORY_CARE_PROVIDER_SITE_OTHER): Payer: Medicaid Other | Admitting: Primary Care

## 2023-01-26 ENCOUNTER — Other Ambulatory Visit: Payer: Self-pay | Admitting: Physician Assistant

## 2023-01-26 DIAGNOSIS — Z1231 Encounter for screening mammogram for malignant neoplasm of breast: Secondary | ICD-10-CM

## 2023-06-21 ENCOUNTER — Ambulatory Visit: Admitting: Physician Assistant

## 2023-08-29 ENCOUNTER — Ambulatory Visit

## 2023-09-04 ENCOUNTER — Encounter: Payer: Self-pay | Admitting: Gastroenterology

## 2023-10-26 ENCOUNTER — Encounter: Payer: Self-pay | Admitting: Gastroenterology

## 2023-10-26 ENCOUNTER — Ambulatory Visit (INDEPENDENT_AMBULATORY_CARE_PROVIDER_SITE_OTHER): Admitting: Gastroenterology

## 2023-10-26 ENCOUNTER — Other Ambulatory Visit (INDEPENDENT_AMBULATORY_CARE_PROVIDER_SITE_OTHER)

## 2023-10-26 VITALS — BP 114/70 | HR 74 | Ht 61.0 in | Wt 107.5 lb

## 2023-10-26 DIAGNOSIS — K760 Fatty (change of) liver, not elsewhere classified: Secondary | ICD-10-CM

## 2023-10-26 DIAGNOSIS — Z1211 Encounter for screening for malignant neoplasm of colon: Secondary | ICD-10-CM | POA: Diagnosis not present

## 2023-10-26 DIAGNOSIS — K700 Alcoholic fatty liver: Secondary | ICD-10-CM

## 2023-10-26 DIAGNOSIS — Z860100 Personal history of colon polyps, unspecified: Secondary | ICD-10-CM

## 2023-10-26 DIAGNOSIS — F1090 Alcohol use, unspecified, uncomplicated: Secondary | ICD-10-CM

## 2023-10-26 DIAGNOSIS — R1013 Epigastric pain: Secondary | ICD-10-CM

## 2023-10-26 LAB — PROTIME-INR
INR: 1 ratio (ref 0.8–1.0)
Prothrombin Time: 11.1 s (ref 9.6–13.1)

## 2023-10-26 LAB — CBC WITH DIFFERENTIAL/PLATELET
Basophils Absolute: 0 K/uL (ref 0.0–0.1)
Basophils Relative: 0.9 % (ref 0.0–3.0)
Eosinophils Absolute: 0 K/uL (ref 0.0–0.7)
Eosinophils Relative: 1.3 % (ref 0.0–5.0)
HCT: 35.5 % — ABNORMAL LOW (ref 36.0–46.0)
Hemoglobin: 11.8 g/dL — ABNORMAL LOW (ref 12.0–15.0)
Lymphocytes Relative: 33.6 % (ref 12.0–46.0)
Lymphs Abs: 1.3 K/uL (ref 0.7–4.0)
MCHC: 33.3 g/dL (ref 30.0–36.0)
MCV: 89.4 fl (ref 78.0–100.0)
Monocytes Absolute: 0.7 K/uL (ref 0.1–1.0)
Monocytes Relative: 18 % — ABNORMAL HIGH (ref 3.0–12.0)
Neutro Abs: 1.7 K/uL (ref 1.4–7.7)
Neutrophils Relative %: 46.2 % (ref 43.0–77.0)
Platelets: 231 K/uL (ref 150.0–400.0)
RBC: 3.96 Mil/uL (ref 3.87–5.11)
RDW: 14.2 % (ref 11.5–15.5)
WBC: 3.8 K/uL — ABNORMAL LOW (ref 4.0–10.5)

## 2023-10-26 LAB — IBC + FERRITIN
Ferritin: 57.4 ng/mL (ref 10.0–291.0)
Iron: 179 ug/dL — ABNORMAL HIGH (ref 42–145)
Saturation Ratios: 46.8 % (ref 20.0–50.0)
TIBC: 382.2 ug/dL (ref 250.0–450.0)
Transferrin: 273 mg/dL (ref 212.0–360.0)

## 2023-10-26 LAB — COMPREHENSIVE METABOLIC PANEL WITH GFR
ALT: 27 U/L (ref 0–35)
AST: 46 U/L — ABNORMAL HIGH (ref 0–37)
Albumin: 4.8 g/dL (ref 3.5–5.2)
Alkaline Phosphatase: 46 U/L (ref 39–117)
BUN: 13 mg/dL (ref 6–23)
CO2: 25 meq/L (ref 19–32)
Calcium: 10.3 mg/dL (ref 8.4–10.5)
Chloride: 99 meq/L (ref 96–112)
Creatinine, Ser: 0.56 mg/dL (ref 0.40–1.20)
GFR: 98.14 mL/min (ref >60.00)
Glucose, Bld: 93 mg/dL (ref 70–99)
Potassium: 4.3 meq/L (ref 3.5–5.1)
Sodium: 136 meq/L (ref 135–145)
Total Bilirubin: 0.7 mg/dL (ref 0.2–1.2)
Total Protein: 8.3 g/dL (ref 6.0–8.3)

## 2023-10-26 LAB — LIPASE: Lipase: 24 U/L (ref 11.0–59.0)

## 2023-10-26 MED ORDER — NALTREXONE HCL 50 MG PO TABS: 50.0000 mg | ORAL_TABLET | Freq: Every day | ORAL | 3 refills | Status: AC

## 2023-10-26 MED ORDER — NA SULFATE-K SULFATE-MG SULF 17.5-3.13-1.6 GM/177ML PO SOLN: 1.0000 | Freq: Once | ORAL | 0 refills | Status: AC

## 2023-10-26 NOTE — Progress Notes (Addendum)
 Discussed the use of AI scribe software for clinical note transcription with the patient, who gave verbal consent to proceed.  HPI : Betty Avery is a 63 year old female who presents for colon cancer screening and evaluation of abdominal pain.  She is here for colon cancer screening, having had a colonoscopy over ten years ago through [facility] gastroenterology during which she says several polyps were removed, but two or three were not.  We do not have these records for review today.  She does not recall any family history of colon cancer.  She has been experiencing abdominal pain for the past six to eight months. The pain is described as an intermittent ache located in the upper abdomen, occurring sometimes multiple times a day.  Not necessarily associated with eating. She experiences nausea at times but does not vomit. Her appetite varies but her weight has been stable over the past year at 108lbs.  She takes Pepto-Bismol, which sometimes alleviates the pain, and occasionally uses Tylenol  for relief. Her stools have turned black, which she attributes to Pepto-Bismol. No heartburn, difficulty swallowing, or red blood in her stools.  She has a history of asthma or COPD and uses an inhaler.  Her breathing is generally good.   She has a history of fatty liver noted on ultrasound in 2020 and CT in 2022 and reports regular alcohol consumption.  When asked average consumption on a average day, she replies too much.  She is interested in stopping drinking, and inquires about medications that can help with alcohol cessation.  The most recent labs I have for evaluation are from August of last year which showed an essentially normal CBC.  CMP notable for significant hyponatremia (sodium 126) and hypochloremia (88). Review of labs indicates she has had intermittently elevated liver enzymes, AST predominant over the past few years.  TSABRA burow has been normal largely except for slightly elevated in 2022. She  had a markedly elevated blood alcohol level noted in 2018 (330 mg/dL)  She lives alone but has a nurse who checks on her regularly.      CT Abd/pelvis Apr 2022 IMPRESSION: 1. Circumferential bladder wall thickening, correlate with urinalysis and for clinical features of cystitis. 2. No other acute abdominopelvic abnormality to provide cause for patient's abdominal pain. 3. Hepatic steatosis. Few enhancing foci in the liver have been present and grossly stable from comparison as remote as 2012, favoring benign lesion such as hemangiomata. 4. Few scattered sub 5 mm part solid nodules in the lung bases. No follow-up needed if patient is low-risk (and has no known or suspected primary neoplasm). Non-contrast chest CT can be considered in 12 months if patient is high-risk. This recommendation follows the consensus statement: Guidelines for Management of Incidental Pulmonary Nodules Detected on CT Images: From the Fleischner Society 2017; Radiology 2017; 284:228-243. 5. Small fat containing umbilical hernia. 6. Fibroid uterus 7. Aortic Atherosclerosis (ICD10-I70.0).   RUQUS Aug 2020  IMPRESSION: No evidence of cholelithiasis or biliary ductal dilatation.   Probable diffuse hepatocellular disease, with two small indeterminate hypoechoic liver lesions. Recommend abdomen MRI without and with contrast for further characterization.  Past Medical History:  Diagnosis Date   Asthma    Chronic constipation    COPD (chronic obstructive pulmonary disease) (HCC)    GERD (gastroesophageal reflux disease)    Hypercholesteremia    Hypertension    Pure hyperglyceridemia    S/P colonoscopic polypectomy 2013   Vitamin D deficiency    Wears glasses  Past Surgical History:  Procedure Laterality Date   COLONOSCOPY     x2   LEFT HEART CATHETERIZATION WITH CORONARY ANGIOGRAM N/A 02/18/2014   Procedure: LEFT HEART CATHETERIZATION WITH CORONARY ANGIOGRAM;  Surgeon: Rober LOISE Chroman, MD;   Location: [facility];  Service: Cardiovascular;  Laterality: N/A;   ORBITAL FRACTURE SURGERY  2001   rt eye-fx-plate    ORIF ANKLE FRACTURE Left 12/20/2016   Procedure: OPEN REDUCTION INTERNAL FIXATION (ORIF) LEFT  ANKLE FRACTURE;  Surgeon: Beverley Evalene BIRCH, MD;  Location: [facility];  Service: Orthopedics;  Laterality: Left;   TONSILLECTOMY     No family history on file. Social History   Tobacco Use   Smoking status: Never   Smokeless tobacco: Never  Vaping Use   Vaping status: Never Used  Substance Use Topics   Alcohol use: Yes    Alcohol/week: 14.0 standard drinks of alcohol    Types: 14 Cans of beer per week    Comment: beer- alcohol level 330 when pt fell   Drug use: No   Current Outpatient Medications  Medication Sig Dispense Refill   albuterol  (VENTOLIN  HFA) 108 (90 Base) MCG/ACT inhaler Inhale 2 puffs into the lungs every 6 (six) hours as needed for wheezing or shortness of breath. 8 g 2   alendronate  (FOSAMAX ) 70 MG tablet Take 1 tablet (70 mg total) by mouth every 7 (seven) days. Take with a full glass of water on an empty stomach. 4 tablet 11   amLODipine  (NORVASC ) 10 MG tablet Take 1 tablet (10 mg total) by mouth daily. 90 tablet 1   atorvastatin  (LIPITOR) 20 MG tablet Take 1 tablet (20 mg total) by mouth daily. 90 tablet 3   cholecalciferol (VITAMIN D3) 25 MCG (1000 UNIT) tablet Take 1,000 Units by mouth daily.     Diclofenac  Sodium 3 % GEL Apply 3 % topically in the morning and at bedtime.     fenofibrate (TRICOR) 48 MG tablet Take 48 mg by mouth daily.     gabapentin  (NEURONTIN ) 300 MG capsule Take 300 mg by mouth 3 (three) times daily.     gemfibrozil  (LOPID ) 600 MG tablet Take by mouth 2 (two) times daily before a meal.     ibuprofen  (ADVIL ) 800 MG tablet Take 1 tablet (800 mg total) by mouth 3 (three) times daily. 21 tablet 0   lidocaine  (LIDODERM ) 5 % Place 1 patch onto the skin daily. Remove & Discard patch within 12 hours or as directed by MD 30  patch 0   meloxicam (MOBIC) 15 MG tablet Take 15 mg by mouth as needed for pain.     methocarbamol  (ROBAXIN ) 500 MG tablet Take 1 tablet (500 mg total) by mouth 2 (two) times daily. 20 tablet 0   naltrexone  (DEPADE) 50 MG tablet Take 1 tablet (50 mg total) by mouth daily. 90 tablet 3   omeprazole (PRILOSEC) 20 MG capsule Take 20 mg by mouth daily.     SYMBICORT 80-4.5 MCG/ACT inhaler Inhale 2 puffs into the lungs daily.     No current facility-administered medications for this visit.   Allergies  Allergen Reactions   Latex Itching     Review of Systems: All systems reviewed and negative except where noted in HPI.    No results found.  Physical Exam: BP 114/70 (BP Location: Left Arm, Patient Position: Sitting, Cuff Size: Normal)   Pulse 74   Ht 5' 1 (1.549 m)   Wt 107 lb 8 oz (48.8 kg)  LMP 02/22/2012   BMI 20.31 kg/m  Constitutional: Pleasant,well-developed, African-American female in no acute distress. HEENT: Normocephalic and atraumatic. Conjunctivae are normal. No scleral icterus. Neck supple.  Cardiovascular: Normal rate, regular rhythm.  Pulmonary/chest: Effort normal and breath sounds normal. No wheezing, rales or rhonchi. Abdominal: Soft, nondistended, nontender. Bowel sounds active throughout. There are no masses palpable. No hepatomegaly. Extremities: no edema Lymphadenopathy: No cervical adenopathy noted. Neurological: Alert and oriented to person place and time.  No asterixis. Skin: Skin is warm and dry. No rashes noted.  No telangiectasias. Psychiatric: Normal mood and affect. Behavior is normal.  Is tearful at times during the encounter, particularly when discussing her alcohol use  CBC    Component Value Date/Time   WBC 3.9 (L) 10/05/2022 1803   RBC 4.18 10/05/2022 1803   HGB 12.4 10/05/2022 1803   HGB 12.5 11/01/2021 1033   HCT 34.8 (L) 10/05/2022 1803   HCT 38.5 11/01/2021 1033   PLT 207 10/05/2022 1803   PLT 339 11/01/2021 1033   MCV 83.3  10/05/2022 1803   MCV 90 11/01/2021 1033   MCH 29.7 10/05/2022 1803   MCHC 35.6 10/05/2022 1803   RDW 13.1 10/05/2022 1803   RDW 12.6 11/01/2021 1033   LYMPHSABS 1.5 07/27/2020 1016   LYMPHSABS 2.0 04/30/2020 1026   MONOABS 0.7 07/27/2020 1016   EOSABS 0.1 07/27/2020 1016   EOSABS 0.2 04/30/2020 1026   BASOSABS 0.0 07/27/2020 1016   BASOSABS 0.1 04/30/2020 1026    CMP     Component Value Date/Time   NA 126 (L) 10/05/2022 1803   NA 136 11/01/2021 1033   K 4.3 10/05/2022 1803   CL 88 (L) 10/05/2022 1803   CO2 25 10/05/2022 1803   GLUCOSE 93 10/05/2022 1803   BUN 12 10/05/2022 1803   BUN 13 11/01/2021 1033   CREATININE 0.75 10/05/2022 1803   CALCIUM  10.0 10/05/2022 1803   PROT 8.1 11/01/2021 1033   ALBUMIN 5.0 (H) 11/01/2021 1033   AST 21 11/01/2021 1033   ALT 10 11/01/2021 1033   ALKPHOS 59 11/01/2021 1033   BILITOT 0.5 11/01/2021 1033   GFRNONAA >60 10/05/2022 1803   GFRAA 116 05/29/2019 0948       Latest Ref Rng & Units 10/05/2022    6:03 PM 11/01/2021   10:33 AM 07/27/2020   10:16 AM  CBC EXTENDED  WBC 4.0 - 10.5 K/uL 3.9  5.1  5.1   RBC 3.87 - 5.11 MIL/uL 4.18  4.26  4.19   Hemoglobin 12.0 - 15.0 g/dL 87.5  87.4  87.3   HCT 36.0 - 46.0 % 34.8  38.5  36.5   Platelets 150 - 400 K/uL 207  339  331   NEUT# 1.7 - 7.7 K/uL   2.7   Lymph# 0.7 - 4.0 K/uL   1.5       ASSESSMENT AND PLAN:  64 year old female with reported history of colon polyps on colonoscopy 10 years ago to [place] (records not available), also with several months of recurrent epigastric pain without red flag symptoms, also with known fatty change to her liver on imaging and excessive alcohol use.  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  No evidence of cirrhosis based on imaging and labs from last year.  We discussed the risk of ongoing liver damage with ongoing use.  The patient indicates understanding of the risk, and does desire to stop drinking.  I recommended she start taking naltrexone   to help reduce cravings.  She has no  history of opioid use.   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 Discussed risk of cirrhosis progression and potential for liver repair with reduced alcohol consumption. - Prescribe naltrexone  to assist with alcohol use disorder. - Order right upper quadrant ultrasound with elastography to assess liver stiffness. - Order blood tests including CBC, CMP, INR, and iron panel to evaluate liver function.  Epigastric abdominal pain, evaluation for gastrointestinal etiology Intermittent epigastric abdominal pain for 6-8 months.  Differential includes peptic ulcer disease, gallstones, H. pylori infection, chronic pancreatitis, gastritis from alcohol. - Order stool test for H. pylori antigen. - Right upper quadrant ultrasound will exclude gallstones -Liver enzymes, lipase level - If H. pylori negative, and patient symptoms not improving, will consider upper endoscopy - Start empiric PPI after submitting H. pylori stool test  Colon cancer screening in patient with history of colonic polyps Colonoscopy indicated due to history of colonic polyps, last performed over ten years ago. Previous polyps reportedly not fully removed. - Schedule colonoscopy for colon cancer screening/questionable history of polyps. - Obtain previous colonoscopy records from [facility].  - Coordinate with nursing staff to provide date and time for colonoscopy.  Recording duration: 18 minutes     Courteney Alderete E. Stacia, MD [facility]  I spent a total of 40 minutes reviewing the patient's medical record, interviewing and examining the patient, discussing her diagnosis and management of her condition going forward, and documenting in the medical record    Celestia Rosaline SQUIBB, NP   ADDENDUM: Records received from [facility]: Colonoscopy Jan 2020 (Dr.  Dianna) Indication:  History of polyps (last colonoscopy Oct 2012) 5 mm cecal polyp 6 mm sigmoid polyp 20 mm carpet-like polyp in ascending colon removed with hot snare.  Pathology report: Cecum/sigmoid polyp:  tubular adenoma, sessile serrated adenoma Ascending colon polyp:  Tubular adenoma.  Recommended to repeat colonoscopy in 3 years.  Patient is overdue for polyp surveillance.  We will get her scheduled for a surveillance colonoscopy.  Zaidee Rion E. Stacia, MD [facility]

## 2023-10-26 NOTE — Patient Instructions (Signed)
 Your provider has requested that you go to the basement level for lab work before leaving today. Press B on the elevator. The lab is located at the first door on the left as you exit the elevator.  We have sent the following medications to your pharmacy for you to pick up at your convenience: naltrexone .  You have been scheduled for an abdominal ultrasound at [facility] (1st floor of hospital) on 10/1823 at 10:30am. Please arrive 30 minutes prior to your appointment for registration. Make certain not to have anything to eat or drink 6 hours prior to your appointment. Should you need to reschedule your appointment, please contact radiology at [phone]. This test typically takes about 30 minutes to perform.  You have been scheduled for a colonoscopy. Please follow written instructions given to you at your visit today.   If you use inhalers (even only as needed), please bring them with you on the day of your procedure.  DO NOT TAKE 7 DAYS PRIOR TO TEST- Trulicity (dulaglutide) Ozempic, Wegovy (semaglutide) Mounjaro (tirzepatide) Bydureon Bcise (exanatide extended release)  DO NOT TAKE 1 DAY PRIOR TO YOUR TEST Rybelsus (semaglutide) Adlyxin (lixisenatide) Victoza (liraglutide) Byetta (exanatide) ___________________________________________________________________________   _______________________________________________________  If your blood pressure at your visit was 140/90 or greater, please contact your primary care physician to follow up on this.  _______________________________________________________  If you are age 6 or older, your body mass index should be between 23-30. Your Body mass index is 20.31 kg/m. If this is out of the aforementioned range listed, please consider follow up with your Primary Care Provider.  If you are age 5 or younger, your body mass index should be between 19-25. Your Body mass index is 20.31 kg/m. If this is out of the aformentioned  range listed, please consider follow up with your Primary Care Provider.   ________________________________________________________  The [facility] providers would like to encourage you to use MYCHART to communicate with providers for non-urgent requests or questions.  Due to long hold times on the telephone, sending your provider a message by [facility] may be a faster and more efficient way to get a response.  Please allow 48 business hours for a response.  Please remember that this is for non-urgent requests.  _______________________________________________________  [facility] is using a team-based approach to care.  Your team is made up of your doctor and two to three APPS. Our APPS (Nurse Practitioners and Physician Assistants) work with your physician to ensure care continuity for you. They are fully qualified to address your health concerns and develop a treatment plan. They communicate directly with your gastroenterologist to care for you. Seeing the Advanced Practice Practitioners on your physician's team can help you by facilitating care more promptly, often allowing for earlier appointments, access to diagnostic testing, procedures, and other specialty referrals.

## 2023-10-28 ENCOUNTER — Ambulatory Visit: Payer: Self-pay | Admitting: Gastroenterology

## 2023-10-28 NOTE — Progress Notes (Signed)
 Betty Avery,  Please let Ms. Sweigert know that her labs overall looked good.  Her elevated AST to ALT ratio is indicative of chronic liver disease from alcohol, but her labs did not suggest any significant permanent liver damage (cirrhosis).  No evidence of pancreatic inflammation.  Will await elastography results.

## 2023-10-31 NOTE — Telephone Encounter (Signed)
 Patient returning call.

## 2023-11-02 ENCOUNTER — Ambulatory Visit (HOSPITAL_COMMUNITY)
Admission: RE | Admit: 2023-11-02 | Discharge: 2023-11-02 | Disposition: A | Discharge status: Home / Self Care | Source: Ambulatory Visit | Attending: Gastroenterology | Admitting: Gastroenterology

## 2023-11-02 DIAGNOSIS — K760 Fatty (change of) liver, not elsewhere classified: Secondary | ICD-10-CM | POA: Diagnosis present

## 2023-11-02 DIAGNOSIS — F1090 Alcohol use, unspecified, uncomplicated: Secondary | ICD-10-CM | POA: Diagnosis present

## 2023-11-02 DIAGNOSIS — R1013 Epigastric pain: Secondary | ICD-10-CM | POA: Insufficient documentation

## 2023-11-12 NOTE — Progress Notes (Signed)
 Betty Avery,  Your elastography did not show any evidence of cirrhosis. This is good news.  You do have evidence of chronic liver damage from alcohol, but it appears to be early, and your liver should be able to completely heal with reduction in your alcohol intake.  Alan,  Please relay to the above message to [name]

## 2023-11-15 ENCOUNTER — Ambulatory Visit: Admitting: Family

## 2023-11-20 ENCOUNTER — Telehealth: Payer: Self-pay | Admitting: *Deleted

## 2023-11-20 NOTE — Telephone Encounter (Signed)
-----   Message from Glendia FORBES Holt sent at 11/18/2023  8:19 AM EDT ----- Regarding: Schedule colonoscopy Team,  After receiving records from [facility], it is apparent she is over due for surveillance colonoscopy, as she had a large tubular adenoma removed in 2020.  Please contact the patient and assist in getting her scheduled for a colonoscopy in the [facility].  thanks

## 2023-11-20 NOTE — Telephone Encounter (Signed)
 Spoke to patient. She is already scheduled for colonoscopy on 12/07/23 at 8 am and has instructions. She will keep this appointment.

## 2023-12-04 ENCOUNTER — Encounter: Admitting: Gastroenterology

## 2023-12-07 ENCOUNTER — Telehealth: Payer: Self-pay | Admitting: Gastroenterology

## 2023-12-07 ENCOUNTER — Encounter: Admitting: Gastroenterology

## 2023-12-07 ENCOUNTER — Telehealth: Payer: Self-pay

## 2023-12-07 NOTE — Telephone Encounter (Signed)
 RN attempted to reach patient regarding her missed colonoscopy appointment for today at 0800. No answer and voicemail box was full, unable to leave a voicemail.

## 2023-12-07 NOTE — Telephone Encounter (Signed)
 Good morning Dr. Stacia,   I received a call from this patient stating that her father had passed away and she and her family have been in the hospital since last night. Patient was reschedule per her request for November the 4 th at 9:30 am. Please advise.   Thank you.

## 2023-12-16 ENCOUNTER — Other Ambulatory Visit (INDEPENDENT_AMBULATORY_CARE_PROVIDER_SITE_OTHER): Payer: Self-pay | Admitting: Primary Care

## 2023-12-18 ENCOUNTER — Telehealth: Payer: Self-pay | Admitting: Gastroenterology

## 2023-12-18 MED ORDER — NA SULFATE-K SULFATE-MG SULF 17.5-3.13-1.6 GM/177ML PO SOLN
1.0000 | Freq: Once | ORAL | 0 refills | Status: AC
Start: 2023-12-18 — End: 2023-12-18

## 2023-12-18 NOTE — Telephone Encounter (Signed)
 Called pt and verified pharmacy. Resent prescription for Suprep to her pharmacy. Informed pt that the prescription had been sent after her office visit in September, but that the pharmacy may have restocked the medication since she had not picked it up yet. Pt asked if instructions could be sent to her. Informed pt that there was not way to get the instructions to her unless she comes in to pick them up as she does not have MyChart. Pt states she will come today to get the instructions. Will leave at 4th floor receptionist desk.    Addendum: called pt back to be sure she knew to stay on clear liquids today since she did not have her instructions. She reports that she found her instructions at home after we had ended the call.

## 2023-12-18 NOTE — Telephone Encounter (Signed)
 Patient states she s needed prep medication sent to pharmacy for tomorrow's colonoscopy. Requesting a call after it has been called in. Please advise, thank you

## 2023-12-19 ENCOUNTER — Ambulatory Visit: Admitting: Gastroenterology

## 2023-12-19 ENCOUNTER — Encounter: Payer: Self-pay | Admitting: Gastroenterology

## 2023-12-19 VITALS — BP 126/81 | HR 72 | Temp 97.3°F | Resp 13 | Ht 61.0 in | Wt 107.0 lb

## 2023-12-19 DIAGNOSIS — R1013 Epigastric pain: Secondary | ICD-10-CM

## 2023-12-19 DIAGNOSIS — Z860101 Personal history of adenomatous and serrated colon polyps: Secondary | ICD-10-CM

## 2023-12-19 DIAGNOSIS — Z860100 Personal history of colon polyps, unspecified: Secondary | ICD-10-CM

## 2023-12-19 DIAGNOSIS — K6289 Other specified diseases of anus and rectum: Secondary | ICD-10-CM | POA: Diagnosis not present

## 2023-12-19 DIAGNOSIS — Z1211 Encounter for screening for malignant neoplasm of colon: Secondary | ICD-10-CM

## 2023-12-19 MED ORDER — SODIUM CHLORIDE 0.9 % IV SOLN: 500.0000 mL | Freq: Once | INTRAVENOUS | Status: DC

## 2023-12-19 NOTE — Progress Notes (Signed)
 [facility] History and Physical   Primary Care Physician:  Rosalea Rosina SAILOR, PA   Reason for Procedure:   Colon cancer screening/history of polyps  Plan:    Colonoscopy     HPI: Betty Avery is a 63 y.o. female undergoing surveillance colonoscopy.  She had a 20 mm carpet like polyp removed from the ascending colon in 2020 (tubular adenoma).  She has no family history of colon cancer and no chronic lower GI symptoms.    Past Medical History:  Diagnosis Date   Asthma    Chronic constipation    COPD (chronic obstructive pulmonary disease) (HCC)    GERD (gastroesophageal reflux disease)    Hypercholesteremia    Hypertension    Pure hyperglyceridemia    S/P colonoscopic polypectomy 2013   Vitamin D deficiency    Wears glasses     Past Surgical History:  Procedure Laterality Date   COLONOSCOPY     x2   LEFT HEART CATHETERIZATION WITH CORONARY ANGIOGRAM N/A 02/18/2014   Procedure: LEFT HEART CATHETERIZATION WITH CORONARY ANGIOGRAM;  Surgeon: Rober SAILOR Chroman, MD;  Location: [facility]ATH [facility];  Service: Cardiovascular;  Laterality: N/A;   ORBITAL FRACTURE SURGERY  2001   rt eye-fx-plate    ORIF ANKLE FRACTURE Left 12/20/2016   Procedure: OPEN REDUCTION INTERNAL FIXATION (ORIF) LEFT  ANKLE FRACTURE;  Surgeon: Beverley Evalene BIRCH, MD;  Location: [facility];  Service: Orthopedics;  Laterality: Left;   TONSILLECTOMY      Prior to Admission medications   Medication Sig Start Date End Date Taking? Authorizing Provider  albuterol  (VENTOLIN  HFA) 108 (90 Base) MCG/ACT inhaler Inhale 2 puffs into the lungs every 6 (six) hours as needed for wheezing or shortness of breath. 12/01/22  Yes Celestia Rosaline SQUIBB, NP  alendronate  (FOSAMAX ) 70 MG tablet Take 1 tablet (70 mg total) by mouth every 7 (seven) days. Take with a full glass of water on an empty stomach. 12/01/22  Yes Celestia Rosaline SQUIBB, NP  atorvastatin  (LIPITOR) 20 MG tablet Take 1 tablet (20 mg total) by mouth  daily. 11/07/21  Yes Celestia Rosaline SQUIBB, NP  gabapentin  (NEURONTIN ) 300 MG capsule Take 300 mg by mouth 3 (three) times daily.   Yes [provider]  gemfibrozil  (LOPID ) 600 MG tablet Take by mouth 2 (two) times daily before a meal. 02/23/16  Yes [provider]  ibuprofen  (ADVIL ) 800 MG tablet Take 1 tablet (800 mg total) by mouth 3 (three) times daily. 07/29/22  Yes Rising, Asberry, PA-C  naltrexone  (DEPADE) 50 MG tablet Take 1 tablet (50 mg total) by mouth daily. 10/26/23  Yes Stacia Glendia BRAVO, MD  amLODipine  (NORVASC ) 10 MG tablet Take 1 tablet (10 mg total) by mouth daily. 12/01/22   Celestia Rosaline SQUIBB, NP  cholecalciferol (VITAMIN D3) 25 MCG (1000 UNIT) tablet Take 1,000 Units by mouth daily. 02/23/16   [provider]  Diclofenac  Sodium 3 % GEL Apply 3 % topically in the morning and at bedtime. 01/23/23   [provider]  fenofibrate (TRICOR) 48 MG tablet Take 48 mg by mouth daily. 02/23/16   [provider]  lidocaine  (LIDODERM ) 5 % Place 1 patch onto the skin daily. Remove & Discard patch within 12 hours or as directed by MD 12/01/22   Celestia Rosaline SQUIBB, NP  meloxicam (MOBIC) 15 MG tablet Take 15 mg by mouth as needed for pain. 03/05/21   [provider]  methocarbamol  (ROBAXIN ) 500 MG tablet Take 1 tablet (500 mg  total) by mouth 2 (two) times daily. 10/05/22   Prosperi, Christian H, PA-C  omeprazole (PRILOSEC) 20 MG capsule Take 20 mg by mouth daily.    [provider]  SYMBICORT 80-4.5 MCG/ACT inhaler Inhale 2 puffs into the lungs daily.    [provider]  DULoxetine  (CYMBALTA ) 60 MG capsule Take 1 capsule (60 mg total) by mouth daily. 04/30/20 07/27/20  Celestia Rosaline SQUIBB, NP  NITROSTAT  0.4 MG SL tablet Place 0.4 mg under the tongue every 5 (five) minutes as needed for chest pain.  Patient not taking: Reported on 07/27/2020 02/13/14 07/27/20  [provider]    Current Outpatient Medications  Medication Sig  Dispense Refill   albuterol  (VENTOLIN  HFA) 108 (90 Base) MCG/ACT inhaler Inhale 2 puffs into the lungs every 6 (six) hours as needed for wheezing or shortness of breath. 8 g 2   alendronate  (FOSAMAX ) 70 MG tablet Take 1 tablet (70 mg total) by mouth every 7 (seven) days. Take with a full glass of water on an empty stomach. 4 tablet 11   atorvastatin  (LIPITOR) 20 MG tablet Take 1 tablet (20 mg total) by mouth daily. 90 tablet 3   gabapentin  (NEURONTIN ) 300 MG capsule Take 300 mg by mouth 3 (three) times daily.     gemfibrozil  (LOPID ) 600 MG tablet Take by mouth 2 (two) times daily before a meal.     ibuprofen  (ADVIL ) 800 MG tablet Take 1 tablet (800 mg total) by mouth 3 (three) times daily. 21 tablet 0   naltrexone  (DEPADE) 50 MG tablet Take 1 tablet (50 mg total) by mouth daily. 90 tablet 3   amLODipine  (NORVASC ) 10 MG tablet Take 1 tablet (10 mg total) by mouth daily. 90 tablet 1   cholecalciferol (VITAMIN D3) 25 MCG (1000 UNIT) tablet Take 1,000 Units by mouth daily.     Diclofenac  Sodium 3 % GEL Apply 3 % topically in the morning and at bedtime.     fenofibrate (TRICOR) 48 MG tablet Take 48 mg by mouth daily.     lidocaine  (LIDODERM ) 5 % Place 1 patch onto the skin daily. Remove & Discard patch within 12 hours or as directed by MD 30 patch 0   meloxicam (MOBIC) 15 MG tablet Take 15 mg by mouth as needed for pain.     methocarbamol  (ROBAXIN ) 500 MG tablet Take 1 tablet (500 mg total) by mouth 2 (two) times daily. 20 tablet 0   omeprazole (PRILOSEC) 20 MG capsule Take 20 mg by mouth daily.     SYMBICORT 80-4.5 MCG/ACT inhaler Inhale 2 puffs into the lungs daily.     Current Facility-Administered Medications  Medication Dose Route Frequency Provider Last Rate Last Admin   0.9 %  sodium chloride  infusion  500 mL Intravenous Once Stacia Glendia BRAVO, MD        Allergies as of 12/19/2023 - Review Complete 12/19/2023  Allergen Reaction Noted   Latex Itching 02/12/2013    History reviewed. No  pertinent family history.  Social History   Socioeconomic History   Marital status: Single    Spouse name: Not on file   Number of children: Not on file   Years of education: Not on file   Highest education level: Not on file  Occupational History   Not on file  Tobacco Use   Smoking status: Some Days    Current packs/day: 0.25    Types: Cigarettes   Smokeless tobacco: Never  Vaping Use   Vaping status: Never Used  Substance and Sexual Activity   Alcohol use: Yes    Alcohol/week: 14.0 standard drinks of alcohol    Types: 14 Cans of beer per week    Comment: beer- alcohol level 330 when pt fell   Drug use: No   Sexual activity: Not on file    Comment: a few a day  Other Topics Concern   Not on file  Social History Narrative   Not on file   Social Drivers of Health   Financial Resource Strain: Not on file  Food Insecurity: Not on file  Transportation Needs: Not on file  Physical Activity: Not on file  Stress: Not on file  Social Connections: Not on file  Intimate Partner Violence: Not on file    Review of Systems:  All other review of systems negative except as mentioned in the HPI.  Physical Exam: Vital signs BP 112/74   Pulse 79   Temp (!) 97.3 F (36.3 C) (Temporal)   Ht 5' 1 (1.549 m)   Wt 107 lb (48.5 kg)   LMP 02/22/2012   SpO2 100%   BMI 20.22 kg/m   General:   Alert,  Well-developed, well-nourished, pleasant and cooperative in NAD Airway:  Mallampati 2 Lungs:  Clear throughout to auscultation.   Heart:  Regular rate and rhythm; no murmurs, clicks, rubs,  or gallops. Abdomen:  Soft, nontender and nondistended. Normal bowel sounds.   Neuro/Psych:  Normal mood and affect. A and O x 3   Maurene Hollin E. Stacia, MD [facility]

## 2023-12-19 NOTE — Patient Instructions (Signed)
  Resume previous diet. Continue present medications. Repeat colonoscopy in 5 years for surveillance   YOU HAD AN ENDOSCOPIC PROCEDURE TODAY AT [facility]:   Refer to the procedure report that was given to you for any specific questions about what was found during the examination.  If the procedure report does not answer your questions, please call your gastroenterologist to clarify.  If you requested that your care partner not be given the details of your procedure findings, then the procedure report has been included in a sealed envelope for you to review at your convenience later.  YOU SHOULD EXPECT: Some feelings of bloating in the abdomen. Passage of more gas than usual.  Walking can help get rid of the air that was put into your GI tract during the procedure and reduce the bloating. If you had a lower endoscopy (such as a colonoscopy or flexible sigmoidoscopy) you may notice spotting of blood in your stool or on the toilet paper. If you underwent a bowel prep for your procedure, you may not have a normal bowel movement for a few days.  Please Note:  You might notice some irritation and congestion in your nose or some drainage.  This is from the oxygen used during your procedure.  There is no need for concern and it should clear up in a day or so.  SYMPTOMS TO REPORT IMMEDIATELY:  Following lower endoscopy (colonoscopy or flexible sigmoidoscopy):  Excessive amounts of blood in the stool  Significant tenderness or worsening of abdominal pains  Swelling of the abdomen that is new, acute  Fever of 100F or higher  For urgent or emergent issues, a gastroenterologist can be reached at any hour by calling [phone]. Do not use MyChart messaging for urgent concerns.    DIET:  We do recommend a small meal at first, but then you may proceed to your regular diet.  Drink plenty of fluids but you should avoid alcoholic beverages for 24 hours.  ACTIVITY:  You should plan to  take it easy for the rest of today and you should NOT DRIVE or use heavy machinery until tomorrow (because of the sedation medicines used during the test).    FOLLOW UP: Our staff will call the number listed on your records the next business day following your procedure.  We will call around 7:15- 8:00 am to check on you and address any questions or concerns that you may have regarding the information given to you following your procedure. If we do not reach you, we will leave a message.     If any biopsies were taken you will be contacted by phone or by letter within the next 1-3 weeks.  Please call us at [phone] if you have not heard about the biopsies in 3 weeks.    SIGNATURES/CONFIDENTIALITY: You and/or your care partner have signed paperwork which will be entered into your electronic medical record.  These signatures attest to the fact that that the information above on your After Visit Summary has been reviewed and is understood.  Full responsibility of the confidentiality of this discharge information lies with you and/or your care-partner.

## 2023-12-19 NOTE — Progress Notes (Signed)
 Sedate, gd SR, tolerated procedure well, VSS, report to RN

## 2023-12-19 NOTE — Op Note (Signed)
 [facility] Patient Name: Betty Avery Procedure Date: 12/19/2023 10:42 AM MRN: [id number] Endoscopist: Glendia E. Stacia , MD, [id number] Age: 63 Referring MD:  Date of Birth: 1960-07-15 Gender: Female Account #: [id number] Procedure:                Colonoscopy Indications:              High risk colon cancer surveillance: Personal                            history of adenoma (10 mm or greater in size); 20                            mm TA removed from ascending colon in 2020. Medicines:                Monitored Anesthesia Care Procedure:                Pre-Anesthesia Assessment:                           - Prior to the procedure, a History and Physical                            was performed, and patient medications and                            allergies were reviewed. The patient's tolerance of                            previous anesthesia was also reviewed. The risks                            and benefits of the procedure and the sedation                            options and risks were discussed with the patient.                            All questions were answered, and informed consent                            was obtained. Prior Anticoagulants: The patient has                            taken no anticoagulant or antiplatelet agents. ASA                            Grade Assessment: II - A patient with mild systemic                            disease. After reviewing the risks and benefits,                            the patient was deemed in satisfactory condition to  undergo the procedure.                           After obtaining informed consent, the colonoscope                            was passed under direct vision. Throughout the                            procedure, the patient's blood pressure, pulse, and                            oxygen saturations were monitored continuously. The                            Olympus  Scope SN: [id number] was introduced through                            the anus and advanced to the the terminal ileum,                            with identification of the appendiceal orifice and                            IC valve. The colonoscopy was performed without                            difficulty. The patient tolerated the procedure                            well. The quality of the bowel preparation was                            good. The terminal ileum, ileocecal valve,                            appendiceal orifice, and rectum were photographed.                            The bowel preparation used was SUPREP via extended                            prep with split dose instruction. Scope In: 11:02:49 AM Scope Out: 11:17:27 AM Scope Withdrawal Time: 0 hours 10 minutes 26 seconds  Total Procedure Duration: 0 hours 14 minutes 38 seconds  Findings:                 The perianal and digital rectal examinations were                            normal. Pertinent negatives include normal                            sphincter tone and no palpable rectal lesions.  The colon (entire examined portion) appeared                            normal. Careful examination of the ascending colon                            in the anterograde and retrograde views did not                            show any residual polyp or obvious polypectomy scar.                           The terminal ileum appeared normal.                           Anal papilla(e) were hypertrophied.                           No additional abnormalities were found on                            retroflexion. Complications:            No immediate complications. Estimated Blood Loss:     Estimated blood loss: none. Impression:               - The entire examined colon is normal.                           - The examined portion of the ileum was normal.                           - Anal papilla(e) were  hypertrophied.                           - No specimens collected. Recommendation:           - Patient has a contact number available for                            emergencies. The signs and symptoms of potential                            delayed complications were discussed with the                            patient. Return to normal activities tomorrow.                            Written discharge instructions were provided to the                            patient.                           - Resume previous diet.                           -  Continue present medications.                           - Repeat colonoscopy in 5 years for surveillance. [name] E. Stacia, MD 12/19/2023 11:23:59 AM This report has been signed electronically.

## 2023-12-20 ENCOUNTER — Telehealth: Payer: Self-pay

## 2023-12-20 NOTE — Telephone Encounter (Signed)
  Follow up Call-     12/19/2023   10:08 AM  Call back number  Post procedure Call Back phone  # [phone] call spouse per patient  Permission to leave phone message No     Patient questions:  Do you have a fever, pain , or abdominal swelling? No. Pain Score  0 *  Have you tolerated food without any problems? Yes.    Have you been able to return to your normal activities? Yes.    Do you have any questions about your discharge instructions: Diet   No. Medications  No. Follow up visit  No.  Do you have questions or concerns about your Care? No.  Actions: * If pain score is 4 or above: No action needed, pain <4.

## 2024-01-06 ENCOUNTER — Emergency Department (HOSPITAL_COMMUNITY)

## 2024-01-06 ENCOUNTER — Other Ambulatory Visit: Payer: Self-pay

## 2024-01-06 ENCOUNTER — Emergency Department (HOSPITAL_COMMUNITY)
Admission: EM | Admit: 2024-01-06 | Discharge: 2024-01-07 | Disposition: A | Discharge status: Home / Self Care | Attending: Emergency Medicine | Admitting: Emergency Medicine

## 2024-01-06 DIAGNOSIS — R0789 Other chest pain: Secondary | ICD-10-CM | POA: Diagnosis not present

## 2024-01-06 DIAGNOSIS — E871 Hypo-osmolality and hyponatremia: Secondary | ICD-10-CM | POA: Diagnosis not present

## 2024-01-06 DIAGNOSIS — R079 Chest pain, unspecified: Secondary | ICD-10-CM

## 2024-01-06 DIAGNOSIS — R059 Cough, unspecified: Secondary | ICD-10-CM | POA: Diagnosis not present

## 2024-01-06 DIAGNOSIS — Z91040 Latex allergy status: Secondary | ICD-10-CM | POA: Diagnosis not present

## 2024-01-06 LAB — BASIC METABOLIC PANEL WITH GFR
Anion gap: 17 — ABNORMAL HIGH (ref 5–15)
BUN: 11 mg/dL (ref 8–23)
CO2: 19 mmol/L — ABNORMAL LOW (ref 22–32)
Calcium: 9.2 mg/dL (ref 8.9–10.3)
Chloride: 90 mmol/L — ABNORMAL LOW (ref 98–111)
Creatinine, Ser: 0.8 mg/dL (ref 0.44–1.00)
GFR, Estimated: >60 mL/min (ref >60)
Glucose, Bld: 87 mg/dL (ref 70–99)
Potassium: 4.1 mmol/L (ref 3.5–5.1)
Sodium: 126 mmol/L — ABNORMAL LOW (ref 135–145)

## 2024-01-06 LAB — CBC
HCT: 33.8 % — ABNORMAL LOW (ref 36.0–46.0)
Hemoglobin: 11.7 g/dL — ABNORMAL LOW (ref 12.0–15.0)
MCH: 29.7 pg (ref 26.0–34.0)
MCHC: 34.6 g/dL (ref 30.0–36.0)
MCV: 85.8 fL (ref 80.0–100.0)
Platelets: 272 K/uL (ref 150–400)
RBC: 3.94 MIL/uL (ref 3.87–5.11)
RDW: 12.6 % (ref 11.5–15.5)
WBC: 7.1 K/uL (ref 4.0–10.5)
nRBC: 0 % (ref 0.0–0.2)

## 2024-01-06 LAB — TROPONIN I (HIGH SENSITIVITY): Troponin I (High Sensitivity): 5 ng/L (ref <18)

## 2024-01-06 MED ORDER — OXYCODONE-ACETAMINOPHEN 5-325 MG PO TABS
1.0000 | ORAL_TABLET | Freq: Once | ORAL | Status: AC
  Administered 2024-01-06: 1 via ORAL
  Filled 2024-01-06: qty 1

## 2024-01-06 NOTE — ED Triage Notes (Signed)
 Patient and family coming from bus stop for eval of three days of intermittent central chest pain. Productive cough x 1 week.

## 2024-01-06 NOTE — ED Provider Triage Note (Signed)
 Emergency Medicine Provider Triage Evaluation Note  Betty Avery , a 63 y.o. female  was evaluated in triage.  Pt complains of left-sided chest pain.  Review of Systems  Positive: Chest pain, cough, headache Negative: Abdominal pain, nausea, vomiting  Physical Exam  BP 126/82 (BP Location: Left Arm)   Pulse 82   Temp 97.8 F (36.6 C) (Oral)   Resp 15   Ht 5' (1.524 m)   Wt 49.9 kg   LMP 02/22/2012   SpO2 98%   BMI 21.48 kg/m  Gen:   Awake, no distress  Resp:  Normal effort  MSK:   Moves extremities without difficulty, reproducible left-sided chest pain  Medical Decision Making  Medically screening exam initiated at 5:31 PM.  Appropriate orders placed.  Betty Avery was informed that the remainder of the evaluation will be completed by another provider, this initial triage assessment does not replace that evaluation, and the importance of remaining in the ED until their evaluation is complete.  Patient presenting for worsening left-sided chest pain over the past week.  This is in the setting of a worsened cough.  Patient describes left-sided chest pain that radiates into her neck and left side of head.  EMS was called from a bus stop due to the worsening of pain.  Chest pain is reproducible on exam.   [name], MD 01/06/24 1733

## 2024-01-07 LAB — TROPONIN I (HIGH SENSITIVITY): Troponin I (High Sensitivity): 6 ng/L (ref <18)

## 2024-01-07 LAB — RESP PANEL BY RT-PCR (RSV, FLU A&B, COVID)  RVPGX2
Influenza A by PCR: NEGATIVE
Influenza B by PCR: NEGATIVE
Resp Syncytial Virus by PCR: NEGATIVE
SARS Coronavirus 2 by RT PCR: NEGATIVE

## 2024-01-07 MED ORDER — KETOROLAC TROMETHAMINE 15 MG/ML IJ SOLN
15.0000 mg | Freq: Once | INTRAMUSCULAR | Status: AC
  Administered 2024-01-07: 15 mg via INTRAVENOUS
  Filled 2024-01-07: qty 1

## 2024-01-07 MED ORDER — LIDOCAINE 5 % EX PTCH
1.0000 | MEDICATED_PATCH | CUTANEOUS | Status: DC
  Administered 2024-01-07: 1 via TRANSDERMAL
  Filled 2024-01-07: qty 1

## 2024-01-07 MED ORDER — BENZONATATE 100 MG PO CAPS: 100.0000 mg | ORAL_CAPSULE | Freq: Three times a day (TID) | ORAL | 0 refills | Status: AC | PRN

## 2024-01-07 MED ORDER — SODIUM CHLORIDE 0.9 % IV BOLUS
1000.0000 mL | Freq: Once | INTRAVENOUS | Status: AC
  Administered 2024-01-07: 1000 mL via INTRAVENOUS

## 2024-01-07 NOTE — Discharge Instructions (Signed)
 Your workup today was reassuring.  You likely have soreness in her chest wall from your chronic cough.  You may use prescribed cough medication to help with this, you may also use over-the-counter medication.   The muscles in your chest wall are likely strained from coughing.  This can be quite painful.  To help with your pain you may take Tylenol  and / or NSAID medication (such as ibuprofen  or naproxen ) to help with your pain.   You may also utilize topical pain relief such as Biofreeze, IcyHot, or topical lidocaine  patches.  I also recommend that you apply heat to the area, such as a hot shower or heating pad, and follow heat application with massage of the muscles that are most tight.  Please return to the emergency department if you develop any numbness/tingling/weakness in your arms or legs, any difficulty urinating, or urinary incontinence chest pain, shortness of breath, abdominal pain, nausea or vomiting that does not stop, or any other new severe symptoms.

## 2024-01-07 NOTE — ED Provider Notes (Addendum)
 [facility] Provider Note   CSN: [id number] Arrival date & time: 01/06/24  1719     Patient presents with: Chest Pain   Betty Avery is a 63 y.o. female who presents to the ER with concern for left sided chest wall pain x 1 week in context of persistent postviral cough after recent illness.  She denies any palpitations, shortness of breath, or radiation of her pain.  Pain is worse with movement, with palpation of the left chest wall, and with deep inspiration.  No alleviating factors identified.   HPI     Prior to Admission medications   Medication Sig Start Date End Date Taking? Authorizing Provider  albuterol  (VENTOLIN  HFA) 108 (90 Base) MCG/ACT inhaler Inhale 2 puffs into the lungs every 6 (six) hours as needed for wheezing or shortness of breath. 12/01/22   Celestia Rosaline SQUIBB, NP  alendronate  (FOSAMAX ) 70 MG tablet Take 1 tablet (70 mg total) by mouth every 7 (seven) days. Take with a full glass of water on an empty stomach. 12/01/22   Celestia Rosaline SQUIBB, NP  amLODipine  (NORVASC ) 10 MG tablet Take 1 tablet (10 mg total) by mouth daily. 12/01/22   Celestia Rosaline SQUIBB, NP  atorvastatin  (LIPITOR) 20 MG tablet Take 1 tablet (20 mg total) by mouth daily. 11/07/21   Celestia Rosaline SQUIBB, NP  cholecalciferol (VITAMIN D3) 25 MCG (1000 UNIT) tablet Take 1,000 Units by mouth daily. 02/23/16   [provider]  Diclofenac  Sodium 3 % GEL Apply 3 % topically in the morning and at bedtime. 01/23/23   [provider]  fenofibrate (TRICOR) 48 MG tablet Take 48 mg by mouth daily. 02/23/16   [provider]  gabapentin  (NEURONTIN ) 300 MG capsule Take 300 mg by mouth 3 (three) times daily.    [provider]  gemfibrozil  (LOPID ) 600 MG tablet Take by mouth 2 (two) times daily before a meal. 02/23/16   [provider]  ibuprofen  (ADVIL ) 800 MG tablet Take 1 tablet (800 mg total) by mouth 3 (three) times daily. 07/29/22    Rising, Asberry, PA-C  lidocaine  (LIDODERM ) 5 % Place 1 patch onto the skin daily. Remove & Discard patch within 12 hours or as directed by MD 12/01/22   Celestia Rosaline SQUIBB, NP  meloxicam (MOBIC) 15 MG tablet Take 15 mg by mouth as needed for pain. 03/05/21   [provider]  methocarbamol  (ROBAXIN ) 500 MG tablet Take 1 tablet (500 mg total) by mouth 2 (two) times daily. 10/05/22   Prosperi, Christian H, PA-C  naltrexone  (DEPADE) 50 MG tablet Take 1 tablet (50 mg total) by mouth daily. 10/26/23   Stacia Glendia BRAVO, MD  omeprazole (PRILOSEC) 20 MG capsule Take 20 mg by mouth daily.    [provider]  SYMBICORT 80-4.5 MCG/ACT inhaler Inhale 2 puffs into the lungs daily.    [provider]  DULoxetine  (CYMBALTA ) 60 MG capsule Take 1 capsule (60 mg total) by mouth daily. 04/30/20 07/27/20  Celestia Rosaline SQUIBB, NP  NITROSTAT  0.4 MG SL tablet Place 0.4 mg under the tongue every 5 (five) minutes as needed for chest pain.  Patient not taking: Reported on 07/27/2020 02/13/14 07/27/20  [provider]    Allergies: Latex    Review of Systems  Constitutional: Negative.   HENT: Negative.    Cardiovascular:  Positive for chest pain.  Gastrointestinal: Negative.   Genitourinary: Negative.   Musculoskeletal: Negative.   Neurological: Negative.  Updated Vital Signs BP 136/82 (BP Location: Left Arm)   Pulse (!) 57   Temp 98.4 F (36.9 C) (Oral)   Resp 14   Ht 5' (1.524 m)   Wt 49.9 kg   LMP 02/22/2012   SpO2 96%   BMI 21.48 kg/m   Physical Exam Vitals and nursing note reviewed.  Constitutional:      Appearance: She is not ill-appearing or toxic-appearing.  HENT:     Head: Normocephalic and atraumatic.     Mouth/Throat:     Mouth: Mucous membranes are moist.     Pharynx: No oropharyngeal exudate or posterior oropharyngeal erythema.  Eyes:     General:        Right eye: No discharge.        Left eye: No discharge.     Conjunctiva/sclera:  Conjunctivae normal.  Cardiovascular:     Rate and Rhythm: Normal rate and regular rhythm.     Pulses: Normal pulses.     Heart sounds: Normal heart sounds. No murmur heard. Pulmonary:     Effort: Pulmonary effort is normal. No tachypnea, bradypnea, accessory muscle usage, prolonged expiration or respiratory distress.     Breath sounds: Normal breath sounds. No wheezing or rales.  Chest:     Chest wall: Tenderness present. No mass, lacerations, deformity, swelling or edema.    Abdominal:     General: Bowel sounds are normal. There is no distension.     Palpations: Abdomen is soft.     Tenderness: There is no abdominal tenderness.  Musculoskeletal:        General: No deformity.     Cervical back: Neck supple.     Right lower leg: No edema.     Left lower leg: No edema.  Skin:    General: Skin is warm and dry.     Capillary Refill: Capillary refill takes less than 2 seconds.  Neurological:     Mental Status: She is alert. Mental status is at baseline.  Psychiatric:        Mood and Affect: Mood normal.     (all labs ordered are listed, but only abnormal results are displayed) Labs Reviewed  BASIC METABOLIC PANEL WITH GFR - Abnormal; Notable for the following components:      Result Value   Sodium 126 (*)    Chloride 90 (*)    CO2 19 (*)    Anion gap 17 (*)    All other components within normal limits  CBC - Abnormal; Notable for the following components:   Hemoglobin 11.7 (*)    HCT 33.8 (*)    All other components within normal limits  RESP PANEL BY RT-PCR (RSV, FLU A&B, COVID)  RVPGX2  TROPONIN I (HIGH SENSITIVITY)  TROPONIN I (HIGH SENSITIVITY)    EKG: EKG Interpretation Date/Time:  Saturday January 06 2024 17:45:00 EST Ventricular Rate:  78 PR Interval:  148 QRS Duration:  72 QT Interval:  378 QTC Calculation: 430 R Axis:   79  Text Interpretation: Normal sinus rhythm Possible Left atrial enlargement Borderline ECG When compared with ECG of 05-Oct-2022  18:04, No acute changes Confirmed by Haze Lonni PARAS ([id number]) on 01/07/2024 1:58:59 AM  Radiology: [facility] Chest 2 View Result Date: 01/06/2024 EXAM: 2 VIEW(S) XRAY OF THE CHEST 01/06/2024 07:12:50 PM COMPARISON: 10/05/2022 CLINICAL HISTORY: CP/cough FINDINGS: LUNGS AND PLEURA: No focal pulmonary opacity. No pleural effusion. No pneumothorax. HEART AND MEDIASTINUM: No acute abnormality of the cardiac and mediastinal silhouettes. BONES AND SOFT  TISSUES: No acute osseous abnormality. IMPRESSION: 1. No acute cardiopulmonary process. Electronically signed by: Franky Crease MD 01/06/2024 07:16 PM EST RP Workstation: [id number]     Procedures   Medications Ordered in the ED  lidocaine  (LIDODERM ) 5 % 1 patch (1 patch Transdermal Patch Applied 01/07/24 0106)  oxyCODONE -acetaminophen  (PERCOCET/ROXICET) 5-325 MG per tablet 1 tablet (1 tablet Oral Given 01/06/24 1753)  sodium chloride  0.9 % bolus 1,000 mL (1,000 mLs Intravenous New Bag/Given 01/07/24 0101)  ketorolac  (TORADOL ) 15 MG/ML injection 15 mg (15 mg Intravenous Given 01/07/24 0102)                                    Medical Decision Making 63 year old female with left-sided chest wall pain in context of recent illness with persistent cough.  Vital signs are reassuring on intake, cardiopulmonary and abdominal exams are benign.  Chest wall tenderness palpation as above without skin changes or signs of trauma.  DDx includes but limited to muscle strain, costochondritis, pleurisy, PE, pleural effusion, pneumonia, pneumothorax, CHF, herpes zoster.   Amount and/or Complexity of Data Reviewed Labs: ordered.    Details: CBC without leukocytosis, BMP with hyponatremia without hyperglycemia. Troponin negative x 2. RVP pending.  Radiology: ordered.    Details:   Chest x-ray negative for acute cardiopulmonary disease.   Risk Prescription drug management.  HEART score of 3, low clinical suspicion for PE.   Patient clinically appears dehydrated  despite reassuring vital signs.  Will offer IV fluid resuscitation, however clinical concern for emergent underlying condition of this patient's symptoms that would warrant further ED workup and patient management is exceedingly low.,  Picture most consistent with muscular strain for persistent cough.  Symptomatic management discussed.  Betty Avery voiced understanding of her medical evaluation and treatment plan. Each of their questions answered to their expressed satisfaction.  Return precautions were given.  Patient is well-appearing, stable, and was discharged in good condition.  This chart was dictated using voice recognition software, Dragon. Despite the best efforts of this provider to proofread and correct errors, errors may still occur which can change documentation meaning.      Final diagnoses:  None    ED Discharge Orders     None         Betty Avery 01/07/24 0328    Haze Lonni PARAS, MD 01/07/24 [phone]

## 2024-03-15 ENCOUNTER — Emergency Department (HOSPITAL_COMMUNITY)

## 2024-03-15 ENCOUNTER — Emergency Department (HOSPITAL_COMMUNITY)
Admission: EM | Admit: 2024-03-15 | Discharge: 2024-03-15 | Discharge status: Against Medical Advice | Source: Ambulatory Visit | Attending: Emergency Medicine | Admitting: Emergency Medicine

## 2024-03-15 ENCOUNTER — Other Ambulatory Visit: Payer: Self-pay

## 2024-03-15 ENCOUNTER — Encounter (HOSPITAL_COMMUNITY): Payer: Self-pay | Admitting: Emergency Medicine

## 2024-03-15 ENCOUNTER — Encounter (HOSPITAL_COMMUNITY): Payer: Self-pay

## 2024-03-15 ENCOUNTER — Ambulatory Visit (HOSPITAL_COMMUNITY): Admission: EM | Admit: 2024-03-15 | Discharge: 2024-03-15 | Disposition: A | Discharge status: Home / Self Care

## 2024-03-15 DIAGNOSIS — Z5321 Procedure and treatment not carried out due to patient leaving prior to being seen by health care provider: Secondary | ICD-10-CM | POA: Insufficient documentation

## 2024-03-15 DIAGNOSIS — M549 Dorsalgia, unspecified: Secondary | ICD-10-CM | POA: Insufficient documentation

## 2024-03-15 DIAGNOSIS — R519 Headache, unspecified: Secondary | ICD-10-CM | POA: Insufficient documentation

## 2024-03-15 DIAGNOSIS — S0990XA Unspecified injury of head, initial encounter: Secondary | ICD-10-CM | POA: Diagnosis not present

## 2024-03-15 DIAGNOSIS — W19XXXA Unspecified fall, initial encounter: Secondary | ICD-10-CM | POA: Diagnosis not present

## 2024-03-15 NOTE — Discharge Instructions (Signed)
 Please go directly to the ER for further evaluation and management of your symptoms
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
## 2024-03-15 NOTE — ED Triage Notes (Signed)
 Pt st's she slipped in the snow and fell 2 days ago.  Pt c/o pain to forehead and pain all over.  Strong odor ETOH pt admits to 1 beer.  Pt has unsteady gait.
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
